# Patient Record
Sex: Male | Born: 1960 | Race: White | Hispanic: No | Marital: Single | Smoking: Current every day smoker
Health system: Southern US, Community
[De-identification: ages and names within clinical notes are randomized; demographics above are authoritative.]

## PROBLEM LIST (undated history)

## (undated) DIAGNOSIS — N200 Calculus of kidney: Secondary | ICD-10-CM

## (undated) DIAGNOSIS — J302 Other seasonal allergic rhinitis: Secondary | ICD-10-CM

## (undated) DIAGNOSIS — G43909 Migraine, unspecified, not intractable, without status migrainosus: Secondary | ICD-10-CM

## (undated) DIAGNOSIS — K219 Gastro-esophageal reflux disease without esophagitis: Secondary | ICD-10-CM

## (undated) DIAGNOSIS — K644 Residual hemorrhoidal skin tags: Secondary | ICD-10-CM

## (undated) DIAGNOSIS — I209 Angina pectoris, unspecified: Secondary | ICD-10-CM

## (undated) DIAGNOSIS — K5792 Diverticulitis of intestine, part unspecified, without perforation or abscess without bleeding: Secondary | ICD-10-CM

## (undated) DIAGNOSIS — R011 Cardiac murmur, unspecified: Secondary | ICD-10-CM

## (undated) DIAGNOSIS — M1990 Unspecified osteoarthritis, unspecified site: Secondary | ICD-10-CM

## (undated) DIAGNOSIS — F419 Anxiety disorder, unspecified: Secondary | ICD-10-CM

## (undated) DIAGNOSIS — I499 Cardiac arrhythmia, unspecified: Secondary | ICD-10-CM

## (undated) DIAGNOSIS — R0602 Shortness of breath: Secondary | ICD-10-CM

## (undated) DIAGNOSIS — R51 Headache: Secondary | ICD-10-CM

## (undated) DIAGNOSIS — J189 Pneumonia, unspecified organism: Secondary | ICD-10-CM

## (undated) DIAGNOSIS — R519 Headache, unspecified: Secondary | ICD-10-CM

## (undated) DIAGNOSIS — F32A Depression, unspecified: Secondary | ICD-10-CM

## (undated) DIAGNOSIS — F1910 Other psychoactive substance abuse, uncomplicated: Secondary | ICD-10-CM

## (undated) DIAGNOSIS — E785 Hyperlipidemia, unspecified: Secondary | ICD-10-CM

## (undated) DIAGNOSIS — F329 Major depressive disorder, single episode, unspecified: Secondary | ICD-10-CM

## (undated) HISTORY — DX: Calculus of kidney: N20.0

## (undated) HISTORY — PX: SHOULDER SURGERY: SHX246

## (undated) HISTORY — DX: Diverticulitis of intestine, part unspecified, without perforation or abscess without bleeding: K57.92

## (undated) HISTORY — PX: WISDOM TOOTH EXTRACTION: SHX21

## (undated) HISTORY — DX: Depression, unspecified: F32.A

## (undated) HISTORY — DX: Hyperlipidemia, unspecified: E78.5

## (undated) HISTORY — DX: Anxiety disorder, unspecified: F41.9

## (undated) HISTORY — DX: Other psychoactive substance abuse, uncomplicated: F19.10

---
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
## 1998-06-14 ENCOUNTER — Encounter: Payer: Self-pay | Admitting: *Deleted

## 1998-06-14 ENCOUNTER — Inpatient Hospital Stay (HOSPITAL_COMMUNITY): Admission: EM | Admit: 1998-06-14 | Discharge: 1998-06-18 | Payer: Self-pay | Admitting: *Deleted

## 1999-09-05 HISTORY — PX: APPENDECTOMY: SHX54

## 2000-01-04 ENCOUNTER — Emergency Department (HOSPITAL_COMMUNITY): Admission: EM | Admit: 2000-01-04 | Discharge: 2000-01-04 | Payer: Self-pay | Admitting: Emergency Medicine

## 2004-01-28 ENCOUNTER — Emergency Department (HOSPITAL_COMMUNITY): Admission: EM | Admit: 2004-01-28 | Discharge: 2004-01-28 | Payer: Self-pay | Admitting: *Deleted

## 2004-08-29 ENCOUNTER — Emergency Department (HOSPITAL_COMMUNITY): Admission: EM | Admit: 2004-08-29 | Discharge: 2004-08-30 | Payer: Self-pay | Admitting: Emergency Medicine

## 2004-09-06 ENCOUNTER — Emergency Department (HOSPITAL_COMMUNITY): Admission: EM | Admit: 2004-09-06 | Discharge: 2004-09-06 | Payer: Self-pay | Admitting: Emergency Medicine

## 2005-06-22 ENCOUNTER — Emergency Department (HOSPITAL_COMMUNITY): Admission: EM | Admit: 2005-06-22 | Discharge: 2005-06-22 | Payer: Self-pay | Admitting: Emergency Medicine

## 2006-01-03 ENCOUNTER — Encounter: Payer: Self-pay | Admitting: *Deleted

## 2011-01-10 ENCOUNTER — Emergency Department (HOSPITAL_COMMUNITY)
Admission: EM | Admit: 2011-01-10 | Discharge: 2011-01-10 | Disposition: A | Discharge status: Home / Self Care | Payer: Self-pay | Attending: Emergency Medicine | Admitting: Emergency Medicine

## 2011-01-10 DIAGNOSIS — M25519 Pain in unspecified shoulder: Secondary | ICD-10-CM | POA: Insufficient documentation

## 2011-01-25 ENCOUNTER — Ambulatory Visit (INDEPENDENT_AMBULATORY_CARE_PROVIDER_SITE_OTHER): Payer: Self-pay | Admitting: Family Medicine

## 2011-01-25 ENCOUNTER — Encounter: Payer: Self-pay | Admitting: Family Medicine

## 2011-01-25 VITALS — BP 134/95 | HR 69 | Ht 72.0 in | Wt 185.0 lb

## 2011-01-25 DIAGNOSIS — M25519 Pain in unspecified shoulder: Secondary | ICD-10-CM

## 2011-01-25 DIAGNOSIS — M25511 Pain in right shoulder: Secondary | ICD-10-CM

## 2011-01-25 NOTE — Progress Notes (Signed)
  Subjective:    Patient ID: Ethan Williams, male    DOB: 1961/06/25, 50 y.o.   MRN: [id number]  HPI 50 year old male new patient here for evaluation of right shoulder pain. 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 He states he's had shoulder pain on the right side on and off for many years. He thinks he has a history of subluxation on that side. His pain is generally anteriorly and laterally, and occasionally will wake him up at night. Usually a constant ache, but when he moves his arm out and behind him it will sometimes be a sharp stab. He thinks he may occasionally hear some clicking and popping. He is currently doing ibuprofen 800 mg every 6 hours as needed for pain. He has never done therapy. 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 Actually went to the emergency room recently, but did not get x-rays or pain medicine.  PMH: denies PSH: yes, but did not list Social: 40 pack/year history, recovered narcotic pain addict.  Unemployed, no insurance. All: NKDA Family Hx: HTN Meds: ibuprofen Review of Systems Denies fever, headaches, chills, sweats, weight loss, cough, hemoptysis, chest pain, abdominal pain.    Objective:   Physical Exam Gen. appearance: Well-appearing unkempt male with an odd affect Neck: Supple ENT moist mucous membranes Neuro alert and oriented Psych very out affect Lungs noted breathing Abdomen soft Right shoulder: Range of motion full with forward flexion abduction and external rotation. Internal rotation about L4. Normal rotator cuff strength. No tenderness over the supraspinatus tendon. Minimal tenderness over the biceps tendon, but definite tenderness in the anterior shoulder joint line. No obvious crepitus. No a.c. tenderness and negative cross adduction test. Mildly positive Hawkins and near test. Grossly positive O'Brien's test. Grossly positive apprehension test with mild relief on relocation. Negative sulcus  sign. Negative Clunk, mildly positive crank test. Negative speeds and Yergason's.       Assessment & Plan:  Chronic right shoulder pain with exacerbation over the last 2 weeks. Sounds like he may have a history of shoulder subluxation, and thus may have some chronic capsular and anterior labral pathology. In addition he may have some mild glenohumeral arthritis. - He declined any new anti-inflammatories or pain meds, he would like to continue ibuprofen and Tylenol as needed - Declined cortisone shot today -Check 3 views of the right shoulder mostly to assess for arthritis -Referral to physical therapy for range of motion, strength and decrease pain -follow up 1 month

## 2011-02-10 ENCOUNTER — Other Ambulatory Visit: Payer: Self-pay | Admitting: Family Medicine

## 2011-02-10 ENCOUNTER — Ambulatory Visit (HOSPITAL_COMMUNITY)
Admission: RE | Admit: 2011-02-10 | Discharge: 2011-02-10 | Disposition: A | Discharge status: Home / Self Care | Payer: Self-pay | Source: Ambulatory Visit | Attending: Sports Medicine | Admitting: Sports Medicine

## 2011-02-10 DIAGNOSIS — M25519 Pain in unspecified shoulder: Secondary | ICD-10-CM | POA: Insufficient documentation

## 2011-02-10 DIAGNOSIS — M25511 Pain in right shoulder: Secondary | ICD-10-CM

## 2011-02-10 DIAGNOSIS — M19019 Primary osteoarthritis, unspecified shoulder: Secondary | ICD-10-CM | POA: Insufficient documentation

## 2011-02-15 ENCOUNTER — Ambulatory Visit: Payer: Self-pay | Admitting: Family Medicine

## 2011-02-22 ENCOUNTER — Encounter: Payer: Self-pay | Admitting: Family Medicine

## 2011-02-22 ENCOUNTER — Ambulatory Visit (INDEPENDENT_AMBULATORY_CARE_PROVIDER_SITE_OTHER): Payer: Self-pay | Admitting: Family Medicine

## 2011-02-22 VITALS — BP 112/81

## 2011-02-22 DIAGNOSIS — M19019 Primary osteoarthritis, unspecified shoulder: Secondary | ICD-10-CM | POA: Insufficient documentation

## 2011-02-22 DIAGNOSIS — M25511 Pain in right shoulder: Secondary | ICD-10-CM

## 2011-02-22 DIAGNOSIS — M25519 Pain in unspecified shoulder: Secondary | ICD-10-CM

## 2011-02-22 DIAGNOSIS — M25819 Other specified joint disorders, unspecified shoulder: Secondary | ICD-10-CM

## 2011-02-22 NOTE — Progress Notes (Signed)
  Subjective:    Patient ID: Ethan Williams, male    DOB: 1961-03-26, 50 y.o.   MRN: [id number]  HPI 50 yo M f/u Rt shoulder.  Continues with anterior shoulder pain when working on his car.  Has h/o several prior subluxations, unsure if ever fully dislocated.  Here to go over x-rays.  Only using tylenol for pain and occasional advil.   Review of Systems No F, S, C    Objective:   Physical Exam Gen: NAD, very odd affect Rt shoulder: F flex 140 deg, abd 140 deg, ER 60 deg.  + apprehesion with pain, pain with sulcus test but no true sulcus sign.  Mild pain with anterior labral grind.  Mildly + Hawkin's/Neer's.  RC strength intact.  X-rays: AP int/ext views and Scap-Y view show ant GH osteophyte.  Type 2 acromion.  Ax Lat view not appropriately obtained.       Assessment & Plan:  Rt shoulder pain with h/o subluxation.  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 - declined CSI again today, will call back if changes his mind because of increased pain - set up for PT to focus on shoulder stability with RC strengthening - continue tylenol prn, declined other meds.  I would be hesitant to try much else given his h/o narcotic abuse. - f/u 1 month after several PT sessions, can call back sooner if wants [facility] CSI

## 2011-03-14 ENCOUNTER — Ambulatory Visit: Payer: Self-pay | Attending: Family Medicine | Admitting: Rehabilitative and Restorative Service Providers"

## 2011-03-14 DIAGNOSIS — M25519 Pain in unspecified shoulder: Secondary | ICD-10-CM | POA: Insufficient documentation

## 2011-03-14 DIAGNOSIS — IMO0001 Reserved for inherently not codable concepts without codable children: Secondary | ICD-10-CM | POA: Insufficient documentation

## 2011-03-14 DIAGNOSIS — R293 Abnormal posture: Secondary | ICD-10-CM | POA: Insufficient documentation

## 2011-03-14 DIAGNOSIS — M6281 Muscle weakness (generalized): Secondary | ICD-10-CM | POA: Insufficient documentation

## 2011-03-24 ENCOUNTER — Ambulatory Visit: Payer: Self-pay | Admitting: Family Medicine

## 2011-03-27 ENCOUNTER — Ambulatory Visit: Payer: Self-pay

## 2011-03-31 ENCOUNTER — Ambulatory Visit: Payer: Self-pay | Admitting: Family Medicine

## 2011-04-03 ENCOUNTER — Ambulatory Visit (INDEPENDENT_AMBULATORY_CARE_PROVIDER_SITE_OTHER): Payer: Self-pay | Admitting: Family Medicine

## 2011-04-03 VITALS — BP 129/82

## 2011-04-03 DIAGNOSIS — M25511 Pain in right shoulder: Secondary | ICD-10-CM

## 2011-04-03 DIAGNOSIS — M25519 Pain in unspecified shoulder: Secondary | ICD-10-CM

## 2011-04-03 NOTE — Patient Instructions (Signed)
Thank you for coming to see me today. He may have some more shoulder soreness tonight or tomorrow morning but that should wear off soon. If you have  very worsening pain, fevers or chills please let us know.  Please continue shoulder exercises that he learned in physical therapy Come back in about 4 weeks if you are all better you do not have to come back.

## 2011-04-03 NOTE — Progress Notes (Signed)
Ethan Williams presents to clinic today to followup his right shoulder pain.  He has had this pain since early May, and he has been managed conservatively with physical therapy and oral NSAIDs. He has resisted any glenohumeral or subacromial injection at this point.  He continues to have pain and does not feel like he is improving much despite the above therapy.  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  He also has pain when reaching back in reaching up.   PMH reviewed.  ROS as above otherwise neg  Exam:  BP 129/82 Gen: Well NAD Shoulder: Inspection reveals no abnormalities, atrophy or asymmetry. Palpation is normal with no tenderness over AC joint or bicipital groove. ROM is limited to abduction 160, for flexion full, external range of motion 85, internal range of motion 75. Rotator cuff strength normal throughout. Hawking's test is positive ears and empty can are negative Speeds and Yergason's tests normal. No painful arc and no drop arm sign.  INJECTION: Patient was given informed consent, signed copy in the chart. Appropriate time out was taken. Area prepped and draped in usual sterile fashion. 1 cc of kenalog plus  4 cc of lidocaine was injected into the right subacromial bursa using a(n) posterior approach. The patient tolerated the procedure well. There were no complications. Post procedure instructions were given.     A/P: Chronic right shoulder pain. He has not had much relief from physical therapy. He does have some signs of impingement. We will try subacromial injection today. If that does not improve his symptoms then he may be able to consider orthopedic referral.

## 2011-04-04 ENCOUNTER — Ambulatory Visit: Payer: Self-pay

## 2011-04-06 ENCOUNTER — Ambulatory Visit: Payer: Self-pay | Attending: Family Medicine

## 2011-04-06 DIAGNOSIS — M25519 Pain in unspecified shoulder: Secondary | ICD-10-CM | POA: Insufficient documentation

## 2011-04-06 DIAGNOSIS — R293 Abnormal posture: Secondary | ICD-10-CM | POA: Insufficient documentation

## 2011-04-06 DIAGNOSIS — IMO0001 Reserved for inherently not codable concepts without codable children: Secondary | ICD-10-CM | POA: Insufficient documentation

## 2011-04-06 DIAGNOSIS — M6281 Muscle weakness (generalized): Secondary | ICD-10-CM | POA: Insufficient documentation

## 2011-04-12 ENCOUNTER — Ambulatory Visit: Payer: Self-pay | Admitting: Physical Therapy

## 2011-04-13 ENCOUNTER — Ambulatory Visit: Payer: Self-pay | Admitting: Physical Therapy

## 2011-04-17 ENCOUNTER — Ambulatory Visit: Payer: Self-pay | Admitting: Family Medicine

## 2011-04-18 ENCOUNTER — Ambulatory Visit: Payer: Self-pay

## 2011-04-20 ENCOUNTER — Ambulatory Visit (INDEPENDENT_AMBULATORY_CARE_PROVIDER_SITE_OTHER): Payer: Self-pay | Admitting: Sports Medicine

## 2011-04-20 ENCOUNTER — Encounter: Payer: Self-pay | Admitting: Sports Medicine

## 2011-04-20 DIAGNOSIS — M25519 Pain in unspecified shoulder: Secondary | ICD-10-CM

## 2011-04-20 DIAGNOSIS — R51 Headache: Secondary | ICD-10-CM

## 2011-04-20 DIAGNOSIS — R519 Headache, unspecified: Secondary | ICD-10-CM | POA: Insufficient documentation

## 2011-04-20 DIAGNOSIS — M25511 Pain in right shoulder: Secondary | ICD-10-CM

## 2011-04-20 DIAGNOSIS — Z0000 Encounter for general adult medical examination without abnormal findings: Secondary | ICD-10-CM | POA: Insufficient documentation

## 2011-04-20 MED ORDER — NAPROXEN SODIUM 220 MG PO CAPS: 2.0000 | ORAL_CAPSULE | Freq: Two times a day (BID) | ORAL | Status: AC

## 2011-04-20 NOTE — Assessment & Plan Note (Signed)
Will discuss lipid screening and colonoscopy at next visit.

## 2011-04-20 NOTE — Progress Notes (Signed)
Subjective:  [name] is here today to establish care and to have his headaches evaluated.  He reports otherwise being healthy but has had persistent headaches over the past couple of years intermittently but seem to have gotten worse on the past month to 6 weeks.  In this time he has begun PT for a right shoulder injury and notices that the headaches are worse in the following days after PT.  He has been taking tylenol 500mg  up to 6 times daily with minimal relief.  He has not been able to find found anything else that seems to help them either.  Has not tried ice, heat, stretching or NSAIDS.  He describes the headaches a focally located in the right occipital region and has had some associated double vision when they are bad and a generalized feeling of dizziness although he does not report any vertigo or perceived motion.  The dizziness sounds to be more of a dysphoric feeling compared to a true vestibular or pre-syncopal sensation.  ROS:    Denies: Chest pain, dyspnea, diaphoresis; syncope, pre-syncope or vestibular symptoms.  Denies changes in hearing, smell or taste; + Double vision as above, Denies diarrhea; + constipation with occasional rectal bleeding after straining.  Otherwise no melana or bright red blood per rectum.  No dysuria, frequency.  No polydypsia or polyuria.    Physical Exam: GENERAL:  Middle aged  Caucasian male, examined in [facility].  Alert and interactive but with flattened affect and no eye contact and distant conversationalist.  In no distress. HNEENT: PERRLA, extra ocular movement intact and oropharynx clear, no lesions THORAX: HEART: S1, S2 normal, no murmur, rub or gallop, regular rate and rhythm LUNGS: clear to auscultation, no wheezes or rales and unlabored breathing ABDOMEN:  abdomen is soft without significant tenderness, masses, organomegaly or guarding EXTREMITIES: extremities normal, atraumatic, no cyanosis or edema >NEURO normal without focal findings, mental  status, speech normal, alert and oriented x3, PERLA and reflexes normal and symmetric >MSK/Osteopathic: R UE: + speeds test, negative empty can, would allow me to adequately examine the remaining shoulder for fear or further injury.   Cervical:  Tenderness over the Greater Occipital Nerve.  + Tinnel's there with mild scalp radiation.  Para spinal tenderness on R side; would not allow for full exam as not interested in receiving OMT at this time.   Ribs: elevated L 1st rib

## 2011-04-20 NOTE — Patient Instructions (Signed)
It was nice meeting you today.  Your headache is most likely due to some muscle soreness and inflammation in the upper part of your neck.  I would like you use heat on your neck for 10 minutes with stretching and follow up with ice to your neck for 15 mins after than.  Please take 2 Aleeve (naproxen) 220mg  tablets twice a day for the next 2 weeks and see how you feel.  Please return in 1 month so we can continue discussing your medical care.

## 2011-04-20 NOTE — Assessment & Plan Note (Signed)
Continue PT and try, ICE with NSAID

## 2011-04-20 NOTE — Assessment & Plan Note (Signed)
Given directions for taking Aleeve 2 X 220mg  po bid X 14 days.  Try heat and stretching followed by ice.. OMT declined at this time

## 2011-04-25 ENCOUNTER — Ambulatory Visit: Payer: Self-pay

## 2011-04-27 ENCOUNTER — Ambulatory Visit: Payer: Self-pay

## 2011-05-03 ENCOUNTER — Ambulatory Visit: Payer: Self-pay

## 2011-05-05 ENCOUNTER — Ambulatory Visit (INDEPENDENT_AMBULATORY_CARE_PROVIDER_SITE_OTHER): Payer: Self-pay | Admitting: Family Medicine

## 2011-05-05 VITALS — BP 120/70

## 2011-05-05 DIAGNOSIS — M25511 Pain in right shoulder: Secondary | ICD-10-CM

## 2011-05-05 DIAGNOSIS — M25519 Pain in unspecified shoulder: Secondary | ICD-10-CM

## 2011-05-08 ENCOUNTER — Encounter: Payer: Self-pay | Admitting: Family Medicine

## 2011-05-08 NOTE — Progress Notes (Signed)
  Subjective:    Patient ID: Ethan Williams, male    DOB: Jan 06, 1961, 49 y.o.   MRN: [id number]  HPI Followup right shoulder pain. He has continued in physical therapy and feels his strength in his range of motion is much better. He still has pain with certain motions particularly crossing the arm over across his chest and doing anything above his head. He had no numbness in his hand.  Corticosteroid injection did not seem to help much more than 2 days. Review of Systems    denies any new symptoms in the right upper extremity. No fever weight loss. Objective:   Physical Exam   Right shoulder has full range of motion. He has positive O'Brien's and positive impingement signs. Distally he is neurovascularly intact.     Assessment & Plan:  Continued shoulder pain but much improved range of motion and strength. I would like to continue his physical therapy for another 2-3 weeks and see him back. The injection did not help him.

## 2011-05-19 ENCOUNTER — Emergency Department (HOSPITAL_COMMUNITY)
Admission: EM | Admit: 2011-05-19 | Discharge: 2011-05-19 | Discharge status: Against Medical Advice | Payer: Self-pay | Attending: Emergency Medicine | Admitting: Emergency Medicine

## 2011-05-19 DIAGNOSIS — R079 Chest pain, unspecified: Secondary | ICD-10-CM | POA: Insufficient documentation

## 2011-05-19 DIAGNOSIS — R21 Rash and other nonspecific skin eruption: Secondary | ICD-10-CM | POA: Insufficient documentation

## 2011-05-20 ENCOUNTER — Emergency Department (HOSPITAL_COMMUNITY): Payer: Self-pay

## 2011-05-20 ENCOUNTER — Emergency Department (HOSPITAL_COMMUNITY)
Admission: EM | Admit: 2011-05-20 | Discharge: 2011-05-20 | Disposition: A | Discharge status: Home / Self Care | Payer: Self-pay | Attending: Emergency Medicine | Admitting: Emergency Medicine

## 2011-05-20 DIAGNOSIS — R079 Chest pain, unspecified: Secondary | ICD-10-CM | POA: Insufficient documentation

## 2011-05-20 DIAGNOSIS — F411 Generalized anxiety disorder: Secondary | ICD-10-CM | POA: Insufficient documentation

## 2011-05-20 DIAGNOSIS — R51 Headache: Secondary | ICD-10-CM | POA: Insufficient documentation

## 2011-05-20 DIAGNOSIS — R0602 Shortness of breath: Secondary | ICD-10-CM | POA: Insufficient documentation

## 2011-05-20 DIAGNOSIS — J189 Pneumonia, unspecified organism: Secondary | ICD-10-CM | POA: Insufficient documentation

## 2011-05-20 LAB — COMPREHENSIVE METABOLIC PANEL
ALT: 29 U/L (ref 0–53)
Alkaline Phosphatase: 67 U/L (ref 39–117)
BUN: 12 mg/dL (ref 6–23)
CO2: 30 mEq/L (ref 19–32)
Chloride: 100 mEq/L (ref 96–112)
GFR calc Af Amer: >60 mL/min (ref >60)
Glucose, Bld: 116 mg/dL — ABNORMAL HIGH (ref 70–99)
Potassium: 4.6 mEq/L (ref 3.5–5.1)
Sodium: 138 mEq/L (ref 135–145)
Total Bilirubin: 0.2 mg/dL — ABNORMAL LOW (ref 0.3–1.2)

## 2011-05-20 LAB — POCT I-STAT TROPONIN I: Troponin i, poc: 0.01 ng/mL (ref 0.00–0.08)

## 2011-05-20 LAB — DIFFERENTIAL
Basophils Relative: 0 % (ref 0–1)
Eosinophils Absolute: 0.3 10*3/uL (ref 0.0–0.7)
Eosinophils Relative: 3 % (ref 0–5)
Monocytes Absolute: 0.7 10*3/uL (ref 0.1–1.0)
Monocytes Relative: 8 % (ref 3–12)
Neutrophils Relative %: 58 % (ref 43–77)

## 2011-05-20 LAB — CBC
MCH: 29.3 pg (ref 26.0–34.0)
MCHC: 34 g/dL (ref 30.0–36.0)
MCV: 86.1 fL (ref 78.0–100.0)
Platelets: 245 10*3/uL (ref 150–400)
RBC: 5.4 MIL/uL (ref 4.22–5.81)
RDW: 13.5 % (ref 11.5–15.5)

## 2011-05-22 ENCOUNTER — Encounter: Payer: Self-pay | Admitting: Sports Medicine

## 2011-05-22 ENCOUNTER — Ambulatory Visit (INDEPENDENT_AMBULATORY_CARE_PROVIDER_SITE_OTHER): Payer: Self-pay | Admitting: Sports Medicine

## 2011-05-22 VITALS — BP 150/95 | HR 80 | Temp 97.5°F | Ht 66.0 in | Wt 197.3 lb

## 2011-05-22 DIAGNOSIS — Z0130 Encounter for examination of blood pressure without abnormal findings: Secondary | ICD-10-CM | POA: Insufficient documentation

## 2011-05-22 DIAGNOSIS — IMO0001 Reserved for inherently not codable concepts without codable children: Secondary | ICD-10-CM

## 2011-05-22 DIAGNOSIS — K029 Dental caries, unspecified: Secondary | ICD-10-CM | POA: Insufficient documentation

## 2011-05-22 DIAGNOSIS — R51 Headache: Secondary | ICD-10-CM

## 2011-05-22 DIAGNOSIS — Z23 Encounter for immunization: Secondary | ICD-10-CM

## 2011-05-22 DIAGNOSIS — R030 Elevated blood-pressure reading, without diagnosis of hypertension: Secondary | ICD-10-CM

## 2011-05-22 DIAGNOSIS — J189 Pneumonia, unspecified organism: Secondary | ICD-10-CM

## 2011-05-22 DIAGNOSIS — M25819 Other specified joint disorders, unspecified shoulder: Secondary | ICD-10-CM

## 2011-05-22 DIAGNOSIS — M19019 Primary osteoarthritis, unspecified shoulder: Secondary | ICD-10-CM

## 2011-05-22 DIAGNOSIS — Z0000 Encounter for general adult medical examination without abnormal findings: Secondary | ICD-10-CM

## 2011-05-22 NOTE — Assessment & Plan Note (Signed)
Will refer to dental via [facility] card

## 2011-05-22 NOTE — Assessment & Plan Note (Signed)
Dx @ [facility] ED. Finish Azithromycin. Improving clinically

## 2011-05-22 NOTE — Patient Instructions (Signed)
It was great to see you today.  I am glad that you are feeling better.  We discussed your shoulder pain today and you said that you have been able to do your exercises 2-3 times per week.  This is great but as you pointed out if you were able to do them more you will likely get more strength and increase your range of motion quicker and decrease your pain even more than it is now.  Keep taking the Aleeve for now but if you feel like you are able to ween off of it that would be fine.  If you itching or your rash comes back try taking an over the counter Loratadine (Claratin) as directed on the package.  This will be better than the benadryl for not causing any drowsiness.  Please call next week to schedule a nurse visit to have your blood drawn for a fasting lipid panel to check you cholesterol and your triglycerides.  We will also check your BP at this nurse visit.  We will work on getting your reffered to [facility] for a conolonscopy although there is a waiting list.  We will also refer to the dentist on the [facility] card.  Please come back in 3 months so we can follow up with each other to see how your problems are doing.

## 2011-05-22 NOTE — Assessment & Plan Note (Addendum)
Will refer for screening colonoscopy per [facility] card Fasting lipid panel ordered

## 2011-05-22 NOTE — Assessment & Plan Note (Deleted)
Continue 2 Aleeve po bid for now but try self directed weening. States he will continue exercises 2-3Xs per week. Understands we would like him to do daily.   Not interested in OMT

## 2011-05-22 NOTE — Progress Notes (Addendum)
Pt here for follow up to discuss Colonoscopy, Dental Work, a resolving rash with change in bowel habits and his ongoing shoulder pain.  He was also just recently seen in the ED 3 days prior to the visit and was dx with CAP and placed on Azithromycin.   CAP - symptoms improved since initiation of ABX.  Was having frequent substernal chest pain.  Was found to have a L basilar infiltrate/atelectasis and negative ECG/CEs.  Azithromycin has greatly reduced his symptoms and is now only having ~ 1 episode of chest pain that last 10-15 seconds.    Rash/Bowel Changes:  He reports that he first starting noticing a slight changing of of his stools ~2 weeks ago followed by the appearance of a rash.  He stated the rash was pruritic and that he ended up needing to cut his hair so that he could apply anti-itch cream to his scalp.  He did not try any other meds consistently but reported using benadryl X 1 but was hesitant to use it because he didn't know if he should.  The rash has improved at this time and his loose stools had improved but have now worsened again following initiation of the anti-biotics for his CAP.  No hematachezia or melana.  Headaches/Shoulder Pain:  Much improved, states that ROM is improved and that it is less limiting.  Headaches have decreased in frequency and duration.  Been compliant with OTC Aleeve 2 pills BID.  Had shoulder injected at [facility] and also showed some improvement.  Has been doing exercsises 2-3Xs / week.    Health Maintance:  Would like referral for colonoscopy and dental work through [facility] card.    Dental Caries:  States that he has been having some dental pain that has worsened over the last couple of months.  PE: GENERAL:  adult Caucasian male, examined in [facility].  Alert and appropriate.  In no discomfort; no respiratory distress. HNEENT: PERRLA, extra ocular movement intact, sclera clear, anicteric, oropharynx clear, no lesions and dental fillings noted with multiple areas of  visible caries on upper and lower molars bilaterally; no dental abcess apprecaited THORAX: HEART: S1, S2 normal, no murmur, rub or gallop, regular rate and rhythm LUNGS: clear to auscultation, no wheezes or rales and unlabored breathing ABDOMEN:  abdomen is soft without significant tenderness, masses, organomegaly or guarding EXTREMITIES: warm well perfused, no cyanosis, or edema; shoulder ROM equal B with exception of R arm internal rotation (T4 on L vs T8 on R);  Strength 5+/5 in UE with exception of R shoulder extension @ 5-/

## 2011-05-22 NOTE — Assessment & Plan Note (Signed)
Will recheck at nurse visit when comes in for fasting lipid panel

## 2011-05-22 NOTE — Assessment & Plan Note (Signed)
Continue 2 Aleeve po bid for now but try self directed weening. States he will continue exercises 2-3Xs per week. Understands we would like him to do daily.   Not interested in OMT 

## 2011-06-05 ENCOUNTER — Other Ambulatory Visit: Payer: Self-pay

## 2011-06-05 ENCOUNTER — Ambulatory Visit (INDEPENDENT_AMBULATORY_CARE_PROVIDER_SITE_OTHER): Payer: Self-pay | Admitting: Sports Medicine

## 2011-06-05 ENCOUNTER — Encounter: Payer: Self-pay | Admitting: Sports Medicine

## 2011-06-05 VITALS — BP 129/83 | HR 74 | Temp 98.2°F | Ht 72.0 in | Wt 195.0 lb

## 2011-06-05 DIAGNOSIS — M9908 Segmental and somatic dysfunction of rib cage: Secondary | ICD-10-CM | POA: Insufficient documentation

## 2011-06-05 DIAGNOSIS — R079 Chest pain, unspecified: Secondary | ICD-10-CM | POA: Insufficient documentation

## 2011-06-05 DIAGNOSIS — M25511 Pain in right shoulder: Secondary | ICD-10-CM

## 2011-06-05 DIAGNOSIS — M9902 Segmental and somatic dysfunction of thoracic region: Secondary | ICD-10-CM

## 2011-06-05 DIAGNOSIS — M25519 Pain in unspecified shoulder: Secondary | ICD-10-CM

## 2011-06-05 DIAGNOSIS — M9907 Segmental and somatic dysfunction of upper extremity: Secondary | ICD-10-CM | POA: Insufficient documentation

## 2011-06-05 DIAGNOSIS — Z0000 Encounter for general adult medical examination without abnormal findings: Secondary | ICD-10-CM

## 2011-06-05 DIAGNOSIS — M999 Biomechanical lesion, unspecified: Secondary | ICD-10-CM

## 2011-06-05 DIAGNOSIS — F17200 Nicotine dependence, unspecified, uncomplicated: Secondary | ICD-10-CM | POA: Insufficient documentation

## 2011-06-05 DIAGNOSIS — R030 Elevated blood-pressure reading, without diagnosis of hypertension: Secondary | ICD-10-CM

## 2011-06-05 LAB — LIPID PANEL
Cholesterol: 194 mg/dL (ref 0–200)
Total CHOL/HDL Ratio: 5.7 Ratio

## 2011-06-05 MED ORDER — MELOXICAM 15 MG PO TABS: 15.0000 mg | ORAL_TABLET | Freq: Every day | ORAL | Status: DC

## 2011-06-05 NOTE — Progress Notes (Signed)
Items to discuss today:  Right sided chest pain & R shoulder pain, BP and smoking  R Sided Chest and Shoulder pain:  He reports some worsening of symptoms over the past 2 weeks and is now having on average 2 episodes per day of a dull right sided chest pain that is stabbing like on occasion; does have some R epigastric radiation.  These episodes last anywhere from a couple of minutes to greater than 1 hour.  He reports some diaphoresis and dyspnea with these episodes.  Occurs both at rest and during exercise but not necessarily worsened with exertion.  Massage and stretching help relieve these episodes although he had some longer episodes that these techniques do not alleviate.  Has had prior stress test years ago that was negative.  Is a current smoker.  Not hypertensive or diabetic and lipid panel is pending.  No past cardiac history.  ECG performed 9/15 showed no acute ECG changes.    BP: is good today; relates his prior elevated readings to his prior pain and situational anxiety related to being at the doctors office  Smoking:  Would like to quit but not ready too yet; has not set quit date but considering it.    PE: GENERAL:  Middle aged  Caucasian male, examined in [facility].  Alert and Appropriately Interactive.  In no discomfort; no respiratory distress. HNEENT: PERRLA, extra ocular movement intact and sclera clear, anicteric THORAX: HEART: S1, S2 normal, no murmur, rub or gallop, regular rate and rhythm LUNGS: clear to auscultation, no wheezes or rales and unlabored breathing EXTREMITIES: extremities normal, atraumatic, no cyanosis or edema  MSK/Osteopathic:    Thoracic & Ribs:  Posterior R Rib 5 with associated Anterior tender point; T2-T5  NSLRR (neutral sidebent left rotated right)  R UE:  Shoulder decreased ROM; exam limited by pain no motion restriction appreciated with onset of pain other than apprehension

## 2011-06-05 NOTE — Patient Instructions (Signed)
Great to see you again today.  I am reassured that your chest pain is not coming from your heart and that it is due to a musculetoskeletal cause.  However, I do feel that you are a good candidate to do an exercise stress test to make sure your heart is not under any strain.  I also want to start you on Mobic for your shoulder and to help with your chest pain.  It is waiting at [facility] for you.  Please follow up with Dr. Jennette Kettle in sports medicine for consideration of another injection and or more physical therapy.  Please follow up with me in 3 months as previously discussed.  Please have the front desk schedule a treadmill stress test with either Dr. Jennette Kettle, McDiarmid or Fields.

## 2011-06-05 NOTE — Assessment & Plan Note (Signed)
OMT Performed to UE, Thorax and Ribs; CS, indirect MFR Pt tolerated procedure well although did have 1 intracostal muscle spasm with attempt of Thoracic ME

## 2011-06-05 NOTE — Assessment & Plan Note (Signed)
Not felt to be Hypertension; situational; continue to monitor

## 2011-06-05 NOTE — Progress Notes (Signed)
FLP DONE TODAY Saarah Dewing 

## 2011-06-05 NOTE — Assessment & Plan Note (Signed)
Discussed the availability of resources to help him quite when he decides he is ready

## 2011-06-05 NOTE — Assessment & Plan Note (Signed)
Due to character of pain and atypical presentation for cardiac origin and corroborating musculoskeletal symptomatolgy this is a low likely hood of cardiac chest pain.  However due to the duration of the symptoms and the associated diaphoresis and dyspnea with occasional activity associate it is felt that he would benefit from an exercise stress test to rule out exertional angina.  He is a relative low risk although he does have a positive smoking history; no DM, HTN or dyslipidemia.  There are no other contraindications to the procedure.  Baseline ECG from sept 15 showed no acute changes.

## 2011-06-12 ENCOUNTER — Ambulatory Visit: Payer: Self-pay | Admitting: Family Medicine

## 2011-06-19 ENCOUNTER — Ambulatory Visit (INDEPENDENT_AMBULATORY_CARE_PROVIDER_SITE_OTHER): Payer: Self-pay | Admitting: Family Medicine

## 2011-06-19 ENCOUNTER — Encounter: Payer: Self-pay | Admitting: Family Medicine

## 2011-06-19 VITALS — BP 134/90 | HR 92

## 2011-06-19 DIAGNOSIS — M25511 Pain in right shoulder: Secondary | ICD-10-CM

## 2011-06-19 DIAGNOSIS — M25519 Pain in unspecified shoulder: Secondary | ICD-10-CM

## 2011-06-19 NOTE — Patient Instructions (Signed)
F/u prn

## 2011-06-19 NOTE — Progress Notes (Signed)
  Subjective:    Patient ID: Ethan Williams, male    DOB: 1961-05-23, 50 y.o.   MRN: [id number]  HPI  Right shoulder pain. Significantly improved after the physical therapy. He was working on his car a few days later and had an exacerbation of his pain. This time the pain exacerbation the last 3 days in he was back to his post therapy baseline. He is quite excited about this.  Review of Systems    denies fever, denies unexpected weight change. No numbness in his hands. Objective:   Physical Exam GENERAL: Well-developed male no acute distress SHOULDER : Right. Full range of motion in all planes of the rotator cuff. Intact rotatory cuff strength in all planes. Distally neurovascular intact. Negative apprehension test.       Assessment & Plan:  Chronic right shoulder pain it seems significantly improved after physical therapy. I  reminded him to do his exercises 3 times a week to prevent recurrence. He can followup when necessary with me.

## 2011-12-14 ENCOUNTER — Emergency Department (HOSPITAL_COMMUNITY): Payer: Self-pay

## 2011-12-14 ENCOUNTER — Emergency Department (HOSPITAL_COMMUNITY)
Admission: EM | Admit: 2011-12-14 | Discharge: 2011-12-14 | Disposition: A | Discharge status: Home / Self Care | Payer: Self-pay | Attending: Emergency Medicine | Admitting: Emergency Medicine

## 2011-12-14 ENCOUNTER — Encounter (HOSPITAL_COMMUNITY): Payer: Self-pay | Admitting: *Deleted

## 2011-12-14 DIAGNOSIS — R51 Headache: Secondary | ICD-10-CM | POA: Insufficient documentation

## 2011-12-14 DIAGNOSIS — D72829 Elevated white blood cell count, unspecified: Secondary | ICD-10-CM | POA: Insufficient documentation

## 2011-12-14 DIAGNOSIS — H538 Other visual disturbances: Secondary | ICD-10-CM | POA: Insufficient documentation

## 2011-12-14 DIAGNOSIS — F17200 Nicotine dependence, unspecified, uncomplicated: Secondary | ICD-10-CM | POA: Insufficient documentation

## 2011-12-14 DIAGNOSIS — F29 Unspecified psychosis not due to a substance or known physiological condition: Secondary | ICD-10-CM | POA: Insufficient documentation

## 2011-12-14 DIAGNOSIS — R079 Chest pain, unspecified: Secondary | ICD-10-CM | POA: Insufficient documentation

## 2011-12-14 LAB — DIFFERENTIAL
Eosinophils Absolute: 0.1 10*3/uL (ref 0.0–0.7)
Eosinophils Relative: 1 % (ref 0–5)
Lymphocytes Relative: 21 % (ref 12–46)
Lymphs Abs: 2.5 10*3/uL (ref 0.7–4.0)
Monocytes Relative: 7 % (ref 3–12)
Neutrophils Relative %: 71 % (ref 43–77)

## 2011-12-14 LAB — COMPREHENSIVE METABOLIC PANEL
AST: 16 U/L (ref 0–37)
Albumin: 4.2 g/dL (ref 3.5–5.2)
Alkaline Phosphatase: 51 U/L (ref 39–117)
Chloride: 98 mEq/L (ref 96–112)
Potassium: 3.7 mEq/L (ref 3.5–5.1)
Total Bilirubin: 0.5 mg/dL (ref 0.3–1.2)

## 2011-12-14 LAB — CBC
Hemoglobin: 14.6 g/dL (ref 13.0–17.0)
MCH: 28.6 pg (ref 26.0–34.0)
MCV: 84.3 fL (ref 78.0–100.0)
Platelets: 292 10*3/uL (ref 150–400)
RBC: 5.11 MIL/uL (ref 4.22–5.81)
WBC: 12.3 10*3/uL — ABNORMAL HIGH (ref 4.0–10.5)

## 2011-12-14 MED ORDER — LORAZEPAM 2 MG/ML IJ SOLN
1.0000 mg | Freq: Once | INTRAMUSCULAR | Status: AC
  Administered 2011-12-14: 1 mg via INTRAVENOUS
  Filled 2011-12-14: qty 1

## 2011-12-14 MED ORDER — BUTALBITAL-APAP-CAFFEINE 50-325-40 MG PO TABS: 1.0000 | ORAL_TABLET | Freq: Four times a day (QID) | ORAL | Status: DC | PRN

## 2011-12-14 NOTE — ED Provider Notes (Signed)
History     CSN: [id number]  Arrival date & time 12/14/11  1441   First MD Initiated Contact with Patient 12/14/11 1558      Chief Complaint  Patient presents with  . Blurred Vision    h/a, slurred speech    (Consider location/radiation/quality/duration/timing/severity/associated sxs/prior treatment) HPI The patient presents with concern over a headache, chest pain, blurred vision.  He notes that the symptoms began gradually 2 days ago.  He was watching a movie, felt unwell and stepped outside to rest.  The headache and chest pain eased spontaneously over the next few hours.  The blurred vision persisted, though is improved today.  He denies any ongoing dyspnea, ataxia, discoordination, visual acuity loss. No new medications, no new activities, no new diet. The patient has a history of multiple prior similar events, occurring with spontaneous onset every couple of months.  He's never been evaluated for these.  He is a smoker, and former drinker, former substance abuser.   History reviewed. No pertinent past medical history.  Past Surgical History  Procedure Date  . Appendectomy 2001    Family History  Problem Relation Age of Onset  . Cancer Father     History  Substance Use Topics  . Smoking status: Current Everyday Smoker -- 1.0 packs/day for 37 years    Types: Cigarettes  . Smokeless tobacco: Not on file  . Alcohol Use: No     History of abuse - clean 2+ years      Review of Systems  Constitutional:       Per HPI, otherwise negative  HENT:       Per HPI, otherwise negative  Eyes: Negative.   Respiratory:       Per HPI, otherwise negative  Cardiovascular:       Per HPI, otherwise negative  Gastrointestinal: Negative for vomiting.  Genitourinary: Negative.   Musculoskeletal:       Per HPI, otherwise negative  Skin: Negative.   Neurological: Negative for syncope.    Allergies  Review of patient's allergies indicates no known allergies.  Home Medications     Current Outpatient Rx  Name Route Sig Dispense Refill  . ACETAMINOPHEN 500 MG PO TABS Oral Take 500 mg by mouth every 4 (four) hours as needed.        BP 119/75  Pulse 77  Temp(Src) 98.2 F (36.8 C) (Oral)  Resp 16  Wt 190 lb (86.183 kg)  SpO2 98%  Physical Exam  Nursing note and vitals reviewed. Constitutional: He is oriented to person, place, and time. He appears well-developed. No distress.  HENT:  Head: Normocephalic and atraumatic.  Eyes: Conjunctivae and EOM are normal.  Cardiovascular: Normal rate and regular rhythm.   Pulmonary/Chest: Effort normal. No stridor. No respiratory distress.  Abdominal: He exhibits no distension.  Musculoskeletal: He exhibits no edema.  Neurological: He is alert and oriented to person, place, and time. He has normal strength. No cranial nerve deficit or sensory deficit. Coordination and gait normal.  Skin: Skin is warm and dry.  Psychiatric: He has a normal mood and affect.    ED Course  Procedures (including critical care time)   Labs Reviewed  CBC  DIFFERENTIAL  COMPREHENSIVE METABOLIC PANEL  TROPONIN I   No results found.   No diagnosis found.   monitor 75 sinus rhythm normal Pulse oximetry 98% room air normal  5:08 PM I discussed the CT w Dr. Constance Goltz (radiology).  Today's study is different from baseline.  The  patient will have MR / MRA.  7:45 PM I informed the patient of the negative MR I. results, we discussed the possibilities of his condition, including migraines.  At this point, and never previously, the patient noted that he had previously been told he might have migraine headaches.  He has no new complaints. MDM  This patient presents with concern over blurred vision, headache.  On my exam patient is in no distress with no discernible neurologic deficiencies.  The patient initially denies any history of migraines, other notable medical problems.  However, given the patient's endorsement of intermittent similar  episodes migraine headache was in early consideration.  Given the absence of significant prior evaluation the patient had CAT scan, which was abnormal.  A followup MRI/MRA was unremarkable.  The patient's labs were otherwise reassuring aside from mild leukocytosis.  On repeat evaluation the patient has a symptomatic, laying comfortably.  We discussed the need for continued evaluation via neurology.  The patient noted that he had previously been told he may have migraine headaches, though he's not currently taking any medication for these.  The patient was discharged in stable condition to follow up with neurology.    Gerhard Munch, MD 12/14/11 [phone]

## 2011-12-14 NOTE — ED Notes (Signed)
Pt reports blurred vision, h/a, and slurred vision x 2 days ago.  Pt reports this has happened before.  Pt's speech is clear at present.  Pt reports "light" vision, and lightheadedness at this time.

## 2011-12-14 NOTE — Progress Notes (Signed)
Pt confirms [employer] [facility] [employer] as pcp Pt listed as self pay with no insurance coverage Pt confirms he is self pay [place] resident CM and [facility] community liaison spoke with him Pt offered [facility] services to assist with finding a [place] self pay provider Pt accepted information

## 2011-12-14 NOTE — ED Notes (Signed)
States blurred vision resolved-would like to have his headaches evaluated

## 2012-06-06 ENCOUNTER — Observation Stay (HOSPITAL_COMMUNITY)
Admission: EM | Admit: 2012-06-06 | Discharge: 2012-06-07 | Disposition: A | Discharge status: Home / Self Care | Payer: Self-pay | Attending: Family Medicine | Admitting: Family Medicine

## 2012-06-06 ENCOUNTER — Emergency Department (HOSPITAL_COMMUNITY): Payer: Self-pay

## 2012-06-06 ENCOUNTER — Encounter (HOSPITAL_COMMUNITY): Payer: Self-pay | Admitting: Emergency Medicine

## 2012-06-06 DIAGNOSIS — Z7982 Long term (current) use of aspirin: Secondary | ICD-10-CM | POA: Insufficient documentation

## 2012-06-06 DIAGNOSIS — R079 Chest pain, unspecified: Secondary | ICD-10-CM

## 2012-06-06 DIAGNOSIS — R002 Palpitations: Secondary | ICD-10-CM

## 2012-06-06 DIAGNOSIS — R0602 Shortness of breath: Secondary | ICD-10-CM | POA: Insufficient documentation

## 2012-06-06 DIAGNOSIS — F17200 Nicotine dependence, unspecified, uncomplicated: Secondary | ICD-10-CM

## 2012-06-06 DIAGNOSIS — M940 Chondrocostal junction syndrome [Tietze]: Secondary | ICD-10-CM

## 2012-06-06 DIAGNOSIS — Z23 Encounter for immunization: Secondary | ICD-10-CM | POA: Insufficient documentation

## 2012-06-06 DIAGNOSIS — K625 Hemorrhage of anus and rectum: Secondary | ICD-10-CM | POA: Insufficient documentation

## 2012-06-06 DIAGNOSIS — M19019 Primary osteoarthritis, unspecified shoulder: Secondary | ICD-10-CM

## 2012-06-06 DIAGNOSIS — R0789 Other chest pain: Principal | ICD-10-CM | POA: Insufficient documentation

## 2012-06-06 HISTORY — DX: Pneumonia, unspecified organism: J18.9

## 2012-06-06 HISTORY — DX: Shortness of breath: R06.02

## 2012-06-06 HISTORY — DX: Headache: R51

## 2012-06-06 HISTORY — DX: Other seasonal allergic rhinitis: J30.2

## 2012-06-06 HISTORY — DX: Headache, unspecified: R51.9

## 2012-06-06 HISTORY — DX: Angina pectoris, unspecified: I20.9

## 2012-06-06 HISTORY — DX: Gastro-esophageal reflux disease without esophagitis: K21.9

## 2012-06-06 HISTORY — DX: Cardiac murmur, unspecified: R01.1

## 2012-06-06 HISTORY — DX: Cardiac arrhythmia, unspecified: I49.9

## 2012-06-06 HISTORY — DX: Unspecified osteoarthritis, unspecified site: M19.90

## 2012-06-06 HISTORY — DX: Migraine, unspecified, not intractable, without status migrainosus: G43.909

## 2012-06-06 HISTORY — DX: Residual hemorrhoidal skin tags: K64.4

## 2012-06-06 LAB — TROPONIN I: Troponin I: <0.3 ng/mL (ref <0.30)

## 2012-06-06 LAB — CBC
HCT: 44.2 % (ref 39.0–52.0)
MCH: 29.8 pg (ref 26.0–34.0)
MCHC: 35.5 g/dL (ref 30.0–36.0)
MCV: 83.9 fL (ref 78.0–100.0)
Platelets: 242 10*3/uL (ref 150–400)
RDW: 13.7 % (ref 11.5–15.5)
WBC: 11.5 10*3/uL — ABNORMAL HIGH (ref 4.0–10.5)

## 2012-06-06 LAB — CBC WITH DIFFERENTIAL/PLATELET
Basophils Absolute: 0.1 10*3/uL (ref 0.0–0.1)
Basophils Relative: 1 % (ref 0–1)
Eosinophils Relative: 2 % (ref 0–5)
HCT: 45.3 % (ref 39.0–52.0)
MCHC: 35.1 g/dL (ref 30.0–36.0)
MCV: 83.6 fL (ref 78.0–100.0)
Monocytes Absolute: 1.3 10*3/uL — ABNORMAL HIGH (ref 0.1–1.0)
RDW: 13.6 % (ref 11.5–15.5)

## 2012-06-06 LAB — CREATININE, SERUM: GFR calc Af Amer: >90 mL/min (ref >90)

## 2012-06-06 LAB — RAPID URINE DRUG SCREEN, HOSP PERFORMED
Barbiturates: NOT DETECTED
Cocaine: NOT DETECTED
Tetrahydrocannabinol: NOT DETECTED

## 2012-06-06 LAB — BASIC METABOLIC PANEL
CO2: 24 mEq/L (ref 19–32)
Calcium: 9.3 mg/dL (ref 8.4–10.5)
Creatinine, Ser: 0.9 mg/dL (ref 0.50–1.35)
GFR calc Af Amer: >90 mL/min (ref >90)

## 2012-06-06 LAB — D-DIMER, QUANTITATIVE: D-Dimer, Quant: <0.27 ug/mL-FEU (ref 0.00–0.48)

## 2012-06-06 MED ORDER — ONDANSETRON HCL 4 MG/2ML IJ SOLN: 4.0000 mg | Freq: Three times a day (TID) | INTRAMUSCULAR | Status: AC | PRN

## 2012-06-06 MED ORDER — HEPARIN SODIUM (PORCINE) 5000 UNIT/ML IJ SOLN
5000.0000 [IU] | Freq: Three times a day (TID) | INTRAMUSCULAR | Status: DC
  Administered 2012-06-06 – 2012-06-07 (×2): 5000 [IU] via SUBCUTANEOUS
  Filled 2012-06-06 (×5): qty 1

## 2012-06-06 MED ORDER — INFLUENZA VIRUS VACC SPLIT PF IM SUSP
0.5000 mL | INTRAMUSCULAR | Status: AC
  Administered 2012-06-07: 0.5 mL via INTRAMUSCULAR
  Filled 2012-06-06: qty 0.5

## 2012-06-06 MED ORDER — PNEUMOCOCCAL VAC POLYVALENT 25 MCG/0.5ML IJ INJ
0.5000 mL | INJECTION | INTRAMUSCULAR | Status: AC
  Administered 2012-06-07: 0.5 mL via INTRAMUSCULAR
  Filled 2012-06-06: qty 0.5

## 2012-06-06 MED ORDER — NITROGLYCERIN 0.4 MG SL SUBL: 0.4000 mg | SUBLINGUAL_TABLET | SUBLINGUAL | Status: DC | PRN

## 2012-06-06 MED ORDER — ACETAMINOPHEN 325 MG PO TABS
650.0000 mg | ORAL_TABLET | ORAL | Status: DC | PRN
  Administered 2012-06-06 – 2012-06-07 (×2): 650 mg via ORAL
  Filled 2012-06-06 (×2): qty 2

## 2012-06-06 MED ORDER — NICOTINE 7 MG/24HR TD PT24
7.0000 mg | MEDICATED_PATCH | Freq: Once | TRANSDERMAL | Status: DC
Start: 2012-06-06 — End: 2012-06-06
  Filled 2012-06-06: qty 1

## 2012-06-06 MED ORDER — SODIUM CHLORIDE 0.9 % IJ SOLN: 3.0000 mL | INTRAMUSCULAR | Status: DC | PRN

## 2012-06-06 NOTE — ED Notes (Signed)
Attempted blood draw from [facility], unsuccessful.

## 2012-06-06 NOTE — ED Notes (Signed)
[facility] notified to transport pt to [facility]

## 2012-06-06 NOTE — ED Notes (Signed)
MD at bedside. 

## 2012-06-06 NOTE — H&P (Signed)
Family Medicine Teaching [facility] Admission History and Physical Service Pager: [phone]  Patient name: Ethan Williams Medical record number: [id number] Date of birth: 23-Mar-1961 Age: 51 y.o. Gender: male  Primary Care Provider: Benita Stabile, MD  Chief Complaint: chest pain, palpitations, SOB.  Assessment and Plan: Ethan Williams is a 51 y.o. year old male with a history of tobacco abuse presenting with frequent episodes of palpitations and chest pain with associated SOB.  1. Chest pain and palpitations - Rule out ACS.  Patient was seen in the [facility] ED.  EKG - NSR and Negative Troponin x 1. BMP and CBC unremarkable.  ACS unlikely at this point given negative workup and few risk factors. - Patient admitted to Telemetry, FMTS, Attending Dr. Sheffield Slider  - Will cycle Troponin x 2. - Nitroglycerin PRN for chest pain - Holding ASA given patient consumed several doses of ASA 325 mg today - Risk stratification labs ordered - TSH, Lipid Panel. - Will start statin therapy based upon lipid panel results. - UDS also ordered given history of illicit substance use.  2. SOB - Chest pain and palpitations associated with SOB. - Patient currently has no oxygen requirement. - Will continue to monitor closely  3. Tobacco abuse - Smoking cessation counseling during admission - Patient declined Nicotine patch  4. Bright red blood per rectum - likely secondary to ASA use - Patient reports frequent use of ASA and recent bloody stool (primarily with wiping) - Will check Hemocult stool - Will monitor closely  FEN/GI: Heart healthy diet,  Prophylaxis: Heparin 5000 U TID Disposition: Pending negative workup and overall clinical improvement Code Status: Full code  History of Present Illness:  Ethan Williams is a 51 y.o. year old male presenting with palpitations, chest pain, and SOB. He states that he has been having frequent episodes (6-12 daily) of chest pain and palpitations with  associated SOB for the past two weeks.  Chest pain is located in the left upper ribs and described as pressure sensation.  Pain is moderate to severe when it occurs, and is not associated with radiation.  Chest pain occurs primarily with exertion but also occurs at rest.  No exacerbating or relieving factors.  Episodes typically last for 1-2 minutes and then symptoms resolve.  Patient currently pain free.  Patient has no family history of CAD.  Risk factors for CAD: age, current tobacco abuse (patient is an 1ppd smoker) Of note, patient reports that he saw a Cardiologist several years ago and had a negative stress test and normal echocardiogram.  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   Patient Active Problem List  Diagnosis  . Right shoulder pain  . Glenohumeral arthritis  . Headache  . Healthcare maintenance  . CAP (community acquired pneumonia)  . Dental caries  . Elevated BP  . Chest pain  . Tobacco dependence  . Somatic dysfunction of upper extremities  . Somatic dysfunction of thoracic region  . Somatic dysfunction of Ribs   Past Medical History: Past Medical History  Diagnosis Date  . Pneumonia     Past Surgical History: Past Surgical History  Procedure Date  . Appendectomy 2001   Social History: History  Substance Use Topics  . Smoking status: Current Every Day Smoker -- 1.0 packs/day for 37 years    Types: Cigarettes  . Smokeless tobacco: Not on file  . Alcohol Use: No     History of abuse - clean 2+ years  Hx of illicit drug use - cocaine.  Has been sober for the past 3 years.    For any additional social history documentation, please refer to relevant sections of EMR.  Family History: Family History  Problem Relation Age of Onset  . Cancer Father   No family history of CAD/MI  Allergies: No Known Allergies No current facility-administered medications on file prior to encounter.   Current Outpatient  Prescriptions on File Prior to Encounter  Medication Sig Dispense Refill  . acetaminophen (TYLENOL) 500 MG tablet Take 500 mg by mouth every 4 (four) hours as needed. For pain.        Review Of Systems: Per HPI with the following additions: Patient reports daily headache, for which he takes tylenol or aspirin (4-6 tablets a day).  Patient also reports recent episode of bloody stool (primarily with wiping), and current headache.  Physical Exam: BP 121/80  Pulse 74  Temp 98.7 F (37.1 C) (Oral)  Resp 17  Ht 6' (1.829 m)  Wt 191 lb 14.4 oz (87.045 kg)  BMI 26.03 kg/m2  SpO2 98%  Exam: General: alert, well-appearing gentlemen, NAD. HEENT: NCAT. Sclera anicteric. Cardiovascular: RRR. No murmurs, rubs, or gallops. Respiratory: CTAB. No rales, rhonchi, or wheeze. Abdomen: soft, nontender, nondistended. +BS. Extremities: warm, well perfused. 2+ dorsalis pedis pulses. Skin: warm, dry, intact. Neuro: AO X 3. No focal deficits.  Labs and Imaging: CBC BMET   Lab 06/06/12 1512  WBC 11.9*  HGB 15.9  HCT 45.3  PLT 267    Lab 06/06/12 1512  NA 135  K 4.1  CL 100  CO2 24  BUN 14  CREATININE 0.90  GLUCOSE 90  CALCIUM 9.3     D-Dimer - <0.27.  EKG - NSR. No ST or T wave changes.  Dg Chest 2 View 06/06/2012 IMPRESSION: No acute cardiopulmonary abnormality seen.    Everlene Other, DO 06/06/2012, 7:14 PM   I interviewed and examined the patient with Dr. Adriana Simas. I have read his note and made appropriate amendments. I agree with his assessment and plan.   Si Raider Clinton Sawyer, MD, PGY-2

## 2012-06-06 NOTE — ED Notes (Addendum)
Has had palpitations that started this morning, started as soon as got out of bed, made feel dizzy, short of breath, started after taking Viagra two weeks ago

## 2012-06-06 NOTE — ED Provider Notes (Signed)
History     CSN: [id number]  Arrival date & time 06/06/12  1314   First MD Initiated Contact with Patient 06/06/12 1405      Chief Complaint  Patient presents with  . Chest Pain  . Palpitations    (Consider location/radiation/quality/duration/timing/severity/associated sxs/prior treatment) Patient is a 51 y.o. male presenting with chest pain and palpitations. The history is provided by the patient.  Chest Pain Primary symptoms include palpitations.    Palpitations  Associated symptoms include chest pain.  For the last 2 weeks, he has been having episodes of palpitations with associated dyspnea and a heavy feeling in his chest. Heavy feeling as moderate to severe and he rates it at 6/10 when present. Episodes last about 1-2 minutes before resolving. They can come on at rest as well as with mild exertion. Nothing makes it better and nothing makes them worse. Symptoms of been coming on more frequently and with greater intensity and the worst episode he had was today just prior to coming to the emergency department. There is no radiation of his chest discomfort. There is no associated nausea, vomiting, diaphoresis. He took 6 aspirin tablets of 325 mg each prior to coming to the emergency department. He is continuing to have episodes and states that he had an episode while at in the bed awaiting physician to come and see him. Cardiac risk factors are positive for tobacco abuse-he smokes one pack of cigarettes a day. He denies history of hypertension, diabetes, hyperlipidemia and there is no family history of coronary artery disease.  Past Medical History  Diagnosis Date  . Pneumonia     Past Surgical History  Procedure Date  . Appendectomy 2001    Family History  Problem Relation Age of Onset  . Cancer Father     History  Substance Use Topics  . Smoking status: Current Every Day Smoker -- 1.0 packs/day for 37 years    Types: Cigarettes  . Smokeless tobacco: Not on file  .  Alcohol Use: No     History of abuse - clean 2+ years      Review of Systems  Cardiovascular: Positive for chest pain and palpitations.  All other systems reviewed and are negative.    Allergies  Review of patient's allergies indicates no known allergies.  Home Medications   Current Outpatient Rx  Name Route Sig Dispense Refill  . ACETAMINOPHEN 500 MG PO TABS Oral Take 500 mg by mouth every 4 (four) hours as needed. For pain.    . ASPIRIN 325 MG PO TABS Oral Take 1,950 mg by mouth once.    . ADULT MULTIVITAMIN W/MINERALS CH Oral Take 1 tablet by mouth daily.      BP 136/88  Pulse 86  Temp 98.8 F (37.1 C) (Oral)  Resp 14  SpO2 98%  Physical Exam  Nursing note and vitals reviewed. 51year old male, resting comfortably and in no acute distress. Vital signs are  normal. Oxygen saturation is 98%, which is normal. Head is normocephalic and atraumatic. PERRLA, EOMI. Oropharynx is clear. Neck when compared with ECG of 12/14/2011, no significant changes are seen. is nontender and supple without adenopathy or JVD. Back is nontender and there is no CVA tenderness. Lungs are clear without rales, wheezes, or rhonchi. Chest is nontender. Heart has regular rate and rhythm without murmur. Abdomen is soft, flat, nontender without masses or hepatosplenomegaly and peristalsis is normoactive. Extremities have no cyanosis or edema, full range of motion is present. Skin is warm  and dry without rash. Neurologic: Mental status is normal, cranial nerves are intact, there are no motor or sensory deficits.    ED Course  Procedures (including critical care time)  Results for orders placed during the hospital encounter of 06/06/12  CBC WITH DIFFERENTIAL      Component Value Range   WBC 11.9 (*) 4.0 - 10.5 K/uL   RBC 5.42  4.22 - 5.81 MIL/uL   Hemoglobin 15.9  13.0 - 17.0 g/dL   HCT 16.1  09.6 - 04.5 %   MCV 83.6  78.0 - 100.0 fL   MCH 29.3  26.0 - 34.0 pg   MCHC 35.1  30.0 - 36.0 g/dL    RDW 40.9  81.1 - 91.4 %   Platelets 267  150 - 400 K/uL   Neutrophils Relative 55  43 - 77 %   Neutro Abs 6.5  1.7 - 7.7 K/uL   Lymphocytes Relative 31  12 - 46 %   Lymphs Abs 3.7  0.7 - 4.0 K/uL   Monocytes Relative 11  3 - 12 %   Monocytes Absolute 1.3 (*) 0.1 - 1.0 K/uL   Eosinophils Relative 2  0 - 5 %   Eosinophils Absolute 0.2  0.0 - 0.7 K/uL   Basophils Relative 1  0 - 1 %   Basophils Absolute 0.1  0.0 - 0.1 K/uL  BASIC METABOLIC PANEL      Component Value Range   Sodium 135  135 - 145 mEq/L   Potassium 4.1  3.5 - 5.1 mEq/L   Chloride 100  96 - 112 mEq/L   CO2 24  19 - 32 mEq/L   Glucose, Bld 90  70 - 99 mg/dL   BUN 14  6 - 23 mg/dL   Creatinine, Ser 7.82  0.50 - 1.35 mg/dL   Calcium 9.3  8.4 - 95.6 mg/dL   GFR calc non Af Amer >90  >90 mL/min   GFR calc Af Amer >90  >90 mL/min  TROPONIN I      Component Value Range   Troponin I <0.30  <0.30 ng/mL   Dg Chest 2 View  06/06/2012  *RADIOLOGY REPORT*  Clinical Data: Chest pain.  CHEST - 2 VIEW  Comparison: December 14, 2011.  Findings: Cardiomediastinal silhouette appears normal.  Acute pulmonary disease is noted.  Bony thorax is intact.  IMPRESSION: No acute cardiopulmonary abnormality seen.   Original Report Authenticated By: Venita Sheffield., M.D.      ECG shows normal sinus rhythm with a rate of 81, no ectopy. Normal axis. Normal P wave. Normal QRS. Normal intervals. Normal ST and T waves. Ipression: normal ECG. When compared with ECG of 12/14/2011, no significant changes are seen.   1. Chest pain   2. Palpitations   3. Tobacco dependence       MDM  Chest pain, dyspnea, palpitations worrisome for coronary artery disease. He is already taken aspirin at home, so no aspirin is given here. Workup has been initiated. He does have significant risk factor of tobacco abuse.  He has not had any episodes of chest pain and palpitations since my evaluation. Workup is unremarkable. However, he has been having frequent episodes  and as such I do not feel he would be a good candidate for evaluation in CDU. Case is discussed with Dr.De Lawson [facility]  Family practice residency service who agrees to accept the patient in transfer to the [facility]. In addition to a rule out MI  workup, he will need some kind of stress testing and/or cardiac CT scanning. You also need a Holter monitor to evaluate his possible arrhythmia.      Dione Booze, MD 06/06/12 [phone]

## 2012-06-07 LAB — LIPID PANEL
Cholesterol: 182 mg/dL (ref 0–200)
Total CHOL/HDL Ratio: 4.9 RATIO
Triglycerides: 124 mg/dL (ref <150)

## 2012-06-07 LAB — TSH: TSH: 1.084 u[IU]/mL (ref 0.350–4.500)

## 2012-06-07 LAB — BASIC METABOLIC PANEL
CO2: 25 mEq/L (ref 19–32)
Chloride: 101 mEq/L (ref 96–112)
Creatinine, Ser: 0.93 mg/dL (ref 0.50–1.35)
GFR calc Af Amer: >90 mL/min (ref >90)
Potassium: 3.7 mEq/L (ref 3.5–5.1)
Sodium: 137 mEq/L (ref 135–145)

## 2012-06-07 NOTE — Progress Notes (Signed)
Pt to DC home with family.  Instructions given and reviewed, questions answered.  Follow up appointment in place.  Pt awaits ride.

## 2012-06-07 NOTE — Progress Notes (Signed)
FMTS Attending Daily Note:  Renold Don MD  [phone] pager  Family Practice pager:  [phone]  I have reviewed this patient and the chart. I have discussed this patient with the resident and admitting attending Dr. Sheffield Slider.  I agree with their findings, assessment and care plan.

## 2012-06-07 NOTE — Progress Notes (Signed)
Pt ambulating independently in hall.  After walk states "my breathing feels fine."  Will con't plan of care.

## 2012-06-07 NOTE — Discharge Summary (Signed)
Family Medicine Teaching [facility] Discharge Summary  Patient name: Ethan Williams Medical record number: [id number] Date of birth: 06-Jun-1961 Age: 51 y.o. Gender: male Date of Admission: 06/06/2012  Date of Discharge: 06/07/12 Admitting Physician: Tobey Grim, MD  Primary Care Provider: Benita Stabile, MD  Indication for Hospitalization: Chest pain, palpitations, SOB Discharge Diagnoses: Chest pain, non-cardiac Palpitations SOB Tobacco abuse Bright red blood per rectum  Brief Hospital Course: 51 y.o. year old male with a history of tobacco abuse presented to the ED with frequent episodes of palpitations and chest pain with associated SOB.   1) Chest pain, non-cardiac with associated Palpitations and SOB Patient presented with complaint of several episodes chest pain, palpitations, and associated SOB for the past 2 weeks.  Chest pain was located in the upper thorax below the nipple/pectoralis muscle, intermittent in nature and occuring both at rest and with exertion.  It was not improved with rest or nitroglycerin.  It was associated with palpitations and SOB.  Patient had one episode of chest pain in the ED and received Nitro.  Patient had already taken aspirin (6 ASA 325 mg) prior to coming to the hospital.   Given age and tobacco abuse there was concern for ischemia prompting work up.  Work up included the following: 1) EKG -  Normal sinus rhythm with no ST, T wave changes 2) Troponin x 3 - Negative 3) Chest Xray - No acute findings 4) D-Dimer - <0.27.  UDS was also obtained given history of illicit substance use; it was negative.  Risk stratification labs, TSH and lipid panel were also obtained and were unremarkable.  History and work up were consistent with non-cardiac chest pain and patient was discharged home.    2) Tobacco abuse  Patient received tobacco cessation counseling during admission.  Patient will need additional counseling as an outpatient.  3) Bright red  blood per rectum On ROS, patient reported recent episode of BRBPR.  Patient also reported frequent ASA use due to frequent headaches.   Patient refused rectal exam during admission and did not have BM for hemoccult.  Hemoglobin was stable during admission. BRBPR likely secondary to frequent ASA use.   Patient may need further outpatient work up if it continues.   Significant Labs and Imaging:   CBC BMET   Lab 06/06/12 2140 06/06/12 1512  WBC 11.5* 11.9*  HGB 15.7 15.9  HCT 44.2 45.3  PLT 242 267    Lab 06/07/12 0301 06/06/12 2140 06/06/12 1512  NA 137 -- 135  K 3.7 -- 4.1  CL 101 -- 100  CO2 25 -- 24  BUN 12 -- 14  CREATININE 0.93 0.94 0.90  GLUCOSE 94 -- 90  CALCIUM 9.1 -- 9.3     Cardiac Panel (last 3 results)  Basename 06/07/12 0301 06/06/12 2140 06/06/12 1512  CKTOTAL -- -- --  CKMB -- -- --  TROPONINI <0.30 <0.30 <0.30  RELINDX -- -- --   D-Dimer - <0.27.  Lab Results  Component Value Date   TSH 1.084 06/06/2012   Drugs of Abuse     Component Value Date/Time   LABOPIA NONE DETECTED 06/06/2012 2159   COCAINSCRNUR NONE DETECTED 06/06/2012 2159   LABBENZ NONE DETECTED 06/06/2012 2159   AMPHETMU NONE DETECTED 06/06/2012 2159   THCU NONE DETECTED 06/06/2012 2159   LABBARB NONE DETECTED 06/06/2012 2159    Lipid Panel     Component Value Date/Time   CHOL 182 06/07/2012 0301   TRIG 124 06/07/2012 0301  HDL 37* 06/07/2012 0301   CHOLHDL 4.9 06/07/2012 0301   VLDL 25 06/07/2012 0301   LDLCALC 120* 06/07/2012 0301   EKG - NSR.  Dg Chest 2 View 06/06/2012  IMPRESSION: No acute cardiopulmonary abnormality seen.    Procedures: None  Consultations: None  Discharge Medications:    Medication List     As of 06/07/2012  7:52 PM    STOP taking these medications         aspirin 325 MG tablet      TAKE these medications         acetaminophen 500 MG tablet   Commonly known as: TYLENOL   Take 500 mg by mouth every 4 (four) hours as needed. For pain.       multivitamin with minerals Tabs   Take 1 tablet by mouth daily.       Issues for Follow Up:  1) Patient uses Aspirin frequently.  Recommend starting PPI. 2) Resolution of symptoms.  Consider further workup if patient continues to report chest pain, palpitations, and SOB. 3) Patient needs continued smoking cessation counseling.  4) Resolution of rectal bleeding/bloody stool.  Likely secondary to overuse of Aspirin. Patient also needs screening colonoscopy.   5) Assessment of Headache.  Outstanding Results: None  Discharge Instructions: Patient was counseled important signs and symptoms that should prompt return to medical care, changes in medications, dietary instructions, activity restrictions, and follow up appointments.  Significant instructions noted below:     Follow-up Information    Follow up with RIGBY, MICHAEL, DO. On 06/18/2012. (10:00 am)    Contact information:   [address] [place] [address] [phone]          Discharge Condition: Stable. Discharged home.  Everlene Other, DO 06/07/2012, 7:52 PM

## 2012-06-07 NOTE — Progress Notes (Signed)
Family Medicine Teaching Service Daily Progress Note Service Page: [phone]  Patient Assessment: 51 year old male with PMH of tobacco abuse presents with chest pain, palpitations, and associated SOB x 2 weeks.  Subjective:  Feeling well this am. Reports 2 episodes of chest pain last night.  Located in left upper thorax (mid - clavicular). Denies pain this am, as well as nausea/vomiting, SOB.  Objective: Temp:  [97.9 F (36.6 C)-98.8 F (37.1 C)] 98.2 F (36.8 C) (10/04 0536) Pulse Rate:  [59-86] 62  (10/04 0536) Resp:  [14-20] 18  (10/04 0536) BP: (104-139)/(69-94) 104/69 mmHg (10/04 0536) SpO2:  [96 %-100 %] 98 % (10/04 0536) Weight:  [191 lb 14.4 oz (87.045 kg)] 191 lb 14.4 oz (87.045 kg) (10/03 1827)  Exam: General: awake, alert. NAD. Cardiovascular: RRR. No murmurs, rubs, or gallops. Respiratory: CTAB. No rales, rhonchi, or wheeze. Abdomen: soft, nontender, nondistended.  Extremities: warm, well perfused. No edema noted. Rectal: deferred by patient.  CBC BMET   Lab 06/06/12 2140 06/06/12 1512  WBC 11.5* 11.9*  HGB 15.7 15.9  HCT 44.2 45.3  PLT 242 267    Lab 06/07/12 0301 06/06/12 2140 06/06/12 1512  NA 137 -- 135  K 3.7 -- 4.1  CL 101 -- 100  CO2 25 -- 24  BUN 12 -- 14  CREATININE 0.93 0.94 0.90  GLUCOSE 94 -- 90  CALCIUM 9.1 -- 9.3     Lipid Panel     Component Value Date/Time   CHOL 182 06/07/2012 0301   TRIG 124 06/07/2012 0301   HDL 37* 06/07/2012 0301   CHOLHDL 4.9 06/07/2012 0301   VLDL 25 06/07/2012 0301   LDLCALC 120* 06/07/2012 0301   Cardiac Panel (last 3 results)  Basename 06/07/12 0301 06/06/12 2140 06/06/12 1512  CKTOTAL -- -- --  CKMB -- -- --  TROPONINI <0.30 <0.30 <0.30  RELINDX -- -- --   Lab Results  Component Value Date   TSH 1.084 06/06/2012   Drugs of Abuse     Component Value Date/Time   LABOPIA NONE DETECTED 06/06/2012 2159   COCAINSCRNUR NONE DETECTED 06/06/2012 2159   LABBENZ NONE DETECTED 06/06/2012 2159   AMPHETMU NONE  DETECTED 06/06/2012 2159   THCU NONE DETECTED 06/06/2012 2159   LABBARB NONE DETECTED 06/06/2012 2159    D-dimer - <0.27  EKG this am - NSR.  Imaging/Diagnostic Tests:  Dg Chest 2 View 06/06/2012  IMPRESSION: No acute cardiopulmonary abnormality seen.  Assessment/Plan: Hairo Garraway is a 51 y.o. year old male with a history of tobacco abuse presenting with frequent episodes of palpitations and chest pain with associated SOB.   1. Chest pain and palpitations - likely non-cardiac chest pain. - Troponin negative x 3, EKG revealed NSR, UDS negative. - Lipid panel & TSH obtained; TSH normal, Lipid panel remarkable for LDL 120 & HDL 37. - Given negative work up, symptoms likely not of cardiac origin. - Likely D/C home today with close outpatient follow up.  May need outpatient workup if has continued symptoms.  2. SOB  - Chest pain and palpitations associated with SOB.  - Patient currently has no oxygen requirement.  - Will continue to monitor closely   3. Tobacco abuse  - Smoking cessation counseling - Patient declined Nicotine patch   4. Bright red blood per rectum - likely secondary to ASA use  - Patient reports frequent use of ASA and recent bloody stool (primarily with wiping)  - Will check Hemocult stool  - Patient refused  rectal exam this am.  FEN/GI: Heart healthy diet,  Prophylaxis: Heparin 5000 U TID  Disposition: D/C home today given negative workup Code Status: Full code   Everlene Other, DO 06/07/2012, 8:21 AM

## 2012-06-07 NOTE — H&P (Signed)
I interviewed and examined this patient and discussed the care plan with Dr. Clinton Sawyer and the [facility] team and agree with assessment and plan as documented in the admission note for yesterday. He is currently without pain or dyspnea, leg exam is normal, second troponin is normal. He has no chondral tenderness to suggest a slipping rib. This could be splenic flexure syndrome, but he denies excessive colon gas or change in bowel habits.     Breonna Gafford A. Sheffield Slider, MD Family Medicine Teaching Service Attending  06/07/2012 8:05 AM

## 2012-06-09 NOTE — Discharge Summary (Signed)
Family Medicine Teaching Service  Discharge Note : Attending Jeff Kamryn Messineo MD Pager [phone] Inpatient Team Pager:  [phone]  I have seen and examined this patient, reviewed their chart and discussed discharge planning with the resident at the time of discharge. I agree with the discharge plan as above.  

## 2012-06-18 ENCOUNTER — Inpatient Hospital Stay: Payer: Self-pay | Admitting: Sports Medicine

## 2012-12-06 ENCOUNTER — Ambulatory Visit (INDEPENDENT_AMBULATORY_CARE_PROVIDER_SITE_OTHER): Payer: No Typology Code available for payment source | Admitting: Family Medicine

## 2012-12-06 ENCOUNTER — Encounter: Payer: Self-pay | Admitting: Family Medicine

## 2012-12-06 VITALS — BP 146/55 | HR 85 | Ht 72.0 in | Wt 191.0 lb

## 2012-12-06 DIAGNOSIS — M9907 Segmental and somatic dysfunction of upper extremity: Secondary | ICD-10-CM

## 2012-12-06 DIAGNOSIS — M25511 Pain in right shoulder: Secondary | ICD-10-CM

## 2012-12-06 DIAGNOSIS — M19019 Primary osteoarthritis, unspecified shoulder: Secondary | ICD-10-CM

## 2012-12-06 DIAGNOSIS — M25519 Pain in unspecified shoulder: Secondary | ICD-10-CM

## 2012-12-06 DIAGNOSIS — M19011 Primary osteoarthritis, right shoulder: Secondary | ICD-10-CM

## 2012-12-06 DIAGNOSIS — M999 Biomechanical lesion, unspecified: Secondary | ICD-10-CM

## 2012-12-06 NOTE — Patient Instructions (Addendum)
You have been scheduled for a MR Arthrogram of your right shoulder on 12/11/12 please arrive at 9:45 am. [facility], entrance A- go to 1st floor radiology

## 2012-12-06 NOTE — Progress Notes (Signed)
  Subjective:    Patient ID: Ethan Williams, male    DOB: [birth year], 52 y.o.   MRN: [id number]  HPI Right shoulder pain. No specific injury. I had seen him a couple of years ago for similar complaints and he did physical therapy which was helpful. He also had a corticosteroid injection which was helpful for about a month. He's continues to do his exercises intermittently but does note that his symptoms have worsened over the last 2 months. Pain mostly with overhead and crossed arm activities. He keeps her from doing a lot of activities. 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 He is also pursuing disability and wonders if this would qualify him for increased disability.   Review of Systems Denies numbness or 2 and is hand. Has had no skin color change in his hand or arm on the right. Denies fever.    Objective:   Physical Exam  Vital signs are reviewed GENERAL: Well-developed male no acute distress Shoulder: Right. Full range of motion although he has pain with full overhead extension. Rotator cuff strength is intact in all planes. Positive O'Brien's test. Positive pain with liftoff test. Axial load testing causes pain. His shoulders located. Scapular muscles are intact and symmetrical.      Assessment & Plan:  #1. Shoulder pain. We reviewed his x-rays which show some degenerative changes. He may also have labral issues so we will set him up for MR arthrogram. I did tell him that it's very unlikely he would qualify for any disability secondary to shoulder issues. At the most I would expect with less than 1% disability determination as he actually has full use of the shoulder even though it is painful.

## 2012-12-11 ENCOUNTER — Ambulatory Visit (HOSPITAL_COMMUNITY)
Admission: RE | Admit: 2012-12-11 | Discharge: 2012-12-11 | Disposition: A | Discharge status: Home / Self Care | Payer: No Typology Code available for payment source | Source: Ambulatory Visit | Attending: Family Medicine | Admitting: Family Medicine

## 2012-12-11 DIAGNOSIS — M24419 Recurrent dislocation, unspecified shoulder: Secondary | ICD-10-CM | POA: Insufficient documentation

## 2012-12-11 DIAGNOSIS — M25511 Pain in right shoulder: Secondary | ICD-10-CM

## 2012-12-11 DIAGNOSIS — M19019 Primary osteoarthritis, unspecified shoulder: Secondary | ICD-10-CM | POA: Insufficient documentation

## 2012-12-11 DIAGNOSIS — M25519 Pain in unspecified shoulder: Secondary | ICD-10-CM | POA: Insufficient documentation

## 2012-12-11 MED ORDER — GADOBENATE DIMEGLUMINE 529 MG/ML IV SOLN
5.0000 mL | Freq: Once | INTRAVENOUS | Status: AC | PRN
  Administered 2012-12-11: 0.1 mL via INTRAVENOUS

## 2012-12-11 MED ORDER — IOHEXOL 180 MG/ML  SOLN
20.0000 mL | Freq: Once | INTRAMUSCULAR | Status: AC | PRN
  Administered 2012-12-11: 14 mL via INTRA_ARTICULAR

## 2012-12-11 NOTE — Procedures (Signed)
Right shoulder injection for MRI performed without complication. See radiology report for full description.

## 2012-12-12 ENCOUNTER — Ambulatory Visit (HOSPITAL_COMMUNITY): Payer: No Typology Code available for payment source | Admitting: Psychiatry

## 2012-12-16 ENCOUNTER — Telehealth: Payer: Self-pay | Admitting: Family Medicine

## 2012-12-16 NOTE — Telephone Encounter (Signed)
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 If he wants me to try and send him to [facility] or [facility] I will sennd paper work in. There will be an up front fee usually with either (he is uninsured). THANKS Denny Levy

## 2012-12-18 NOTE — Telephone Encounter (Signed)
Left pt. VM to return my call.

## 2012-12-18 NOTE — Telephone Encounter (Signed)
Left VM for pt to return my call  

## 2012-12-20 NOTE — Telephone Encounter (Signed)
Spoke with pt- he requests referral to [facility].

## 2012-12-23 NOTE — Telephone Encounter (Signed)
Amy Can u set him up with [facility]? Denny Levy

## 2012-12-23 NOTE — Telephone Encounter (Signed)
Scheduled pt for appt at [facility] in [place] with Dr. Clement Husbands 01/08/13 @ 8:30 am.  Pt will need to pay $150 upfront and speak with financial counselor before appt.  Left pt to return my call regarding appt.  Faxed records to [phone] Attn Debra B.

## 2012-12-23 NOTE — Telephone Encounter (Signed)
Spoke with pt- gave him appt info, and asked him to call financial counselor at [employer].

## 2013-01-06 ENCOUNTER — Ambulatory Visit (INDEPENDENT_AMBULATORY_CARE_PROVIDER_SITE_OTHER): Payer: No Typology Code available for payment source | Admitting: Sports Medicine

## 2013-01-06 ENCOUNTER — Encounter: Payer: Self-pay | Admitting: Sports Medicine

## 2013-01-06 VITALS — BP 107/91 | HR 90 | Ht 72.0 in | Wt 179.0 lb

## 2013-01-06 DIAGNOSIS — K649 Unspecified hemorrhoids: Secondary | ICD-10-CM | POA: Insufficient documentation

## 2013-01-06 DIAGNOSIS — N529 Male erectile dysfunction, unspecified: Secondary | ICD-10-CM

## 2013-01-06 DIAGNOSIS — Z0000 Encounter for general adult medical examination without abnormal findings: Secondary | ICD-10-CM

## 2013-01-06 DIAGNOSIS — M19019 Primary osteoarthritis, unspecified shoulder: Secondary | ICD-10-CM

## 2013-01-06 DIAGNOSIS — M19011 Primary osteoarthritis, right shoulder: Secondary | ICD-10-CM

## 2013-01-06 MED ORDER — TADALAFIL 10 MG PO TABS: 10.0000 mg | ORAL_TABLET | ORAL | Status: DC | PRN

## 2013-01-06 MED ORDER — HYDROCORTISONE ACETATE 25 MG RE SUPP: 25.0000 mg | Freq: Two times a day (BID) | RECTAL | Status: DC | PRN

## 2013-01-06 MED ORDER — POLYETHYLENE GLYCOL 3350 17 GM/SCOOP PO POWD: 17.0000 g | Freq: Two times a day (BID) | ORAL | Status: DC | PRN

## 2013-01-06 NOTE — Patient Instructions (Addendum)
It was nice to see you today.   Today we discussed: 1. Erectile dysfunction Trial - tadalafil (CIALIS) 10 MG tablet; Take 1 tablet (10 mg total) by mouth every other day as needed for erectile dysfunction.  Dispense: 5 tablet; Refill: 1  2. Hemorrhoids Try taking: - polyethylene glycol powder (GLYCOLAX/MIRALAX) powder; Take 17 g by mouth 2 (two) times daily as needed (take once daily.  Take second dose if not having 1 BM per day).  Dispense: 3350 g; Refill: 5  Fill this if you need to: - hydrocortisone (ANUSOL-HC) 25 MG suppository; Place 1 suppository (25 mg total) rectally 2 (two) times daily as needed for hemorrhoids (pain, bleeding).  Dispense: 12 suppository; Refill: 0   I recommend that you have a screening colonoscopy.  I do not believe there any availability for the orange card.  We can try to refer you [facility] if interested.  They are able to set up a payment plan for you.  Please let us know if you're interested in this  Please plan to return to see me in 6 months.  If you need anything prior to seeing me please call the clinic.  Please Bring all medications with you to each appointment.

## 2013-01-06 NOTE — Assessment & Plan Note (Signed)
Appointment next week with [facility]

## 2013-01-06 NOTE — Assessment & Plan Note (Signed)
rx for Miralax daily rx for annusol Instructions for RTC

## 2013-01-06 NOTE — Assessment & Plan Note (Signed)
Patient has orange card.  There are no available referrals for colonoscopy.  Have offered screening colonoscopy through [facility] the patient declines at this time.

## 2013-01-06 NOTE — Assessment & Plan Note (Signed)
Pt requesting.  Issues with maintaining. Rx for Cialis to trial

## 2013-01-06 NOTE — Progress Notes (Signed)
  [facility]  Patient name: Roverto Bodmer MRN [id number]  Date of birth: 03-Apr-1961  CC & HPI:  Kaylen Nghiem is a 52 y.o. male presenting today for evaluation of:  Hemorrhoids:  Reports has been having issues for over one year.  Was having bleeding and pain last month that has resulted this time.  Does occasionally note a bulge.  Has not been taking daily fiber and poor dietary intake.  Reports right now no pain or bleeding in the last 3 weeks.  ED:  Reports having issues with erectile maintenance.  Heard about 30 day trial and interested in trying.  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  Has appointment with [facility]  Medications: Reviewed & Updated - see associated section Social History: Reviewed -  reports that he has been smoking Cigarettes.  He has a 40 pack-year smoking history. He has never used smokeless tobacco. denies alcohol use "hasn't touch that stuff in years"  Objective Findings:  Vitals: BP 107/91  Pulse 90  Ht 6' (1.829 m)  Wt 179 lb (81.194 kg)  BMI 24.27 kg/m2  PE: GENERAL:  Adult caucasian  male. In no discomfort; no respiratory distress. PSYCH: Alert and appropriately interactive; Insight:Fair   H&N: AT/[place], trachea midline EENT:  MMM, no scleral icterus, EOMi Abdomen: +BS, soft, non-tender, no rigidity, no guarding, no masses/organomegaly, no caput medusae   Assessment & Plan:

## 2013-03-13 ENCOUNTER — Ambulatory Visit (INDEPENDENT_AMBULATORY_CARE_PROVIDER_SITE_OTHER): Payer: No Typology Code available for payment source | Admitting: Sports Medicine

## 2013-03-13 VITALS — BP 117/81 | HR 72 | Temp 98.9°F | Ht 72.0 in | Wt 172.3 lb

## 2013-03-13 DIAGNOSIS — H539 Unspecified visual disturbance: Secondary | ICD-10-CM

## 2013-03-13 DIAGNOSIS — K029 Dental caries, unspecified: Secondary | ICD-10-CM

## 2013-03-13 DIAGNOSIS — H538 Other visual disturbances: Secondary | ICD-10-CM

## 2013-03-16 DIAGNOSIS — H539 Unspecified visual disturbance: Secondary | ICD-10-CM | POA: Insufficient documentation

## 2013-03-16 NOTE — Assessment & Plan Note (Signed)
Refer to Dentistry with [facility], pt aware of likely wait

## 2013-03-16 NOTE — Progress Notes (Signed)
  [facility]  Patient name: Ethan Williams MRN [id number]  Date of birth: [birth year]  CC & HPI:  Eleuterio Dollar is a 52 y.o. 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   Severity:  mild/moderate  Aggravating:  night time driving  Associated Symptoms:    Effective Therapies:    Inneffective Therapies:    RED FLAGS:  No double vision   # Dental Pain  Character:  gnawing left lower molar pain  Onset/Duration:  gradual onset but worse over past 1 month   Severity:  Moderate  Aggravating:  Eating  Associated Symptoms:  hot and cold sensitivity  Effective Therapies: None  Inneffective Therapies: None  ROS/RED FLAGS:  No fevers, no chills, no decreased PO      ROS:  Per HPI  Pertinent History Reviewed:  Medical & Surgical Hx:  Reviewed: Significant for recent R shoulder arthroplasty Medications: Reviewed & Updated - see associated section Social History: Reviewed -  reports that he has been smoking Cigarettes.  He has a 40 pack-year smoking history. He has never used smokeless tobacco.  Objective Findings:  Vitals: BP 117/81  Pulse 72  Temp(Src) 98.9 F (37.2 C) (Oral)  Ht 6' (1.829 m)  Wt 172 lb 4.8 oz (78.155 kg)  BMI 23.36 kg/m2  PE: GENERAL: Adult caucasian  male. In no discomfort; no respiratory distress  PSYCH:  alert and appropriate,  HNEENT:  H&N: AT/[place], trachea midline  Eyes: no scleral icterus, no conjunctival exudate Vision tested with snellen - R eye = 50/20, L eye = 20/30  Ears:   Nose: No nasal discharge  Oropharynx: MMM  Dentention: Overall poor multiple caries, no abscess, Right upper incisor cracked, no pulp noted    CARDIO:    LUNGS:    ABDOMEN:    EXTREM: R arm in sling, steri strips in place, no erythema   GU:   SKIN:   NEUROMSK:      Assessment & Plan:

## 2013-03-18 ENCOUNTER — Ambulatory Visit (INDEPENDENT_AMBULATORY_CARE_PROVIDER_SITE_OTHER): Payer: No Typology Code available for payment source | Admitting: Family Medicine

## 2013-03-18 VITALS — BP 123/76 | HR 72 | Temp 98.3°F | Ht 72.0 in | Wt 172.9 lb

## 2013-03-18 DIAGNOSIS — R21 Rash and other nonspecific skin eruption: Secondary | ICD-10-CM

## 2013-03-18 DIAGNOSIS — B360 Pityriasis versicolor: Secondary | ICD-10-CM | POA: Insufficient documentation

## 2013-03-18 NOTE — Assessment & Plan Note (Signed)
Skin scraping revealed spores and hyphae ("spaghetti and meatballs") consistent with tinea versicolor. Will treat with topical selenium sulfide ([place] blue).

## 2013-03-18 NOTE — Progress Notes (Signed)
  Subjective:    Patient ID: Ethan Williams, male    DOB: 09-Oct-1960, 52 y.o.   MRN: [id number]  HPI 52 year old male presents for evaluation of rash.  1) Rash - Located on Back - Began ~ 2 months ago after exposure to "poisonous plant". - Had some itching initially.  No itching currently. - No associated fevers, chills.  No hx of immunocompromise. - No recent exposures (other than above); no recent changes in medications.  Review of Systems Per HPI    Objective:   Physical Exam General: well appearing gentleman in NAD. Skin: Large area of erythema (slighly raised) with associated dry skin/flaking medial to the right scapula.  Smaller areas noted surrounding left scapular and left anterior shoulder.  No central clearing. Appears consistent with tinea.     Assessment & Plan:  See problem list

## 2013-05-27 ENCOUNTER — Ambulatory Visit: Payer: Self-pay

## 2013-06-04 ENCOUNTER — Ambulatory Visit: Payer: Self-pay

## 2013-09-02 ENCOUNTER — Encounter (HOSPITAL_COMMUNITY): Payer: Self-pay | Admitting: Emergency Medicine

## 2013-09-02 ENCOUNTER — Emergency Department (HOSPITAL_COMMUNITY)
Admission: EM | Admit: 2013-09-02 | Discharge: 2013-09-02 | Disposition: A | Discharge status: Home / Self Care | Payer: No Typology Code available for payment source | Attending: Emergency Medicine | Admitting: Emergency Medicine

## 2013-09-02 DIAGNOSIS — R011 Cardiac murmur, unspecified: Secondary | ICD-10-CM | POA: Insufficient documentation

## 2013-09-02 DIAGNOSIS — Z8701 Personal history of pneumonia (recurrent): Secondary | ICD-10-CM | POA: Insufficient documentation

## 2013-09-02 DIAGNOSIS — R51 Headache: Secondary | ICD-10-CM | POA: Insufficient documentation

## 2013-09-02 DIAGNOSIS — Z9109 Other allergy status, other than to drugs and biological substances: Secondary | ICD-10-CM | POA: Insufficient documentation

## 2013-09-02 DIAGNOSIS — M19019 Primary osteoarthritis, unspecified shoulder: Secondary | ICD-10-CM | POA: Insufficient documentation

## 2013-09-02 DIAGNOSIS — Z8719 Personal history of other diseases of the digestive system: Secondary | ICD-10-CM | POA: Insufficient documentation

## 2013-09-02 DIAGNOSIS — F17200 Nicotine dependence, unspecified, uncomplicated: Secondary | ICD-10-CM | POA: Insufficient documentation

## 2013-09-02 DIAGNOSIS — Z8679 Personal history of other diseases of the circulatory system: Secondary | ICD-10-CM | POA: Insufficient documentation

## 2013-09-02 MED ORDER — SODIUM CHLORIDE 0.9 % IV BOLUS (SEPSIS)
1000.0000 mL | Freq: Once | INTRAVENOUS | Status: AC
  Administered 2013-09-02: 1000 mL via INTRAVENOUS

## 2013-09-02 MED ORDER — METOCLOPRAMIDE HCL 5 MG/ML IJ SOLN
20.0000 mg | Freq: Once | INTRAVENOUS | Status: AC
  Administered 2013-09-02: 20 mg via INTRAVENOUS
  Filled 2013-09-02: qty 4

## 2013-09-02 MED ORDER — DIPHENHYDRAMINE HCL 50 MG/ML IJ SOLN
25.0000 mg | Freq: Once | INTRAMUSCULAR | Status: AC
  Administered 2013-09-02: 25 mg via INTRAVENOUS
  Filled 2013-09-02: qty 1

## 2013-09-02 MED ORDER — LORAZEPAM 1 MG PO TABS: 1.0000 mg | ORAL_TABLET | Freq: Three times a day (TID) | ORAL | Status: DC | PRN

## 2013-09-02 MED ORDER — LORAZEPAM 2 MG/ML IJ SOLN
1.0000 mg | Freq: Once | INTRAMUSCULAR | Status: AC
  Administered 2013-09-02: 1 mg via INTRAVENOUS
  Filled 2013-09-02: qty 1

## 2013-09-02 MED ORDER — DEXAMETHASONE SODIUM PHOSPHATE 10 MG/ML IJ SOLN
10.0000 mg | Freq: Once | INTRAMUSCULAR | Status: AC
  Administered 2013-09-02: 10 mg via INTRAVENOUS
  Filled 2013-09-02: qty 1

## 2013-09-02 NOTE — ED Notes (Signed)
Bed: WU98 Expected date: 09/02/13 Expected time: 6:37 PM Means of arrival: Ambulance Comments: headache

## 2013-09-02 NOTE — ED Provider Notes (Signed)
CSN: [id number]     Arrival date & time 09/02/13  1858 History   First MD Initiated Contact with Patient 09/02/13 1859     Chief Complaint  Patient presents with  . Headache   (Consider location/radiation/quality/duration/timing/severity/associated sxs/prior Treatment) Patient is a 52 y.o. male presenting with headaches. The history is provided by the patient and a parent.  Headache Pain location:  Frontal and occipital Quality:  Dull Radiates to:  Does not radiate Severity currently:  Unable to specify Severity at highest:  10/10 Onset quality:  Gradual Duration:  3 days Timing:  Intermittent Progression:  Waxing and waning Chronicity:  Recurrent Similar to prior headaches: yes   Context: bright light   Relieved by:  Nothing Worsened by:  Light and activity Ineffective treatments:  NSAIDs Associated symptoms: nausea   Associated symptoms: no blurred vision, no neck pain, no neck stiffness, no visual change and no vomiting     Past Medical History  Diagnosis Date  . Anginal pain   . Heart murmur     "as a child" (06/06/2012)  . Shortness of breath 06/06/2012    "at rest; lying down; w/exertion"  . Dysrhythmia     "palpitations" (06/06/2012)  . Daily headache     "here lately they've been daily" (06/06/2012)  . Migraines     "treated for them in the last 6 months (06/06/2012)  . GERD (gastroesophageal reflux disease)   . External hemorrhoid, bleeding   . Arthritis     "right shoulder" (06/06/2012)  . Seasonal allergies     "outdoor; pollen, grass" (06/06/2012)  . Pneumonia 2012; 06/06/2012   Past Surgical History  Procedure Laterality Date  . Appendectomy  2001   Family History  Problem Relation Age of Onset  . Cancer Father    History  Substance Use Topics  . Smoking status: Current Every Day Smoker -- 1.00 packs/day for 40 years    Types: Cigarettes  . Smokeless tobacco: Never Used  . Alcohol Use: Yes     Comment: 06/06/2012 "History of abuse ; sober since  07/2009"    Review of Systems  Constitutional:       Per HPI, otherwise negative  HENT:       Per HPI, otherwise negative  Eyes: Negative for blurred vision.  Respiratory:       Per HPI, otherwise negative  Cardiovascular:       Per HPI, otherwise negative  Gastrointestinal: Positive for nausea. Negative for vomiting.  Endocrine:       Negative aside from HPI  Genitourinary:       Neg aside from HPI   Musculoskeletal: Negative for neck pain and neck stiffness.       Per HPI, otherwise negative  Skin: Negative.   Neurological: Positive for headaches. Negative for syncope.    Allergies  Review of patient's allergies indicates no known allergies.  Home Medications   Current Outpatient Rx  Name  Route  Sig  Dispense  Refill  . acetaminophen (TYLENOL) 500 MG tablet   Oral   Take 500 mg by mouth every 4 (four) hours as needed. For pain.         Marland Kitchen guaiFENesin (MUCINEX) 600 MG 12 hr tablet   Oral   Take 600 mg by mouth 2 (two) times daily.         Marland Kitchen ibuprofen (ADVIL,MOTRIN) 200 MG tablet   Oral   Take 200 mg by mouth every 6 (six) hours as needed for fever.         Marland Kitchen  Phenylephrine-DM-GG-APAP (TYLENOL COLD MULTI-SYMPTOM DAY) 5-10-200-325 MG/15ML LIQD   Oral   Take 30 mLs by mouth daily as needed (flu symptoms.).          BP 129/84  Pulse 53  Temp(Src) 97.7 F (36.5 C) (Oral)  Resp 18  SpO2 96% Physical Exam  Nursing note and vitals reviewed. Constitutional: He is oriented to person, place, and time. He appears well-developed. No distress.  HENT:  Head: Normocephalic and atraumatic.  Eyes: Conjunctivae and EOM are normal.  Neck: Full passive range of motion without pain. Neck supple.  Cardiovascular: Normal rate and regular rhythm.   Pulmonary/Chest: Effort normal. No stridor. No respiratory distress.  Abdominal: He exhibits no distension.  Musculoskeletal: He exhibits no edema.  Neurological: He is alert and oriented to person, place, and time.  Skin:  Skin is warm and dry.  Psychiatric: He has a normal mood and affect.    ED Course  Procedures (including critical care time) Labs Review Labs Reviewed - No data to display Imaging Review No results found.  EKG Interpretation   None      After the initial evaluation I reviewed the patient's chart.  10:40 PM Patient presents today as after initial analgesics, but he continues to complain of anxiety.  No other new complaints.  Update: Patient appears comfortable. MDM  No diagnosis found. Patient presents with concerns headache.  Patient's history migraines, described in a typical distribution.  Patient has no neurologic deficiencies, is not grossly infected.  Patient had resolution of his pain here and was appropriate for discharge with further evaluation and management as an outpatient.    Gerhard Munch, MD 09/02/13 [phone]

## 2013-09-02 NOTE — ED Notes (Signed)
Per EMS: pt was seen for flu on 25th. C/o headache x 3 days. 18 g left AC given 4 mg Zofran in route for nausea.

## 2014-10-03 ENCOUNTER — Emergency Department (HOSPITAL_COMMUNITY)
Admission: EM | Admit: 2014-10-03 | Discharge: 2014-10-04 | Disposition: A | Discharge status: Home / Self Care | Payer: Self-pay | Attending: Emergency Medicine | Admitting: Emergency Medicine

## 2014-10-03 ENCOUNTER — Emergency Department (HOSPITAL_COMMUNITY): Payer: Self-pay

## 2014-10-03 ENCOUNTER — Encounter (HOSPITAL_COMMUNITY): Payer: Self-pay | Admitting: Emergency Medicine

## 2014-10-03 DIAGNOSIS — I209 Angina pectoris, unspecified: Secondary | ICD-10-CM | POA: Insufficient documentation

## 2014-10-03 DIAGNOSIS — M13811 Other specified arthritis, right shoulder: Secondary | ICD-10-CM | POA: Insufficient documentation

## 2014-10-03 DIAGNOSIS — Z720 Tobacco use: Secondary | ICD-10-CM | POA: Insufficient documentation

## 2014-10-03 DIAGNOSIS — Z8701 Personal history of pneumonia (recurrent): Secondary | ICD-10-CM | POA: Insufficient documentation

## 2014-10-03 DIAGNOSIS — R011 Cardiac murmur, unspecified: Secondary | ICD-10-CM | POA: Insufficient documentation

## 2014-10-03 DIAGNOSIS — M542 Cervicalgia: Secondary | ICD-10-CM | POA: Insufficient documentation

## 2014-10-03 DIAGNOSIS — Z8719 Personal history of other diseases of the digestive system: Secondary | ICD-10-CM | POA: Insufficient documentation

## 2014-10-03 DIAGNOSIS — R079 Chest pain, unspecified: Secondary | ICD-10-CM | POA: Insufficient documentation

## 2014-10-03 DIAGNOSIS — Z7982 Long term (current) use of aspirin: Secondary | ICD-10-CM | POA: Insufficient documentation

## 2014-10-03 LAB — COMPREHENSIVE METABOLIC PANEL
ALBUMIN: 4.3 g/dL (ref 3.5–5.2)
ALT: 17 U/L (ref 0–53)
AST: 17 U/L (ref 0–37)
Alkaline Phosphatase: 61 U/L (ref 39–117)
Anion gap: 9 (ref 5–15)
BILIRUBIN TOTAL: 0.5 mg/dL (ref 0.3–1.2)
BUN: 14 mg/dL (ref 6–23)
CO2: 24 mmol/L (ref 19–32)
Calcium: 9 mg/dL (ref 8.4–10.5)
Chloride: 103 mmol/L (ref 96–112)
Creatinine, Ser: 0.89 mg/dL (ref 0.50–1.35)
GFR calc Af Amer: >90 mL/min (ref >90)
GFR calc non Af Amer: >90 mL/min (ref >90)
GLUCOSE: 100 mg/dL — AB (ref 70–99)
Potassium: 4.1 mmol/L (ref 3.5–5.1)
SODIUM: 136 mmol/L (ref 135–145)
TOTAL PROTEIN: 7.3 g/dL (ref 6.0–8.3)

## 2014-10-03 LAB — I-STAT TROPONIN, ED: Troponin i, poc: 0 ng/mL (ref 0.00–0.08)

## 2014-10-03 LAB — URINALYSIS, ROUTINE W REFLEX MICROSCOPIC
BILIRUBIN URINE: NEGATIVE
GLUCOSE, UA: NEGATIVE mg/dL
Hgb urine dipstick: NEGATIVE
KETONES UR: NEGATIVE mg/dL
LEUKOCYTES UA: NEGATIVE
Nitrite: NEGATIVE
PH: 6 (ref 5.0–8.0)
Protein, ur: NEGATIVE mg/dL
Specific Gravity, Urine: 1.018 (ref 1.005–1.030)
Urobilinogen, UA: 0.2 mg/dL (ref 0.0–1.0)

## 2014-10-03 LAB — CBC
HEMATOCRIT: 46.5 % (ref 39.0–52.0)
Hemoglobin: 15.8 g/dL (ref 13.0–17.0)
MCH: 29.2 pg (ref 26.0–34.0)
MCHC: 34 g/dL (ref 30.0–36.0)
MCV: 86 fL (ref 78.0–100.0)
Platelets: 271 10*3/uL (ref 150–400)
RBC: 5.41 MIL/uL (ref 4.22–5.81)
RDW: 13.7 % (ref 11.5–15.5)
WBC: 10.3 10*3/uL (ref 4.0–10.5)

## 2014-10-03 MED ORDER — SODIUM CHLORIDE 0.9 % IV BOLUS (SEPSIS)
1000.0000 mL | Freq: Once | INTRAVENOUS | Status: AC
  Administered 2014-10-04: 1000 mL via INTRAVENOUS
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
## 2014-10-03 NOTE — ED Notes (Signed)
Pt reports left arm numbness starting 3-4 days ago. Has had previous neck injury and thinks this is a "pinched nerve." C/o associated left lateral chest pain radiating to the back. Says he feels intermittent dizziness and SOB. Took Aspirin this morning and ibuprofen also with no alleviation of symptoms. Also has "occasional striking pain in my leg." No cardiac history. No other c/c. RR even/unlabored. Face symmetrical. Ambulatory with steady gait. Speaking full/clear sentences.

## 2014-10-04 LAB — I-STAT TROPONIN, ED: Troponin i, poc: 0.01 ng/mL (ref 0.00–0.08)

## 2014-10-04 MED ORDER — IOHEXOL 350 MG/ML SOLN
100.0000 mL | Freq: Once | INTRAVENOUS | Status: AC | PRN
  Administered 2014-10-04: 100 mL via INTRAVENOUS

## 2014-10-04 NOTE — Discharge Instructions (Signed)
Cervical Sprain Ethan Williams, your heart markers, EKG, chest x-ray were normal. Your CT scan did not show a cause for your neck pain and arm tingling. Follow-up with a primary care physician within 3 days for continued management. If symptoms worsen come back to emergency department immediately. Thank you. A cervical sprain is when the tissues (ligaments) that hold the neck bones in place stretch or tear. HOME CARE   Put ice on the injured area.  Put ice in a plastic bag.  Place a towel between your skin and the bag.  Leave the ice on for 15-20 minutes, 3-4 times a day.  You may have been given a collar to wear. This collar keeps your neck from moving while you heal.  Do not take the collar off unless told by your doctor.  If you have long hair, keep it outside of the collar.  Ask your doctor before changing the position of your collar. You may need to change its position over time to make it more comfortable.  If you are allowed to take off the collar for cleaning or bathing, follow your doctor's instructions on how to do it safely.  Keep your collar clean by wiping it with mild soap and water. Dry it completely. If the collar has removable pads, remove them every 1-2 days to hand wash them with soap and water. Allow them to air dry. They should be dry before you wear them in the collar.  Do not drive while wearing the collar.  Only take medicine as told by your doctor.  Keep all doctor visits as told.  Keep all physical therapy visits as told.  Adjust your work station so that you have good posture while you work.  Avoid positions and activities that make your problems worse.  Warm up and stretch before being active. GET HELP IF:  Your pain is not controlled with medicine.  You cannot take less pain medicine over time as planned.  Your activity level does not improve as expected. GET HELP RIGHT AWAY IF:   You are bleeding.  Your stomach is upset.  You have an  allergic reaction to your medicine.  You develop new problems that you cannot explain.  You lose feeling (become numb) or you cannot move any part of your body (paralysis).  You have tingling or weakness in any part of your body.  Your symptoms get worse. Symptoms include:  Pain, soreness, stiffness, puffiness (swelling), or a burning feeling in your neck.  Pain when your neck is touched.  Shoulder or upper back pain.  Limited ability to move your neck.  Headache.  Dizziness.  Your hands or arms feel week, lose feeling, or tingle.  Muscle spasms.  Difficulty swallowing or chewing. MAKE SURE YOU:   Understand these instructions.  Will watch your condition.  Will get help right away if you are not doing well or get worse. Document Released: 02/07/2008 Document Revised: 04/23/2013 Document Reviewed: 02/26/2013 [facility] Patient Information 2015 [place], [place]. This information is not intended to replace advice given to you by your health care provider. Make sure you discuss any questions you have with your health care provider. Chest Pain (Nonspecific) It is often hard to give a diagnosis for the cause of chest pain. There is always a chance that your pain could be related to something serious, such as a heart attack or a blood clot in the lungs. You need to follow up with your doctor. HOME CARE  If antibiotic medicine  was given, take it as directed by your doctor. Finish the medicine even if you start to feel better.  For the next few days, avoid activities that bring on chest pain. Continue physical activities as told by your doctor.  Do not use any tobacco products. This includes cigarettes, chewing tobacco, and e-cigarettes.  Avoid drinking alcohol.  Only take medicine as told by your doctor.  Follow your doctor's suggestions for more testing if your chest pain does not go away.  Keep all doctor visits you made. GET HELP IF:  Your chest pain does not go away,  even after treatment.  You have a rash with blisters on your chest.  You have a fever. GET HELP RIGHT AWAY IF:   You have more pain or pain that spreads to your arm, neck, jaw, back, or belly (abdomen).  You have shortness of breath.  You cough more than usual or cough up blood.  You have very bad back or belly pain.  You feel sick to your stomach (nauseous) or throw up (vomit).  You have very bad weakness.  You pass out (faint).  You have chills. This is an emergency. Do not wait to see if the problems will go away. Call your local emergency services (911 in [place] Do not drive yourself to the hospital. MAKE SURE YOU:   Understand these instructions.  Will watch your condition.  Will get help right away if you are not doing well or get worse. Document Released: 02/07/2008 Document Revised: 08/26/2013 Document Reviewed: 02/07/2008 [facility] Patient Information 2015 [name], [place]. This information is not intended to replace advice given to you by your health care provider. Make sure you discuss any questions you have with your health care provider.

## 2015-04-17 ENCOUNTER — Emergency Department (HOSPITAL_COMMUNITY)
Admission: EM | Admit: 2015-04-17 | Discharge: 2015-04-17 | Disposition: A | Discharge status: Home / Self Care | Payer: No Typology Code available for payment source | Attending: Emergency Medicine | Admitting: Emergency Medicine

## 2015-04-17 ENCOUNTER — Encounter (HOSPITAL_COMMUNITY): Payer: Self-pay | Admitting: Oncology

## 2015-04-17 ENCOUNTER — Emergency Department (HOSPITAL_COMMUNITY): Payer: No Typology Code available for payment source

## 2015-04-17 DIAGNOSIS — K219 Gastro-esophageal reflux disease without esophagitis: Secondary | ICD-10-CM | POA: Diagnosis not present

## 2015-04-17 DIAGNOSIS — G43909 Migraine, unspecified, not intractable, without status migrainosus: Secondary | ICD-10-CM | POA: Diagnosis not present

## 2015-04-17 DIAGNOSIS — Z720 Tobacco use: Secondary | ICD-10-CM | POA: Insufficient documentation

## 2015-04-17 DIAGNOSIS — R011 Cardiac murmur, unspecified: Secondary | ICD-10-CM | POA: Diagnosis not present

## 2015-04-17 DIAGNOSIS — Y92410 Unspecified street and highway as the place of occurrence of the external cause: Secondary | ICD-10-CM | POA: Diagnosis not present

## 2015-04-17 DIAGNOSIS — I2510 Atherosclerotic heart disease of native coronary artery without angina pectoris: Secondary | ICD-10-CM | POA: Insufficient documentation

## 2015-04-17 DIAGNOSIS — S161XXA Strain of muscle, fascia and tendon at neck level, initial encounter: Secondary | ICD-10-CM | POA: Diagnosis not present

## 2015-04-17 DIAGNOSIS — Z8701 Personal history of pneumonia (recurrent): Secondary | ICD-10-CM | POA: Diagnosis not present

## 2015-04-17 DIAGNOSIS — S99922A Unspecified injury of left foot, initial encounter: Secondary | ICD-10-CM | POA: Diagnosis present

## 2015-04-17 DIAGNOSIS — S4991XA Unspecified injury of right shoulder and upper arm, initial encounter: Secondary | ICD-10-CM | POA: Insufficient documentation

## 2015-04-17 DIAGNOSIS — M1990 Unspecified osteoarthritis, unspecified site: Secondary | ICD-10-CM | POA: Insufficient documentation

## 2015-04-17 DIAGNOSIS — R200 Anesthesia of skin: Secondary | ICD-10-CM | POA: Diagnosis not present

## 2015-04-17 DIAGNOSIS — S9032XA Contusion of left foot, initial encounter: Secondary | ICD-10-CM | POA: Diagnosis not present

## 2015-04-17 DIAGNOSIS — Y9389 Activity, other specified: Secondary | ICD-10-CM | POA: Diagnosis not present

## 2015-04-17 DIAGNOSIS — Z7982 Long term (current) use of aspirin: Secondary | ICD-10-CM | POA: Insufficient documentation

## 2015-04-17 DIAGNOSIS — Y998 Other external cause status: Secondary | ICD-10-CM | POA: Diagnosis not present

## 2015-04-17 MED ORDER — IBUPROFEN 600 MG PO TABS: 600.0000 mg | ORAL_TABLET | Freq: Once | ORAL | Status: DC

## 2015-04-17 MED ORDER — CYCLOBENZAPRINE HCL 10 MG PO TABS
5.0000 mg | ORAL_TABLET | Freq: Once | ORAL | Status: AC
  Administered 2015-04-17: 5 mg via ORAL
  Filled 2015-04-17: qty 1

## 2015-04-17 MED ORDER — CYCLOBENZAPRINE HCL 5 MG PO TABS: 5.0000 mg | ORAL_TABLET | Freq: Once | ORAL | Status: DC

## 2015-04-17 MED ORDER — IBUPROFEN 200 MG PO TABS
600.0000 mg | ORAL_TABLET | Freq: Once | ORAL | Status: AC
  Administered 2015-04-17: 600 mg via ORAL
  Filled 2015-04-17: qty 3
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
## 2015-04-17 NOTE — Discharge Instructions (Signed)
Cervical Strain and Sprain (Whiplash) °with Rehab °Cervical strain and sprain are injuries that commonly occur with "whiplash" injuries. Whiplash occurs when the neck is forcefully whipped backward or forward, such as during a motor vehicle accident or during contact sports. The muscles, ligaments, tendons, discs, and nerves of the neck are susceptible to injury when this occurs. °RISK FACTORS °Risk of having a whiplash injury increases if: °· Osteoarthritis of the spine. °· Situations that make head or neck accidents or trauma more likely. °· High-risk sports (football, rugby, wrestling, hockey, auto racing, gymnastics, diving, contact karate, or boxing). °· Poor strength and flexibility of the neck. °· Previous neck injury. °· Poor tackling technique. °· Improperly fitted or padded equipment. °SYMPTOMS  °· Pain or stiffness in the front or back of neck or both. °· Symptoms may present immediately or up to 24 hours after injury. °· Dizziness, headache, nausea, and vomiting. °· Muscle spasm with soreness and stiffness in the neck. °· Tenderness and swelling at the injury site. °PREVENTION °· Learn and use proper technique (avoid tackling with the head, spearing, and head-butting; use proper falling techniques to avoid landing on the head). °· Warm up and stretch properly before activity. °· Maintain physical fitness: °· Strength, flexibility, and endurance. °· Cardiovascular fitness. °· Wear properly fitted and padded protective equipment, such as padded soft collars, for participation in contact sports. °PROGNOSIS  °Recovery from cervical strain and sprain injuries is dependent on the extent of the injury. These injuries are usually curable in 1 week to 3 months with appropriate treatment.  °RELATED COMPLICATIONS  °· Temporary numbness and weakness may occur if the nerve roots are damaged, and this may persist until the nerve has completely healed. °· Chronic pain due to frequent recurrence of  symptoms. °· Prolonged healing, especially if activity is resumed too soon (before complete recovery). °TREATMENT  °Treatment initially involves the use of ice and medication to help reduce pain and inflammation. It is also important to perform strengthening and stretching exercises and modify activities that worsen symptoms so the injury does not get worse. These exercises may be performed at home or with a therapist. For patients who experience severe symptoms, a soft, padded collar may be recommended to be worn around the neck.  °Improving your posture may help reduce symptoms. Posture improvement includes pulling your chin and abdomen in while sitting or standing. If you are sitting, sit in a firm chair with your buttocks against the back of the chair. While sleeping, try replacing your pillow with a small towel rolled to 2 inches in diameter, or use a cervical pillow or soft cervical collar. Poor sleeping positions delay healing.  °For patients with nerve root damage, which causes numbness or weakness, the use of a cervical traction apparatus may be recommended. Surgery is rarely necessary for these injuries. However, cervical strain and sprains that are present at birth (congenital) may require surgery. °MEDICATION  °· If pain medication is necessary, nonsteroidal anti-inflammatory medications, such as aspirin and ibuprofen, or other minor pain relievers, such as acetaminophen, are often recommended. °· Do not take pain medication for 7 days before surgery. °· Prescription pain relievers may be given if deemed necessary by your caregiver. Use only as directed and only as much as you need. °HEAT AND COLD:  °· Cold treatment (icing) relieves pain and reduces inflammation. Cold treatment should be applied for 10 to 15 minutes every 2 to 3 hours for inflammation and pain and immediately after any activity that aggravates   your symptoms. Use ice packs or an ice massage. °· Heat treatment may be used prior to  performing the stretching and strengthening activities prescribed by your caregiver, physical therapist, or athletic trainer. Use a heat pack or a warm soak. °SEEK MEDICAL CARE IF:  °· Symptoms get worse or do not improve in 2 weeks despite treatment. °· New, unexplained symptoms develop (drugs used in treatment may produce side effects). °EXERCISES °RANGE OF MOTION (ROM) AND STRETCHING EXERCISES - Cervical Strain and Sprain °These exercises may help you when beginning to rehabilitate your injury. In order to successfully resolve your symptoms, you must improve your posture. These exercises are designed to help reduce the forward-head and rounded-shoulder posture which contributes to this condition. Your symptoms may resolve with or without further involvement from your physician, physical therapist or athletic trainer. While completing these exercises, remember:  °· Restoring tissue flexibility helps normal motion to return to the joints. This allows healthier, less painful movement and activity. °· An effective stretch should be held for at least 20 seconds, although you may need to begin with shorter hold times for comfort. °· A stretch should never be painful. You should only feel a gentle lengthening or release in the stretched tissue. °STRETCH- Axial Extensors °· Lie on your back on the floor. You may bend your knees for comfort. Place a rolled-up hand towel or dish towel, about 2 inches in diameter, under the part of your head that makes contact with the floor. °· Gently tuck your chin, as if trying to make a "double chin," until you feel a gentle stretch at the base of your head. °· Hold __________ seconds. °Repeat __________ times. Complete this exercise __________ times per day.  °STRETCH - Axial Extension  °· Stand or sit on a firm surface. Assume a good posture: chest up, shoulders drawn back, abdominal muscles slightly tense, knees unlocked (if standing) and feet hip width apart. °· Slowly retract your  chin so your head slides back and your chin slightly lowers. Continue to look straight ahead. °· You should feel a gentle stretch in the back of your head. Be certain not to feel an aggressive stretch since this can cause headaches later. °· Hold for __________ seconds. °Repeat __________ times. Complete this exercise __________ times per day. °STRETCH - Cervical Side Bend  °· Stand or sit on a firm surface. Assume a good posture: chest up, shoulders drawn back, abdominal muscles slightly tense, knees unlocked (if standing) and feet hip width apart. °· Without letting your nose or shoulders move, slowly tip your right / left ear to your shoulder until your feel a gentle stretch in the muscles on the opposite side of your neck. °· Hold __________ seconds. °Repeat __________ times. Complete this exercise __________ times per day. °STRETCH - Cervical Rotators  °· Stand or sit on a firm surface. Assume a good posture: chest up, shoulders drawn back, abdominal muscles slightly tense, knees unlocked (if standing) and feet hip width apart. °· Keeping your eyes level with the ground, slowly turn your head until you feel a gentle stretch along the back and opposite side of your neck. °· Hold __________ seconds. °Repeat __________ times. Complete this exercise __________ times per day. °RANGE OF MOTION - Neck Circles  °· Stand or sit on a firm surface. Assume a good posture: chest up, shoulders drawn back, abdominal muscles slightly tense, knees unlocked (if standing) and feet hip width apart. °· Gently roll your head down and around from the   back of one shoulder to the back of the other. The motion should never be forced or painful.  Repeat the motion 10-20 times, or until you feel the neck muscles relax and loosen. Repeat __________ times. Complete the exercise __________ times per day. STRENGTHENING EXERCISES - Cervical Strain and Sprain These exercises may help you when beginning to rehabilitate your injury. They may  resolve your symptoms with or without further involvement from your physician, physical therapist, or athletic trainer. While completing these exercises, remember:   Muscles can gain both the endurance and the strength needed for everyday activities through controlled exercises.  Complete these exercises as instructed by your physician, physical therapist, or athletic trainer. Progress the resistance and repetitions only as guided.  You may experience muscle soreness or fatigue, but the pain or discomfort you are trying to eliminate should never worsen during these exercises. If this pain does worsen, stop and make certain you are following the directions exactly. If the pain is still present after adjustments, discontinue the exercise until you can discuss the trouble with your clinician. STRENGTH - Cervical Flexors, Isometric  Face a wall, standing about 6 inches away. Place a small pillow, a ball about 6-8 inches in diameter, or a folded towel between your forehead and the wall.  Slightly tuck your chin and gently push your forehead into the soft object. Push only with mild to moderate intensity, building up tension gradually. Keep your jaw and forehead relaxed.  Hold 10 to 20 seconds. Keep your breathing relaxed.  Release the tension slowly. Relax your neck muscles completely before you start the next repetition. Repeat __________ times. Complete this exercise __________ times per day. STRENGTH- Cervical Lateral Flexors, Isometric   Stand about 6 inches away from a wall. Place a small pillow, a ball about 6-8 inches in diameter, or a folded towel between the side of your head and the wall.  Slightly tuck your chin and gently tilt your head into the soft object. Push only with mild to moderate intensity, building up tension gradually. Keep your jaw and forehead relaxed.  Hold 10 to 20 seconds. Keep your breathing relaxed.  Release the tension slowly. Relax your neck muscles completely  before you start the next repetition. Repeat __________ times. Complete this exercise __________ times per day. STRENGTH - Cervical Extensors, Isometric   Stand about 6 inches away from a wall. Place a small pillow, a ball about 6-8 inches in diameter, or a folded towel between the back of your head and the wall.  Slightly tuck your chin and gently tilt your head back into the soft object. Push only with mild to moderate intensity, building up tension gradually. Keep your jaw and forehead relaxed.  Hold 10 to 20 seconds. Keep your breathing relaxed.  Release the tension slowly. Relax your neck muscles completely before you start the next repetition. Repeat __________ times. Complete this exercise __________ times per day. POSTURE AND BODY MECHANICS CONSIDERATIONS - Cervical Strain and Sprain Keeping correct posture when sitting, standing or completing your activities will reduce the stress put on different body tissues, allowing injured tissues a chance to heal and limiting painful experiences. The following are general guidelines for improved posture. Your physician or physical therapist will provide you with any instructions specific to your needs. While reading these guidelines, remember:  The exercises prescribed by your provider will help you have the flexibility and strength to maintain correct postures.  The correct posture provides the optimal environment for your joints to  work. All of your joints have less wear and tear when properly supported by a spine with good posture. This means you will experience a healthier, less painful body.  Correct posture must be practiced with all of your activities, especially prolonged sitting and standing. Correct posture is as important when doing repetitive low-stress activities (typing) as it is when doing a single heavy-load activity (lifting). PROLONGED STANDING WHILE SLIGHTLY LEANING FORWARD When completing a task that requires you to lean  forward while standing in one place for a long time, place either foot up on a stationary 2- to 4-inch high object to help maintain the best posture. When both feet are on the ground, the low back tends to lose its slight inward curve. If this curve flattens (or becomes too large), then the back and your other joints will experience too much stress, fatigue more quickly, and can cause pain.  RESTING POSITIONS Consider which positions are most painful for you when choosing a resting position. If you have pain with flexion-based activities (sitting, bending, stooping, squatting), choose a position that allows you to rest in a less flexed posture. You would want to avoid curling into a fetal position on your side. If your pain worsens with extension-based activities (prolonged standing, working overhead), avoid resting in an extended position such as sleeping on your stomach. Most people will find more comfort when they rest with their spine in a more neutral position, neither too rounded nor too arched. Lying on a non-sagging bed on your side with a pillow between your knees, or on your back with a pillow under your knees will often provide some relief. Keep in mind, being in any one position for a prolonged period of time, no matter how correct your posture, can still lead to stiffness. WALKING Walk with an upright posture. Your ears, shoulders, and hips should all line up. OFFICE WORK When working at a desk, create an environment that supports good, upright posture. Without extra support, muscles fatigue and lead to excessive strain on joints and other tissues. CHAIR:  A chair should be able to slide under your desk when your back makes contact with the back of the chair. This allows you to work closely.  The chair's height should allow your eyes to be level with the upper part of your monitor and your hands to be slightly lower than your elbows.  Body position:  Your feet should make contact with the  floor. If this is not possible, use a foot rest.  Keep your ears over your shoulders. This will reduce stress on your neck and low back. Document Released: 08/21/2005 Document Revised: 01/05/2014 Document Reviewed: 12/03/2008 [facility] Patient Information 2015 [place], [place]. This information is not intended to replace advice given to you by your health care provider. Make sure you discuss any questions you have with your health care provider.  Contusion A contusion is a deep bruise. Contusions happen when an injury causes bleeding under the skin. Signs of bruising include pain, puffiness (swelling), and discolored skin. The contusion may turn blue, purple, or yellow. HOME CARE   Put ice on the injured area.  Put ice in a plastic bag.  Place a towel between your skin and the bag.  Leave the ice on for 15-20 minutes, 03-04 times a day.  Only take medicine as told by your doctor.  Rest the injured area.  If possible, raise (elevate) the injured area to lessen puffiness. GET HELP RIGHT AWAY IF:   You  have more bruising or puffiness.  You have pain that is getting worse.  Your puffiness or pain is not helped by medicine. MAKE SURE YOU:   Understand these instructions.  Will watch your condition.  Will get help right away if you are not doing well or get worse. Document Released: 02/07/2008 Document Revised: 11/13/2011 Document Reviewed: 06/26/2011 [facility] Patient Information 2015 [place], [place]. This information is not intended to replace advice given to you by your health care provider. Make sure you discuss any questions you have with your health care provider. As discussed, your x-ray is normal, not revealing any fractures or subluxations of the vertebral column.  You have been given a muscle relaxer, as well as an anti-inflammatory.  Please take this on a regular basis for the next several days.  Follow-up with your primary care doctor
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
## 2016-10-25 ENCOUNTER — Ambulatory Visit: Payer: Self-pay | Admitting: Family Medicine

## 2016-11-08 ENCOUNTER — Encounter: Payer: Self-pay | Admitting: Family Medicine

## 2016-11-08 ENCOUNTER — Ambulatory Visit (INDEPENDENT_AMBULATORY_CARE_PROVIDER_SITE_OTHER): Payer: Medicaid Other | Admitting: Family Medicine

## 2016-11-08 VITALS — BP 132/78 | HR 81 | Temp 97.5°F | Ht 71.0 in | Wt 199.6 lb

## 2016-11-08 DIAGNOSIS — Z113 Encounter for screening for infections with a predominantly sexual mode of transmission: Secondary | ICD-10-CM

## 2016-11-08 DIAGNOSIS — Z0000 Encounter for general adult medical examination without abnormal findings: Secondary | ICD-10-CM

## 2016-11-08 DIAGNOSIS — R32 Unspecified urinary incontinence: Secondary | ICD-10-CM | POA: Diagnosis not present

## 2016-11-08 DIAGNOSIS — N3943 Post-void dribbling: Secondary | ICD-10-CM

## 2016-11-08 LAB — COMPLETE METABOLIC PANEL WITH GFR
ALT: 20 U/L (ref 9–46)
AST: 14 U/L (ref 10–35)
Albumin: 4.3 g/dL (ref 3.6–5.1)
Alkaline Phosphatase: 59 U/L (ref 40–115)
BUN: 13 mg/dL (ref 7–25)
CALCIUM: 9.5 mg/dL (ref 8.6–10.3)
CO2: 28 mmol/L (ref 20–31)
CREATININE: 0.97 mg/dL (ref 0.70–1.33)
Chloride: 102 mmol/L (ref 98–110)
GFR, EST NON AFRICAN AMERICAN: 87 mL/min (ref >=60)
Glucose, Bld: 91 mg/dL (ref 65–99)
Potassium: 4.2 mmol/L (ref 3.5–5.3)
Sodium: 138 mmol/L (ref 135–146)
Total Bilirubin: 0.4 mg/dL (ref 0.2–1.2)
Total Protein: 7.1 g/dL (ref 6.1–8.1)

## 2016-11-08 LAB — CBC
HCT: 45.4 % (ref 38.5–50.0)
Hemoglobin: 15.1 g/dL (ref 13.2–17.1)
MCH: 28.7 pg (ref 27.0–33.0)
MCHC: 33.3 g/dL (ref 32.0–36.0)
MCV: 86.1 fL (ref 80.0–100.0)
MPV: 9.4 fL (ref 7.5–12.5)
Platelets: 288 10*3/uL (ref 140–400)
RBC: 5.27 MIL/uL (ref 4.20–5.80)
RDW: 14.9 % (ref 11.0–15.0)
WBC: 12.3 10*3/uL — AB (ref 3.8–10.8)

## 2016-11-08 LAB — POCT URINALYSIS DIPSTICK
BILIRUBIN UA: NEGATIVE
Blood, UA: NEGATIVE
Glucose, UA: NEGATIVE
KETONES UA: 15
Leukocytes, UA: NEGATIVE
Nitrite, UA: NEGATIVE
Protein, UA: NEGATIVE
SPEC GRAV UA: 1.025
Urobilinogen, UA: 0.2
pH, UA: 6

## 2016-11-08 MED ORDER — TAMSULOSIN HCL 0.4 MG PO CAPS: 0.4000 mg | ORAL_CAPSULE | Freq: Every day | ORAL | 3 refills | Status: DC

## 2016-11-08 NOTE — Assessment & Plan Note (Addendum)
Patient noticed incomplete emptying of bladder for several months. He hasn't had this worked up before. Gave I-PSS BPH scoring system - patient scored 15, reflecting moderate concern. Started flomax. UA without concerns for infection.

## 2016-11-08 NOTE — Patient Instructions (Addendum)
It was a pleasure to see you today! Thank you for choosing [facility] for your primary care. Ethan Williams was seen for new patient visit. Come back to the clinic in 6 months. I will call you if your labs are abnormal. I sent a medicine to your pharmacy for your bladder. You will have to call around to look for the cheapest price for an eye doctor and a dentist, we aren't able to refer you through medicaid.   Best,  Dr. Chanetta Marshallimberlake

## 2016-11-08 NOTE — Progress Notes (Signed)
   CC: new patient  HPI Recently got medicaid. Not new here (saw Everlene OtherJayce Cook), but recently getting back into the health system . Felt he had the flu last week - not tested and used symptomatic treatment at home. Didn't get the flu shot this year.   Notes bladder discomfort starting months ago (insiduious onset). Notices dribbling, small voids, and frequent urination. Denies fever, no penile discharge. Would like STI testing. No known prostate issues.  Reports only medical history as GERD, arthritis.   Health maintenance: Wants optho referral, dental referral, hasn't had a colonoscopy.  Appendix, arthritis, shoulder surgery (had arthritis)  Meds: zantac, APAP. Lately mucinex.   CC, SH/smoking status, and VS noted  Objective: BP 132/78   Pulse 81   Temp 97.5 F (36.4 C) (Oral)   Ht 5\' 11"  (1.803 m)   Wt 199 lb 9.6 oz (90.5 kg)   SpO2 95%   BMI 27.84 kg/m  Gen: NAD, alert, cooperative, and pleasant.  HEENT: NCAT, EOMI, PERRL CV: RRR, no murmur Resp: CTAB, no wheezes, non-labored Abd: SNTND, BS present, no guarding or organomegaly Ext: No edema, warm Neuro: Alert and oriented, Speech clear, No gross deficits  Assessment and plan:  Urinary incontinence Patient noticed incomplete emptying of bladder for several months. He hasn't had this worked up before. Gave I-PSS BPH scoring system - patient scored 15, reflecting moderate concern. Started flomax. UA without concerns for infection.  Healthcare maintenance We do not refer for optho or dental maintenance. STI testing completed and negative.    Orders Placed This Encounter  Procedures  . COMPLETE METABOLIC PANEL WITH GFR  . CBC  . HIV antibody  . RPR  . Hepatitis C antibody  . Urinalysis Dipstick    Meds ordered this encounter  Medications  . tamsulosin (FLOMAX) 0.4 MG CAPS capsule    Sig: Take 1 capsule (0.4 mg total) by mouth daily.    Dispense:  30 capsule    Refill:  3     Loni MuseKate Demonica Farrey, MD,  PGY1 11/10/2016 5:54 PM

## 2016-11-09 LAB — HEPATITIS C ANTIBODY: HCV AB: NEGATIVE

## 2016-11-09 LAB — RPR

## 2016-11-09 LAB — HIV ANTIBODY (ROUTINE TESTING W REFLEX): HIV 1&2 Ab, 4th Generation: NONREACTIVE

## 2016-11-10 ENCOUNTER — Encounter: Payer: Self-pay | Admitting: Family Medicine

## 2016-11-10 NOTE — Assessment & Plan Note (Signed)
We do not refer for optho or dental maintenance. STI testing completed and negative.

## 2017-06-29 ENCOUNTER — Encounter: Payer: Self-pay | Admitting: Family Medicine

## 2017-06-29 ENCOUNTER — Ambulatory Visit (INDEPENDENT_AMBULATORY_CARE_PROVIDER_SITE_OTHER): Payer: Medicaid Other | Admitting: Family Medicine

## 2017-06-29 VITALS — BP 138/70 | HR 73 | Temp 98.1°F | Wt 197.0 lb

## 2017-06-29 DIAGNOSIS — Z23 Encounter for immunization: Secondary | ICD-10-CM | POA: Diagnosis not present

## 2017-06-29 DIAGNOSIS — N401 Enlarged prostate with lower urinary tract symptoms: Secondary | ICD-10-CM | POA: Diagnosis not present

## 2017-06-29 DIAGNOSIS — R300 Dysuria: Secondary | ICD-10-CM

## 2017-06-29 DIAGNOSIS — R3916 Straining to void: Secondary | ICD-10-CM

## 2017-06-29 DIAGNOSIS — N410 Acute prostatitis: Secondary | ICD-10-CM | POA: Insufficient documentation

## 2017-06-29 LAB — POCT URINALYSIS DIP (MANUAL ENTRY)
Bilirubin, UA: NEGATIVE
GLUCOSE UA: NEGATIVE mg/dL
Ketones, POC UA: NEGATIVE mg/dL
LEUKOCYTES UA: NEGATIVE
NITRITE UA: NEGATIVE
Protein Ur, POC: NEGATIVE mg/dL
RBC UA: NEGATIVE
Spec Grav, UA: 1.02 (ref 1.010–1.025)
Urobilinogen, UA: 0.2 E.U./dL
pH, UA: 6 (ref 5.0–8.0)

## 2017-06-29 MED ORDER — TAMSULOSIN HCL 0.4 MG PO CAPS: 0.4000 mg | ORAL_CAPSULE | Freq: Every day | ORAL | 3 refills | Status: DC

## 2017-06-29 MED ORDER — SULFAMETHOXAZOLE-TRIMETHOPRIM 800-160 MG PO TABS: 1.0000 | ORAL_TABLET | Freq: Two times a day (BID) | ORAL | 0 refills | Status: AC

## 2017-06-29 NOTE — Assessment & Plan Note (Signed)
IPSS score of 32. May have confounding given likely prostatitis. Will start flomax.

## 2017-06-29 NOTE — Progress Notes (Signed)
Subjective:  Ethan Williams is a 56 y.o. male who presents to the [facility] today with a chief complaint of left sided groin pain.   HPI: He has had groin discomfort for several months, but it was significantly worse last night. It does radiate to his back.  He is using OTC tylenol/ibuprofen for pain without improvement. In the past pt has used cranberry supplement and that has worked in the past. But has not used them recently. Patient has a history of opioid abuse, asked not to prescribe opioids medications.  Pain is intermittent. Describes as "striking pain". Walking makes pain worse. Sitting making better. Patient has been taking Advil and tylenol but not helping. He Endorses dysuria, not fully emptying bladder. Night time wakeful ness, decreased stream. No trauma. Denies fevers or chills, no blood in urine, no nausea or vomiting. Denies any trauma to the groin area. At last appointment he was diagnosed with BPH and prescribed flomax. He did not take because he did not realized he had a medication for it. IPSS score today was 32 (severe).   ROS: Per HPI   Objective:  Physical Exam: BP 138/70   Pulse 73   Temp 98.1 F (36.7 C) (Oral)   Wt 197 lb (89.4 kg)   SpO2 99%   BMI 27.48 kg/m   Gen: appears uncomforted, shifting back and forth due to discomfort Abdomen: nontender, nondistended, soft, no CVA tenderness, no evidence of inguinal hernia even with valsalva GU: normal appearing testes, normal penis with no lesion, normal meatis. Testis not tender to palpation Rectum: boggy prostate with no irregular surface, moderately tender to palpation. Normal rectal tone.   Results for orders placed or performed in visit on 06/29/17 (from the past 72 hour(s))  POCT urinalysis dipstick     Status: None   Collection Time: 06/29/17  1:40 PM  Result Value Ref Range   Color, UA yellow yellow   Clarity, UA clear clear   Glucose, UA negative negative mg/dL   Bilirubin, UA negative negative   Ketones, POC UA negative negative mg/dL   Spec Grav, UA 4.098 1.191 - 1.025   Blood, UA negative negative   pH, UA 6.0 5.0 - 8.0   Protein Ur, POC negative negative mg/dL   Urobilinogen, UA 0.2 0.2 or 1.0 E.U./dL   Nitrite, UA Negative Negative   Leukocytes, UA Negative Negative     Assessment/Plan:  Prostatitis, acute Sudden onset of groin pain and tender prostate concerning for prostatitis. Groin pain differential radiating to his back could also be renal stone or complicated UTI. Negative urine dip makes UTI less likely. Pt does not have CVA tenderness. No blood in urine and is still making urine. Will treat prostatitis with 3 weeks of bactrim. Will have patient return. Treatment recommendation range from 3-6 weeks. Consider continuing treatment if does not improve.  If renal stone, given that we are going to start treatment for BPH with flowmax (see below), will additionally advise increased hydration and OTC NSAIDs.   Benign prostatic hyperplasia (BPH) with straining on urination IPSS score of 32. May have confounding given likely prostatitis. Will start flomax.     Lab Orders     POCT urinalysis dipstick  Meds ordered this encounter  Medications  . tamsulosin (FLOMAX) 0.4 MG CAPS capsule    Sig: Take 1 capsule (0.4 mg total) by mouth daily.    Dispense:  30 capsule    Refill:  3  . sulfamethoxazole-trimethoprim (BACTRIM DS,SEPTRA DS) 800-160 MG  tablet    Sig: Take 1 tablet by mouth every 12 (twelve) hours.    Dispense:  42 tablet    Refill:  0    Thomes DinningBrad Bernie Ransford, MD, MS FAMILY MEDICINE RESIDENT - PGY1 06/29/2017 4:39 PM

## 2017-06-29 NOTE — Patient Instructions (Signed)
It was a pleasure to see you today! Thank you for choosing [facility] for your primary care. Ethan Williams was seen for groin pain. You most likely have an infection in your prostate. We are going to start you on 2 new medications. Batrim is an antibiotic to treat the infection. Flomax is a medicine to treat the enlarged prostate. Be sure to drink plenty of fluids. You can take tylenol or ibuprofen as needed for pain. Come back to the clinic in 3 weeks if your pain is not improving. Come back to clinic sooner if you develop fever, vomiting, or if your pain is severely worse.   Best,  Thomes DinningBrad Jazzmyne Rasnick, MD, MS FAMILY MEDICINE RESIDENT - PGY1 06/29/2017 2:25 PM

## 2017-06-29 NOTE — Assessment & Plan Note (Signed)
Sudden onset of groin pain and tender prostate concerning for prostatitis. Groin pain differential radiating to his back could also be renal stone or complicated UTI. Negative urine dip makes UTI less likely. Pt does not have CVA tenderness. No blood in urine and is still making urine. Will treat prostatitis with 3 weeks of bactrim. Will have patient return. Treatment recommendation range from 3-6 weeks. Consider continuing treatment if does not improve.  If renal stone, given that we are going to start treatment for BPH with flowmax (see below), will additionally advise increased hydration and OTC NSAIDs.

## 2018-04-15 ENCOUNTER — Ambulatory Visit: Payer: Self-pay | Admitting: Student in an Organized Health Care Education/Training Program

## 2018-04-16 ENCOUNTER — Encounter (HOSPITAL_COMMUNITY): Payer: Self-pay | Admitting: Emergency Medicine

## 2018-04-16 ENCOUNTER — Ambulatory Visit: Payer: Self-pay | Admitting: Family Medicine

## 2018-04-16 ENCOUNTER — Emergency Department (HOSPITAL_COMMUNITY)
Admission: EM | Admit: 2018-04-16 | Discharge: 2018-04-16 | Disposition: A | Discharge status: Home / Self Care | Payer: Medicaid Other | Attending: Emergency Medicine | Admitting: Emergency Medicine

## 2018-04-16 ENCOUNTER — Emergency Department (HOSPITAL_COMMUNITY): Payer: Medicaid Other

## 2018-04-16 DIAGNOSIS — R3916 Straining to void: Secondary | ICD-10-CM | POA: Diagnosis not present

## 2018-04-16 DIAGNOSIS — K5792 Diverticulitis of intestine, part unspecified, without perforation or abscess without bleeding: Secondary | ICD-10-CM | POA: Insufficient documentation

## 2018-04-16 DIAGNOSIS — Z79899 Other long term (current) drug therapy: Secondary | ICD-10-CM | POA: Diagnosis not present

## 2018-04-16 DIAGNOSIS — R1012 Left upper quadrant pain: Secondary | ICD-10-CM

## 2018-04-16 DIAGNOSIS — N410 Acute prostatitis: Secondary | ICD-10-CM

## 2018-04-16 DIAGNOSIS — F17210 Nicotine dependence, cigarettes, uncomplicated: Secondary | ICD-10-CM | POA: Insufficient documentation

## 2018-04-16 DIAGNOSIS — R300 Dysuria: Secondary | ICD-10-CM

## 2018-04-16 DIAGNOSIS — N401 Enlarged prostate with lower urinary tract symptoms: Secondary | ICD-10-CM | POA: Diagnosis not present

## 2018-04-16 DIAGNOSIS — R109 Unspecified abdominal pain: Secondary | ICD-10-CM | POA: Diagnosis present

## 2018-04-16 LAB — COMPREHENSIVE METABOLIC PANEL
ALBUMIN: 3.8 g/dL (ref 3.5–5.0)
ALT: 18 U/L (ref 0–44)
ANION GAP: 12 (ref 5–15)
AST: 16 U/L (ref 15–41)
Alkaline Phosphatase: 67 U/L (ref 38–126)
BILIRUBIN TOTAL: 0.5 mg/dL (ref 0.3–1.2)
BUN: 11 mg/dL (ref 6–20)
CHLORIDE: 100 mmol/L (ref 98–111)
CO2: 26 mmol/L (ref 22–32)
Calcium: 9.4 mg/dL (ref 8.9–10.3)
Creatinine, Ser: 1.1 mg/dL (ref 0.61–1.24)
GFR calc Af Amer: >60 mL/min (ref >60)
GFR calc non Af Amer: >60 mL/min (ref >60)
GLUCOSE: 110 mg/dL — AB (ref 70–99)
POTASSIUM: 4.4 mmol/L (ref 3.5–5.1)
SODIUM: 138 mmol/L (ref 135–145)
TOTAL PROTEIN: 7.3 g/dL (ref 6.5–8.1)

## 2018-04-16 LAB — CBC
HCT: 47.2 % (ref 39.0–52.0)
HEMOGLOBIN: 15.4 g/dL (ref 13.0–17.0)
MCH: 28.4 pg (ref 26.0–34.0)
MCHC: 32.6 g/dL (ref 30.0–36.0)
MCV: 87.1 fL (ref 78.0–100.0)
Platelets: 288 10*3/uL (ref 150–400)
RBC: 5.42 MIL/uL (ref 4.22–5.81)
RDW: 13.6 % (ref 11.5–15.5)
WBC: 10.8 10*3/uL — ABNORMAL HIGH (ref 4.0–10.5)

## 2018-04-16 LAB — URINALYSIS, ROUTINE W REFLEX MICROSCOPIC
BILIRUBIN URINE: NEGATIVE
Glucose, UA: NEGATIVE mg/dL
Hgb urine dipstick: NEGATIVE
KETONES UR: 5 mg/dL — AB
Leukocytes, UA: NEGATIVE
Nitrite: NEGATIVE
Protein, ur: NEGATIVE mg/dL
Specific Gravity, Urine: 1.026 (ref 1.005–1.030)
pH: 5 (ref 5.0–8.0)

## 2018-04-16 LAB — LIPASE, BLOOD: Lipase: 30 U/L (ref 11–51)

## 2018-04-16 MED ORDER — TAMSULOSIN HCL 0.4 MG PO CAPS: 0.4000 mg | ORAL_CAPSULE | Freq: Every day | ORAL | 0 refills | Status: DC

## 2018-04-16 MED ORDER — IOHEXOL 300 MG/ML  SOLN
100.0000 mL | Freq: Once | INTRAMUSCULAR | Status: AC | PRN
  Administered 2018-04-16: 100 mL via INTRAVENOUS

## 2018-04-16 MED ORDER — AMOXICILLIN-POT CLAVULANATE 875-125 MG PO TABS
1.0000 | ORAL_TABLET | Freq: Two times a day (BID) | ORAL | 0 refills | Status: AC
Start: 2018-04-16 — End: 2018-04-26

## 2018-04-16 MED ORDER — AMOXICILLIN-POT CLAVULANATE 875-125 MG PO TABS: 1.0000 | ORAL_TABLET | Freq: Two times a day (BID) | ORAL | 0 refills | Status: DC

## 2018-04-16 NOTE — ED Provider Notes (Signed)
[facility] EMERGENCY DEPARTMENT Provider Note   CSN: [id number] Arrival date & time: 04/16/18  1127     History   Chief Complaint Chief Complaint  Patient presents with  . Flank Pain  . Groin Pain    HPI Ethan Williams is a 57 y.o. male who presents today with evaluation of left sided abdominal and flank pain.  He first noticed it on Saturday and was all over and now settled down into the left side.  Subjective fevers.  Worse with movement.  ASA and ibuprofen have not helped.  Nausea with out vomiting.  No diarrhea, intermittent bouts of constipation.  He reports morning dysuria.  He reports feeling like he needs to pee frequently but not a lot comes out.  He reports that he started taking flomax for his BPH.  But was under the impression that once he took it it would not need refills.    HPI  Past Medical History:  Diagnosis Date  . Anginal pain (HCC)   . Arthritis    "right shoulder" (06/06/2012)  . Daily headache    "here lately they've been daily" (06/06/2012)  . Dysrhythmia    "palpitations" (06/06/2012)  . External hemorrhoid, bleeding   . GERD (gastroesophageal reflux disease)   . Heart murmur    "as a child" (06/06/2012)  . Migraines    "treated for them in the last 6 months (06/06/2012)  . Pneumonia 2012; 06/06/2012  . Seasonal allergies    "outdoor; pollen, grass" (06/06/2012)  . Shortness of breath 06/06/2012   "at rest; lying down; w/exertion"    Patient Active Problem List   Diagnosis Date Noted  . Prostatitis, acute 06/29/2017  . Benign prostatic hyperplasia (BPH) with straining on urination 06/29/2017  . Urinary incontinence 11/08/2016  . Abnormal vision 03/16/2013  . Erectile dysfunction 01/06/2013  . Hemorrhoids 01/06/2013  . Tobacco dependence 06/05/2011  . Dental caries 05/22/2011  . Healthcare maintenance 04/20/2011  . Glenohumeral arthritis 02/22/2011  . Right shoulder pain 01/25/2011    Past Surgical History:  Procedure  Laterality Date  . APPENDECTOMY  2001  . SHOULDER SURGERY Right         Home Medications    Prior to Admission medications   Medication Sig Start Date End Date Taking? Authorizing Provider  aspirin 325 MG tablet Take 325 mg by mouth every 6 (six) hours as needed for mild pain.   Yes [provider]  Carboxymethylcellul-Glycerin (CLEAR EYES FOR DRY EYES) 1-0.25 % SOLN Apply 1 drop to eye daily as needed (dry eyes).   Yes [provider]  ibuprofen (ADVIL,MOTRIN) 200 MG tablet Take 200 mg by mouth every 6 (six) hours as needed for mild pain.   Yes [provider]  Multiple Vitamin (MULTIVITAMIN WITH MINERALS) TABS tablet Take 1 tablet by mouth daily.   Yes [provider]  ranitidine (ZANTAC) 75 MG tablet Take 75 mg by mouth daily.    Yes [provider]  amoxicillin-clavulanate (AUGMENTIN) 875-125 MG tablet Take 1 tablet by mouth every 12 (twelve) hours for 10 days. 04/16/18 04/26/18  Cristina Gong, PA-C  ibuprofen (ADVIL,MOTRIN) 600 MG tablet Take 1 tablet (600 mg total) by mouth once. Patient not taking: Reported on 04/16/2018 04/17/15   Earley Favor, NP  tamsulosin (FLOMAX) 0.4 MG CAPS capsule Take 1 capsule (0.4 mg total) by mouth daily. 04/16/18   Cristina Gong, PA-C    Family History Family History  Problem Relation Age of Onset  .  Cancer Father     Social History Social History   Tobacco Use  . Smoking status: Current Every Day Smoker    Packs/day: 1.00    Years: 40.00    Pack years: 40.00    Types: Cigarettes  . Smokeless tobacco: Never Used  Substance Use Topics  . Alcohol use: No    Comment: 06/06/2012 "History of abuse ; sober since 07/2009"  . 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   Review of  Systems Review of Systems  Constitutional: Positive for fever (Subjective). Negative for chills.  Respiratory: Positive for wheezing. Negative for chest tightness and shortness of breath.   Gastrointestinal: Positive for abdominal pain, constipation and nausea. Negative for abdominal distention, diarrhea and vomiting.  Genitourinary: Positive for dysuria and flank pain. Negative for decreased urine volume, frequency, penile pain and urgency.  Musculoskeletal: Positive for back pain.  All other systems reviewed and are negative.    Physical Exam Updated Vital Signs BP (!) 139/105   Pulse 80   Temp 98.8 F (37.1 C) (Oral)   Resp 18   SpO2 98%   Physical Exam  Constitutional: He is oriented to person, place, and time. He appears well-developed and well-nourished.  HENT:  Head: Normocephalic and atraumatic.  Mouth/Throat: Oropharynx is clear and moist.  Eyes: Conjunctivae are normal.  Neck: Normal range of motion. Neck supple.  Cardiovascular: Normal rate, regular rhythm, normal heart sounds and intact distal pulses. Exam reveals no friction rub.  No murmur heard. Pulmonary/Chest: Effort normal and breath sounds normal. No respiratory distress.  Abdominal: Soft. Bowel sounds are normal. He exhibits no distension and no mass. There is tenderness (Left flank) in the left upper quadrant and left lower quadrant. There is no guarding.  Musculoskeletal: He exhibits no edema or deformity.  Neurological: He is alert and oriented to person, place, and time.  Skin: Skin is warm and dry.  Psychiatric: He has a normal mood and affect. His behavior is normal.  Nursing note and vitals reviewed.    ED Treatments / Results  Labs (all labs ordered are listed, but only abnormal results are displayed) Labs Reviewed  COMPREHENSIVE METABOLIC PANEL - Abnormal; Notable for the following components:      Result Value   Glucose, Bld 110 (*)    All other components within normal limits  CBC -  Abnormal; Notable for the following components:   WBC 10.8 (*)    All other components within normal limits  URINALYSIS, ROUTINE W REFLEX MICROSCOPIC - Abnormal; Notable for the following components:   Ketones, ur 5 (*)    All other components within normal limits  LIPASE, BLOOD    EKG None  Radiology Ct Abdomen Pelvis W Contrast  Result Date: 04/16/2018 CLINICAL DATA:  Left abdominal pain EXAM: CT ABDOMEN AND PELVIS WITH CONTRAST TECHNIQUE: Multidetector CT imaging of the abdomen and pelvis was performed using the standard protocol following bolus administration of intravenous contrast. CONTRAST:  100mL OMNIPAQUE IOHEXOL 300 MG/ML  SOLN COMPARISON:  None. FINDINGS: Lower chest: Mild bibasilar atelectasis. Hepatobiliary: Normal gallbladder. Central benign-appearing cyst in the liver. Negative for biliary dilatation. Pancreas: Negative Spleen: Negative Adrenals/Urinary Tract: Normal kidneys ureter and bladder. No renal mass obstruction or stone. Stomach/Bowel: Pericolonic edema in the mid left  colon with associated diverticular change. Findings associated with acute diverticulitis. Negative for abscess. Postop appendectomy. Sigmoid diverticulosis without edema. Vascular/Lymphatic: Negative Reproductive: Mild prostate enlargement Other: None Musculoskeletal: Negative IMPRESSION: Acute diverticulitis mid left colon without abscess. Sigmoid diverticulosis Appendectomy. Electronically Signed   By: Marlan Palauharles  Clark M.D.   On: 04/16/2018 15:20    Procedures Procedures (including critical care time)  Medications Ordered in ED Medications  iohexol (OMNIPAQUE) 300 MG/ML solution 100 mL (100 mLs Intravenous Contrast Given 04/16/18 1502)     Initial Impression / Assessment and Plan / ED Course  I have reviewed the triage vital signs and the nursing notes.  Pertinent labs & imaging results that were available during my care of the patient were reviewed by me and considered in my medical decision making  (see chart for details).    Patient presents today for evaluation of left-sided abdominal pain.  He has had nausea without vomiting.  His abdomen was generally tender to palpation on the left side.  Labs were obtained and reviewed, white count very mildly elevated at 10.8.  CMP without significant electrolyte abnormalities.  Urine without significant abnormalities.  Lipase is not elevated.  Based on tenderness to palpation and left-sided abdomen concern for intra-abdominal process.    CT abdomen obtained showing diverticulitis of the mid left colon without abscess along with sigmoid diverticulosis.  No evidence of perforation.  Diagnosis was discussed with patient.  He will be treated with Augmentin and pain medicine.  flomax refilled.   Return precautions were discussed with patient who states their understanding.  At the time of discharge patient denied any unaddressed complaints or concerns.  Patient is agreeable for discharge home.   Final Clinical Impressions(s) / ED Diagnoses   Final diagnoses:  Diverticulitis  Benign prostatic hyperplasia (BPH) with straining on urination  Left upper quadrant pain    ED Discharge Orders         Ordered    amoxicillin-clavulanate (AUGMENTIN) 875-125 MG tablet  Every 12 hours,   Status:  Discontinued     04/16/18 1552    tamsulosin (FLOMAX) 0.4 MG CAPS capsule  Daily,   Status:  Discontinued     04/16/18 1552    amoxicillin-clavulanate (AUGMENTIN) 875-125 MG tablet  Every 12 hours     04/16/18 1554    tamsulosin (FLOMAX) 0.4 MG CAPS capsule  Daily     04/16/18 1554           Cristina GongHammond, Brainard Highfill W, [place] JerseyPA-C 04/16/18 1727    Tegeler, Canary Brimhristopher J, MD 04/16/18 2325

## 2018-04-16 NOTE — ED Triage Notes (Signed)
Pt states for the past 3 days he has had left flank pain into left groin.

## 2018-04-16 NOTE — Discharge Instructions (Addendum)
Please take Tylenol (acetaminophen) to relieve your pain.  You may take tylenol, up to 1,000 mg (two extra strength pills).  Do not take more than 3,000 mg tylenol in a 24 hour period.  Please check all medication labels as many medications such as pain and cold medications may contain tylenol. Please do not drink alcohol while taking this medication.   You may have diarrhea from the antibiotics.  It is very important that you continue to take the antibiotics even if you get diarrhea unless a medical professional tells you that you may stop taking them.  If you stop too early the bacteria you are being treated for will become stronger and you may need different, more powerful antibiotics that have more side effects and worsening diarrhea.  Please stay well hydrated and consider probiotics as they may decrease the severity of your diarrhea.    Please make sure you are getting plenty of fiber in your diet.

## 2018-05-14 ENCOUNTER — Other Ambulatory Visit: Payer: Self-pay

## 2018-05-14 ENCOUNTER — Emergency Department (HOSPITAL_COMMUNITY): Payer: Medicaid Other

## 2018-05-14 ENCOUNTER — Encounter (HOSPITAL_COMMUNITY): Payer: Self-pay | Admitting: Emergency Medicine

## 2018-05-14 ENCOUNTER — Emergency Department (HOSPITAL_COMMUNITY)
Admission: EM | Admit: 2018-05-14 | Discharge: 2018-05-15 | Disposition: A | Discharge status: Home / Self Care | Payer: Medicaid Other | Attending: Emergency Medicine | Admitting: Emergency Medicine

## 2018-05-14 DIAGNOSIS — R079 Chest pain, unspecified: Secondary | ICD-10-CM | POA: Diagnosis present

## 2018-05-14 DIAGNOSIS — Z7982 Long term (current) use of aspirin: Secondary | ICD-10-CM | POA: Diagnosis not present

## 2018-05-14 DIAGNOSIS — R0789 Other chest pain: Secondary | ICD-10-CM

## 2018-05-14 DIAGNOSIS — F17210 Nicotine dependence, cigarettes, uncomplicated: Secondary | ICD-10-CM | POA: Diagnosis not present

## 2018-05-14 LAB — BASIC METABOLIC PANEL
Anion gap: 9 (ref 5–15)
BUN: 12 mg/dL (ref 6–20)
CALCIUM: 9.3 mg/dL (ref 8.9–10.3)
CO2: 26 mmol/L (ref 22–32)
Chloride: 103 mmol/L (ref 98–111)
Creatinine, Ser: 1.18 mg/dL (ref 0.61–1.24)
Glucose, Bld: 108 mg/dL — ABNORMAL HIGH (ref 70–99)
POTASSIUM: 3.5 mmol/L (ref 3.5–5.1)
Sodium: 138 mmol/L (ref 135–145)

## 2018-05-14 LAB — CBC
HEMATOCRIT: 45.7 % (ref 39.0–52.0)
Hemoglobin: 15.2 g/dL (ref 13.0–17.0)
MCH: 28.6 pg (ref 26.0–34.0)
MCHC: 33.3 g/dL (ref 30.0–36.0)
MCV: 85.9 fL (ref 78.0–100.0)
Platelets: 245 10*3/uL (ref 150–400)
RBC: 5.32 MIL/uL (ref 4.22–5.81)
RDW: 13.8 % (ref 11.5–15.5)
WBC: 9 10*3/uL (ref 4.0–10.5)

## 2018-05-14 LAB — I-STAT TROPONIN, ED: TROPONIN I, POC: 0.02 ng/mL (ref 0.00–0.08)

## 2018-05-14 NOTE — ED Triage Notes (Signed)
Pt reports intermittent left sided cp with radiation to left arm and neck that has been going on for the last few days but has gotten worse today. Pain upon palpation. Pt reports that he doesn't think it is cardiac, believes it is a pinched nerve.

## 2018-05-14 NOTE — ED Provider Notes (Signed)
Patient placed in Quick Look pathway, seen and evaluated   Chief Complaint: chest pain  HPI:   Left sided chest pain radiating to the left arm and neck. Began a few days ago, intermittent but became constant this morning around 8am. Pain is severe. Associated with SOB, lightheadedness. Denies nausea, vomiting. Mild relief with muscle rub, no relief with advil. Current smoker of 1ppd. No recreational drug use.  States his pain is probably related to a pinched nerve.  ROS: Positive for chest pain, arm pain, shortness of breath, lightheadedness Negative for fever, nausea, vomiting  Physical Exam:   Gen: No distress  Neuro: Awake and Alert  Skin: Warm    Focused Exam: Heart regular rate and rhythm, no murmurs rubs or gallops.  Lungs clear to auscultation bilaterally.  Left upper anterior chest wall tender to palpation with no deformity, crepitus, ecchymosis, or flail segment.  Also has left paracervical muscle tenderness. 2+ radial and DP/PT pulses bilaterally, Homans sign absent bilaterally, no lower extremity edema, no palpable cords, compartments are soft    Initiation of care has begun. The patient has been counseled on the process, plan, and necessity for staying for the completion/evaluation, and the remainder of the medical screening examination    Bennye Alm 05/14/18 Melford Aase, MD 05/15/18 [phone]

## 2018-05-15 MED ORDER — PREDNISONE 10 MG PO TABS: 20.0000 mg | ORAL_TABLET | Freq: Two times a day (BID) | ORAL | 0 refills | Status: DC

## 2018-05-15 MED ORDER — TRAMADOL HCL 50 MG PO TABS
50.0000 mg | ORAL_TABLET | Freq: Once | ORAL | Status: AC
  Administered 2018-05-15: 50 mg via ORAL
  Filled 2018-05-15: qty 1

## 2018-05-15 MED ORDER — KETOROLAC TROMETHAMINE 60 MG/2ML IM SOLN
60.0000 mg | Freq: Once | INTRAMUSCULAR | Status: AC
  Administered 2018-05-15: 60 mg via INTRAMUSCULAR
  Filled 2018-05-15: qty 2

## 2018-05-15 MED ORDER — TRAMADOL HCL 50 MG PO TABS: 50.0000 mg | ORAL_TABLET | Freq: Four times a day (QID) | ORAL | 0 refills | Status: DC | PRN

## 2018-05-15 NOTE — ED Provider Notes (Signed)
[facility] EMERGENCY DEPARTMENT Provider Note   CSN: [id number] Arrival date & time: 05/14/18  1813     History   Chief Complaint Chief Complaint  Patient presents with  . Chest Pain    HPI Ethan Williams is a 57 y.o. male.  Patient is a 57 year old male with past medical history of GERD, migraines, arthritis of the right shoulder.  He presents today with complaints of chest pain.  This is been ongoing for the past 2 days.  He describes a severe, sharp pain to his left upper chest/left shoulder area that is worse when he moves and with range of motion of his shoulder.  He denies any shortness of breath, nausea, diaphoresis.  He denies any recent exertional symptoms.  He has no prior cardiac history.  Risk factors are hypertension and tobacco use.     Past Medical History:  Diagnosis Date  . Anginal pain (HCC)   . Arthritis    "right shoulder" (06/06/2012)  . Daily headache    "here lately they've been daily" (06/06/2012)  . Dysrhythmia    "palpitations" (06/06/2012)  . External hemorrhoid, bleeding   . GERD (gastroesophageal reflux disease)   . Heart murmur    "as a child" (06/06/2012)  . Migraines    "treated for them in the last 6 months (06/06/2012)  . Pneumonia 2012; 06/06/2012  . Seasonal allergies    "outdoor; pollen, grass" (06/06/2012)  . Shortness of breath 06/06/2012   "at rest; lying down; w/exertion"    Patient Active Problem List   Diagnosis Date Noted  . Prostatitis, acute 06/29/2017  . Benign prostatic hyperplasia (BPH) with straining on urination 06/29/2017  . Urinary incontinence 11/08/2016  . Abnormal vision 03/16/2013  . Erectile dysfunction 01/06/2013  . Hemorrhoids 01/06/2013  . Tobacco dependence 06/05/2011  . Dental caries 05/22/2011  . Healthcare maintenance 04/20/2011  . Glenohumeral arthritis 02/22/2011  . Right shoulder pain 01/25/2011    Past Surgical History:  Procedure Laterality Date  . APPENDECTOMY  2001  .  SHOULDER SURGERY Right         Home Medications    Prior to Admission medications   Medication Sig Start Date End Date Taking? Authorizing Provider  aspirin 325 MG tablet Take 325 mg by mouth every 6 (six) hours as needed for mild pain.    [provider]  Carboxymethylcellul-Glycerin (CLEAR EYES FOR DRY EYES) 1-0.25 % SOLN Apply 1 drop to eye daily as needed (dry eyes).    [provider]  ibuprofen (ADVIL,MOTRIN) 200 MG tablet Take 200 mg by mouth every 6 (six) hours as needed for mild pain.    [provider]  ibuprofen (ADVIL,MOTRIN) 600 MG tablet Take 1 tablet (600 mg total) by mouth once. Patient not taking: Reported on 04/16/2018 04/17/15   Earley Favor, NP  Multiple Vitamin (MULTIVITAMIN WITH MINERALS) TABS tablet Take 1 tablet by mouth daily.    [provider]  ranitidine (ZANTAC) 75 MG tablet Take 75 mg by mouth daily.     [provider]  tamsulosin (FLOMAX) 0.4 MG CAPS capsule Take 1 capsule (0.4 mg total) by mouth daily. 04/16/18   Cristina Gong, PA-C    Family History Family History  Problem Relation Age of Onset  . Cancer Father     Social History Social History   Tobacco Use  . Smoking status: Current Every Day Smoker    Packs/day: 1.00    Years: 40.00    Pack years: 40.00  Types: Cigarettes  . Smokeless tobacco: Never Used  Substance Use Topics  . Alcohol use: No    Comment: 06/06/2012 "History of abuse ; sober since 07/2009"  . 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   Review of Systems Review of Systems  All other systems reviewed and are negative.    Physical Exam Updated Vital Signs BP (!) 137/108 (BP Location: Left Arm)   Pulse 65   Temp 98.5 F (36.9 C) (Oral)   Resp 14   Ht 5\' 9"  (1.753  m)   Wt 86.2 kg   SpO2 96%   BMI 28.06 kg/m   Physical Exam  Constitutional: He is oriented to person, place, and time. He appears well-developed and well-nourished. No distress.  HENT:  Head: Normocephalic and atraumatic.  Mouth/Throat: Oropharynx is clear and moist.  Neck: Normal range of motion. Neck supple.  Cardiovascular: Normal rate and regular rhythm. Exam reveals no friction rub.  No murmur heard. Pulmonary/Chest: Effort normal and breath sounds normal. No respiratory distress. He has no wheezes. He has no rales.  There is tenderness to palpation to the left anterior upper chest and shoulder.  This reproduces his symptoms.  Abdominal: Soft. Bowel sounds are normal. He exhibits no distension. There is no tenderness.  Musculoskeletal: Normal range of motion. He exhibits no edema.  Neurological: He is alert and oriented to person, place, and time. Coordination normal.  Skin: Skin is warm and dry. He is not diaphoretic.  Nursing note and vitals reviewed.    ED Treatments / Results  Labs (all labs ordered are listed, but only abnormal results are displayed) Labs Reviewed  BASIC METABOLIC PANEL - Abnormal; Notable for the following components:      Result Value   Glucose, Bld 108 (*)    All other components within normal limits  CBC  I-STAT TROPONIN, ED    EKG EKG Interpretation  Date/Time:  Tuesday May 14 2018 18:27:05 EDT Ventricular Rate:  76 PR Interval:  186 QRS Duration: 96 QT Interval:  376 QTC Calculation: 423 R Axis:   76 Text Interpretation:  Normal sinus rhythm Possible Left atrial enlargement Borderline ECG Confirmed by Geoffery Lyons ([id number]) on 05/15/2018 12:40:29 AM   Radiology Dg Chest 2 View  Result Date: 05/14/2018 CLINICAL DATA:  Shortness of breath and chest pain EXAM: CHEST - 2 VIEW COMPARISON:  June 06, 2012 FINDINGS: There is no evident edema or consolidation. There is slight scarring in the left base. Heart size and pulmonary  vascularity are normal. No adenopathy. 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 IMPRESSION: Slight scarring left base. No edema or consolidation. No adenopathy. Electronically Signed   By: Bretta Bang III M.D.   On: 05/14/2018 19:29    Procedures Procedures (including critical care time)  Medications Ordered in ED Medications  ketorolac (TORADOL) injection 60 mg (has no administration in time range)  traMADol (ULTRAM) tablet 50 mg (has no administration in time range)     Initial Impression / Assessment and Plan / ED Course  I have reviewed the triage vital signs and the nursing notes.  Pertinent labs & imaging results that were available during my care of the patient were reviewed by me and considered in my medical decision making (see  chart for details).  Patient presenting with chest pain that is highly suspicious for a musculoskeletal etiology.  It is worse when he moves and reproducible with palpation.  His work-up is unremarkable including troponin, EKG, and chest x-ray.  At this point I do not feel as though any further cardiac work-up is necessary.  He will be discharged with prednisone, tramadol, and is to follow-up with his primary doctor as scheduled this coming Tuesday.  Final Clinical Impressions(s) / ED Diagnoses   Final diagnoses:  None    ED Discharge Orders    None       Geoffery Lyons, MD 05/15/18 [phone]

## 2018-05-15 NOTE — ED Notes (Signed)
ED Provider at bedside. 

## 2018-05-15 NOTE — Discharge Instructions (Addendum)
Prednisone as prescribed.  Tramadol as prescribed as needed for pain.  Follow-up with your doctor in the next week for a recheck, and return to the emergency department if your symptoms worsen or change in the meantime.

## 2018-05-21 ENCOUNTER — Other Ambulatory Visit: Payer: Self-pay

## 2018-05-21 ENCOUNTER — Ambulatory Visit (HOSPITAL_COMMUNITY)
Admission: RE | Admit: 2018-05-21 | Discharge: 2018-05-21 | Disposition: A | Discharge status: Home / Self Care | Payer: Medicaid Other | Source: Ambulatory Visit | Attending: Family Medicine | Admitting: Family Medicine

## 2018-05-21 ENCOUNTER — Encounter: Payer: Self-pay | Admitting: Family Medicine

## 2018-05-21 ENCOUNTER — Ambulatory Visit: Payer: Medicaid Other | Admitting: Family Medicine

## 2018-05-21 VITALS — BP 104/62 | HR 70 | Temp 98.4°F | Ht 71.0 in | Wt 194.8 lb

## 2018-05-21 DIAGNOSIS — M542 Cervicalgia: Secondary | ICD-10-CM

## 2018-05-21 DIAGNOSIS — M25512 Pain in left shoulder: Secondary | ICD-10-CM | POA: Insufficient documentation

## 2018-05-21 DIAGNOSIS — M5010 Cervical disc disorder with radiculopathy, unspecified cervical region: Secondary | ICD-10-CM | POA: Diagnosis not present

## 2018-05-21 DIAGNOSIS — G8929 Other chronic pain: Secondary | ICD-10-CM

## 2018-05-21 DIAGNOSIS — M5032 Other cervical disc degeneration, mid-cervical region, unspecified level: Secondary | ICD-10-CM | POA: Insufficient documentation

## 2018-05-21 MED ORDER — BACLOFEN 10 MG PO TABS: 10.0000 mg | ORAL_TABLET | Freq: Two times a day (BID) | ORAL | 0 refills | Status: DC | PRN

## 2018-05-21 NOTE — Patient Instructions (Signed)
It was good to see you today.  Go to the hospital for an xray of your shoulder and neck.  You do not need an appointment.  I will call you with the results and we'll form a plan at that point.  Take the Baclofen as a muscle relaxer.  Continue to do massage and heat on your back and neck as you have been.

## 2018-05-21 NOTE — Progress Notes (Signed)
Subjective:    Ethan Williams is a 57 y.o. male who [facility] Left shoulder and neck pain:  1.  Neck pain:  Seen in ED a few days ago for left-sided sharp chest pain and after work-up was told he had a pinched nerve most likely in his neck that was causing the pain.  He no longer has any chest pain.  He is prescribed tramadol and prednisone at that visit.  Since then he is continued to have pain on left side of his neck as well as left shoulder pain which is chronic, see below.  He also has thoracic pain on the left side underneath the shoulder blade.  He has numbness and tingling that radiates down his left arm to his first 3 fingers.  His strength left hand is a little weaker but otherwise at baseline.  He has not noted much improvement with tramadol and prednisone.  No lower extremity weakness or numbness or weakness.  No bladder bowel incontinence.  No trouble on his right side.  He is right-handed.  2.  Left shoulder pain: Chronic for the past 6 months.  Worse in the past 2 weeks.  He wonders if this is related to his neck pain.  No injuries for this or his neck.  He had a history of bone spurs with surgical repair in his right shoulder several years ago which resulted in much relief.  Shoulder is painful when he attempts to lift above his head or do much work with his left arm.  He is not taking anything for relief for this.  No redness or swelling  ROS as above per HPI.    The following portions of the patient's history were reviewed and updated as appropriate: allergies, current medications, past medical history, family and social history, and problem list. Patient is a nonsmoker.    PMH reviewed.  Past Medical History:  Diagnosis Date  . Anginal pain (HCC)   . Arthritis    "right shoulder" (06/06/2012)  . Daily headache    "here lately they've been daily" (06/06/2012)  . Dysrhythmia    "palpitations" (06/06/2012)  . External hemorrhoid, bleeding   . GERD  (gastroesophageal reflux disease)   . Heart murmur    "as a child" (06/06/2012)  . Migraines    "treated for them in the last 6 months (06/06/2012)  . Pneumonia 2012; 06/06/2012  . Seasonal allergies    "outdoor; pollen, grass" (06/06/2012)  . Shortness of breath 06/06/2012   "at rest; lying down; w/exertion"   Past Surgical History:  Procedure Laterality Date  . APPENDECTOMY  2001  . SHOULDER SURGERY Right     Medications reviewed. Current Outpatient Medications  Medication Sig Dispense Refill  . aspirin 325 MG tablet Take 325 mg by mouth every 6 (six) hours as needed for mild pain.    . Carboxymethylcellul-Glycerin (CLEAR EYES FOR DRY EYES) 1-0.25 % SOLN Apply 1 drop to eye daily as needed (dry eyes).    Marland Kitchen. ibuprofen (ADVIL,MOTRIN) 200 MG tablet Take 200 mg by mouth every 6 (six) hours as needed for mild pain.    Marland Kitchen. ibuprofen (ADVIL,MOTRIN) 600 MG tablet Take 1 tablet (600 mg total) by mouth once. (Patient not taking: Reported on 04/16/2018) 30 tablet 0  . Multiple Vitamin (MULTIVITAMIN WITH MINERALS) TABS tablet Take 1 tablet by mouth daily.    . predniSONE (DELTASONE) 10 MG tablet Take 2 tablets (20 mg total) by mouth 2 (two) times daily with a  meal. 20 tablet 0  . ranitidine (ZANTAC) 75 MG tablet Take 75 mg by mouth daily.     . tamsulosin (FLOMAX) 0.4 MG CAPS capsule Take 1 capsule (0.4 mg total) by mouth daily. 30 capsule 0  . traMADol (ULTRAM) 50 MG tablet Take 1 tablet (50 mg total) by mouth every 6 (six) hours as needed. 15 tablet 0   No current facility-administered medications for this visit.      Objective:   Physical Exam BP 104/62   Pulse 70   Temp 98.4 F (36.9 C) (Oral)   Ht 5\' 11"  (1.803 m)   Wt 194 lb 12.8 oz (88.4 kg)   SpO2 95%   BMI 27.17 kg/m  Gen:  Alert, cooperative patient who appears stated age in no acute distress.  Vital signs reviewed. HEENT: EOMI,  MMM Neck:  TTP along left cervical spinal muscles.  Also palpable spasm noted here..  Nontender  along vertebra/spine itself.  He has difficulty moving his head laterally to the left side to about 45 degrees.  Normal movement to the right side.  He also has difficulty attempting to put his left ear onto his left shoulder due to stiffness.  This re-creates the tingling sensation and numbness in his arm.  Cardiac:  Regular rate and rhythm without murmur auscultated.  Good S1/S2. Pulm:  Clear to auscultation bilaterally with good air movement.  No wheezes or rales noted.   MSK:  His upper extremity strength is 5 out of 5 bilaterally.  Good abduction.  Good abduction.  Good internal and external rotation though this does cause level of crepitus in his left shoulder.  Handgrip strength is 5 out of 5 bilaterally though it does feel very minimally reduced in left hand.  He has numbness to his first 3 digits on the left hand.  None on the right.  No weakness or sensory deficits bilateral lower extremities.   Impression/plan: 1.  Cervical neck muscle spasm: -Most likely diagnosis for left-sided paresthesias and pain. -We are obtaining an x-ray to check for source of his radiculopathy. -I have added baclofen to his medication regimen to help with the spasm. -No trigger injury. -He emphasized.  I will call with results of x-ray.  2.  Left shoulder pain: -Chronic for him. -I do not think this is contributing to his acute issue as above. -We will obtain x-rays of his shoulder. -May need follow-up with orthopedics depending on results.  He has already had shoulder spur removal/arthrocentesis of his right shoulder.

## 2018-05-23 ENCOUNTER — Telehealth: Payer: Self-pay | Admitting: Family Medicine

## 2018-05-23 NOTE — Telephone Encounter (Signed)
Called and had to leave voicemail.  Let patient know that his x-rays showed some mild arthritis disease.  He should continue with his muscle relaxer and as needed anti-inflammatories.  Follow-up in about 2 weeks to assess for improvement.

## 2018-07-23 ENCOUNTER — Ambulatory Visit: Payer: Medicaid Other | Admitting: Family Medicine

## 2018-07-23 ENCOUNTER — Other Ambulatory Visit: Payer: Self-pay

## 2018-07-23 ENCOUNTER — Encounter: Payer: Self-pay | Admitting: Family Medicine

## 2018-07-23 VITALS — BP 120/82 | HR 87 | Temp 98.2°F | Wt 200.0 lb

## 2018-07-23 DIAGNOSIS — Z1211 Encounter for screening for malignant neoplasm of colon: Secondary | ICD-10-CM | POA: Diagnosis not present

## 2018-07-23 DIAGNOSIS — M25512 Pain in left shoulder: Secondary | ICD-10-CM | POA: Diagnosis not present

## 2018-07-23 DIAGNOSIS — G8929 Other chronic pain: Secondary | ICD-10-CM | POA: Diagnosis not present

## 2018-07-23 DIAGNOSIS — F17200 Nicotine dependence, unspecified, uncomplicated: Secondary | ICD-10-CM | POA: Diagnosis not present

## 2018-07-23 DIAGNOSIS — Z23 Encounter for immunization: Secondary | ICD-10-CM | POA: Diagnosis not present

## 2018-07-23 MED ORDER — FAMOTIDINE 20 MG PO TABS: 20.0000 mg | ORAL_TABLET | Freq: Two times a day (BID) | ORAL | 1 refills | Status: DC

## 2018-07-23 NOTE — Progress Notes (Signed)
   CC: shoulder pain  HPI  Shoulder pain - around L shoulder. Feels like muscles, spasms. Settled on a [facility] after [facility] visit. End of his fingers are numb x3 months. Chiropractor didn't help his L arm pain. Hx of R [facility]. Does feel some pinching in his L shoulder. Sometimes hurts more when he lies on his side. Recently had XRs.   disability 2/2 metal health dx.   HM- wants flu shot. Wants colonoscopy.   Continued smoking cigarettes.   ROS: Denies CP, SOB, abdominal pain, dysuria, changes in BMs.   CC, SH/smoking status, and VS noted  Objective: BP 120/82   Pulse 87   Temp 98.2 F (36.8 C) (Oral)   Wt 200 lb (90.7 kg)   SpO2 97%   BMI 27.89 kg/m  Gen: NAD, alert, cooperative, and pleasant. HEENT: NCAT, EOMI, PERRL CV: RRR, no murmur Resp: CTAB, no wheezes, non-labored Abd: SNTND, BS present, no guarding or organomegaly Ext: No edema, warm. L shoulder with +empty can reproducing some of his pain.  Neuro: Alert and oriented, Speech clear, No gross deficits  Assessment and plan:  Chronic left shoulder pain Question bursitis given worse after sleep vs glenohumeral OA. Patient would like to see a specialist. We will do Sports med referral.   Tobacco dependence quitline info discussed.    Orders Placed This Encounter  Procedures  . Flu Vaccine QUAD 36+ mos IM  . Ambulatory referral to Sports Medicine    Referral Priority:   Routine    Referral Type:   Consultation    Number of Visits Requested:   1  . Ambulatory referral to Gastroenterology    Referral Priority:   Routine    Referral Type:   Consultation    Referral Reason:   Specialty Services Required    Number of Visits Requested:   1    Meds ordered this encounter  Medications  . famotidine (PEPCID) 20 MG tablet    Sig: Take 1 tablet (20 mg total) by mouth 2 (two) times daily.    Dispense:  180 tablet    Refill:  1   HM - referred for colonoscopy, flu shot  given.  Loni MuseKate Shakeerah Gradel, MD, PGY3 07/24/2018 5:58 PM

## 2018-07-23 NOTE — Patient Instructions (Signed)
It was a pleasure to see you today! Thank you for choosing [facility] for your primary care. Ethan Williams was seen for shoulder pain.   Our plans for today were:  I sent the referral to the sports medicine center, they will call you.   I sent a referral for your colonoscopy.   Call 1800-QUIT-NOW for help with stopping smoking. They can assist with free resources such as patches, check-in calls, and counseling.   I sent the acid medicine.   Best,  Dr. Chanetta Marshallimberlake

## 2018-07-24 DIAGNOSIS — M25512 Pain in left shoulder: Principal | ICD-10-CM

## 2018-07-24 DIAGNOSIS — G8929 Other chronic pain: Secondary | ICD-10-CM | POA: Insufficient documentation

## 2018-07-24 NOTE — Assessment & Plan Note (Signed)
quitline info discussed.

## 2018-07-24 NOTE — Assessment & Plan Note (Signed)
Question bursitis given worse after sleep vs glenohumeral OA. Patient would like to see a specialist. We will do Sports med referral.

## 2018-07-30 ENCOUNTER — Encounter: Payer: Self-pay | Admitting: Sports Medicine

## 2018-07-30 ENCOUNTER — Ambulatory Visit: Payer: Medicaid Other | Admitting: Sports Medicine

## 2018-07-30 VITALS — BP 118/88 | Ht 71.0 in | Wt 195.0 lb

## 2018-07-30 DIAGNOSIS — M25512 Pain in left shoulder: Secondary | ICD-10-CM | POA: Diagnosis present

## 2018-07-30 DIAGNOSIS — R29898 Other symptoms and signs involving the musculoskeletal system: Secondary | ICD-10-CM | POA: Diagnosis not present

## 2018-07-30 DIAGNOSIS — G8929 Other chronic pain: Secondary | ICD-10-CM

## 2018-07-30 MED ORDER — GABAPENTIN 300 MG PO CAPS: 300.0000 mg | ORAL_CAPSULE | Freq: Every day | ORAL | 1 refills | Status: DC

## 2018-07-30 NOTE — Progress Notes (Signed)
   Subjective:    Patient ID: Ethan Williams, male    DOB: 12-Aug-1961, 57 y.o.   MRN: [id number]  HPI Laban EmperorDarrell Obie DredgeLangley is a 57 year old male who presents for evaluation of left arm numbness. The numbness started 3 months ago following a day of washing/waxing his care. He had no acute trauma or injury that preceded the numbness. The numbness starts in his armpit and travels down the medial aspect of his arm into his hand. His entire hand is affected but notes the numbness mostly affects his 2nd and 3rd digits. Patient does have some neck pain but this has resolved over the last several weeks. He also endorses left shoulder pain, but this is not as concerning to him as the numbness in his arm and hand. He denies weakness or limitations to range of motion at the shoulder. He also endorses weakness affecting his left hand and left arm. Patient is right handed. He has a history of shoulder replacement on the right. He initially saw his PCP for this problem who ordered xrays of the left shoulder and cervical spine. Xrays of the shoulder did not show any degenerative changes. Xrays of the c-spine did show degenerative changes affecting C5-T1. Patient is not currently taking any medications for the numbness and has not done any physical therapy. Patient is a Psychologist, clinicalguitar player and the new numbness/weakness in his arm has greatly affected his ability to play.   Review of Systems See HPI    Objective:   Physical Exam General: No acute distress, alert, oriented Resp: Breathing comfortably on room air Circ: Extremities warm and well perfused. 2+ radial pulses MSK:  Neck -Inspection: No deformity. No bruising -Palpation: no tenderness over bony prominences -Range of motion: Normal with flexion, extension, lateral rotation bilaterally, lateral bending bilaterally -Spurlings: Negative bilaterally  Left shoulder: -Inspection: No scapular winging -Palpation: Minimal tenderness over lateral shoulder and biceps  tendon. Remainder of shoulder non-tender to palpation -ROM: Normal range of motion with flexion, extension, internal rotation, external rotation, abduction -Strength: 5/5 strength with  flexion, extension, internal rotation, external rotation, abduction -Hawkins: positive -Neers: Negative -Empty can: Negative -O'briens: Negative -Cross arm: Negative -Speeds: Negative -Yergusons: Negative  Left Arm -Inspection: No deformity, no bruising -Palpation: No tenderness to palpation -ROM: Normal ROM at elbow with flexion, extension, normal ROM at wrist with flexion, extension -Strength: 4/5 strength with elbow extension. 5/5 strength with elbow flexion. 5/5 strength with wrist flexion and extension -Tinels: Negative at elbow and at wrist -Phalyns: Negative -Sensation: Decreased sensation to light touch along inner arm in C8 distribution -Reflexes: 1+ tricep reflex on left, 2+ on right. 2+ biceps and brachialis reflex bilaterally       Assessment & Plan:  Ethan BracketDarrell Calligan is a [age] year old male who presents for evaluation of left arm numbness  1. Left arm numbness -Given history and exam, source of numbness is likely coming from nerve compression at the neck as opposed to intrinsic shoulder pathology -Xrays reviewed. Signs of cervical degeneration between C5-T1 seen -Will obtain MRI of cervical spine -Patient started on gabapentin 300mg  qhs  Will have patient follow up after MRI.  Consider neurosurgical referral based on those findings.

## 2018-08-19 ENCOUNTER — Ambulatory Visit
Admission: RE | Admit: 2018-08-19 | Discharge: 2018-08-19 | Disposition: A | Discharge status: Home / Self Care | Payer: Medicaid Other | Source: Ambulatory Visit | Attending: Sports Medicine | Admitting: Sports Medicine

## 2018-08-19 DIAGNOSIS — G8929 Other chronic pain: Secondary | ICD-10-CM

## 2018-08-19 DIAGNOSIS — M25512 Pain in left shoulder: Principal | ICD-10-CM

## 2018-08-23 ENCOUNTER — Telehealth: Payer: Self-pay | Admitting: Sports Medicine

## 2018-08-23 NOTE — Telephone Encounter (Signed)
  I have made multiple attempts to contact this patient via telephone to discuss MRI findings of his cervical spine.  MRI shows a moderately large C6-C7 disc extrusion which results in moderate spinal stenosis.  He also has a C5-C6 degenerated disc with an associated left sided disc protrusion which results in severe left neuroforaminal stenosis.  Based on these findings in addition to the weakness he exhibited on physical exam, I would like to refer this patient to Dr. Yevette Edwardsumonski to discuss merits of surgical intervention.  We will continue to try to reach this patient via telephone.

## 2018-08-26 ENCOUNTER — Encounter: Payer: Self-pay | Admitting: *Deleted

## 2018-08-29 ENCOUNTER — Telehealth: Payer: Self-pay | Admitting: *Deleted

## 2018-08-29 NOTE — Telephone Encounter (Signed)
Voicemail left.

## 2018-09-02 ENCOUNTER — Encounter: Payer: Self-pay | Admitting: *Deleted

## 2018-09-02 NOTE — Patient Instructions (Signed)
[facility] Friday [facility] 3[facility] 2020 at [facility]3pm [address], [place], [place] [address] Phone: [phone]

## 2018-10-17 ENCOUNTER — Other Ambulatory Visit: Payer: Self-pay | Admitting: Orthopedic Surgery

## 2018-10-19 ENCOUNTER — Other Ambulatory Visit: Payer: Self-pay | Admitting: Sports Medicine

## 2018-10-21 ENCOUNTER — Encounter (HOSPITAL_COMMUNITY)
Admission: RE | Admit: 2018-10-21 | Discharge: 2018-10-21 | Disposition: A | Discharge status: Home / Self Care | Payer: Medicaid Other | Source: Ambulatory Visit | Attending: Orthopedic Surgery | Admitting: Orthopedic Surgery

## 2018-10-21 ENCOUNTER — Other Ambulatory Visit: Payer: Self-pay

## 2018-10-21 ENCOUNTER — Encounter (HOSPITAL_COMMUNITY): Payer: Self-pay

## 2018-10-21 DIAGNOSIS — Z01812 Encounter for preprocedural laboratory examination: Secondary | ICD-10-CM | POA: Insufficient documentation

## 2018-10-21 LAB — CBC WITH DIFFERENTIAL/PLATELET
Abs Immature Granulocytes: 0.03 10*3/uL (ref 0.00–0.07)
Basophils Absolute: 0.1 10*3/uL (ref 0.0–0.1)
Basophils Relative: 1 %
EOS ABS: 0.2 10*3/uL (ref 0.0–0.5)
EOS PCT: 2 %
HCT: 49.7 % (ref 39.0–52.0)
Hemoglobin: 15.9 g/dL (ref 13.0–17.0)
Immature Granulocytes: 0 %
Lymphocytes Relative: 35 %
Lymphs Abs: 2.8 10*3/uL (ref 0.7–4.0)
MCH: 28.2 pg (ref 26.0–34.0)
MCHC: 32 g/dL (ref 30.0–36.0)
MCV: 88.3 fL (ref 80.0–100.0)
Monocytes Absolute: 0.8 10*3/uL (ref 0.1–1.0)
Monocytes Relative: 9 %
Neutro Abs: 4.3 10*3/uL (ref 1.7–7.7)
Neutrophils Relative %: 53 %
Platelets: 255 10*3/uL (ref 150–400)
RBC: 5.63 MIL/uL (ref 4.22–5.81)
RDW: 13.4 % (ref 11.5–15.5)
WBC: 8.1 10*3/uL (ref 4.0–10.5)
nRBC: 0 % (ref 0.0–0.2)

## 2018-10-21 LAB — COMPREHENSIVE METABOLIC PANEL
ALT: 22 U/L (ref 0–44)
ANION GAP: 10 (ref 5–15)
AST: 18 U/L (ref 15–41)
Albumin: 4.1 g/dL (ref 3.5–5.0)
Alkaline Phosphatase: 54 U/L (ref 38–126)
BUN: 11 mg/dL (ref 6–20)
CO2: 24 mmol/L (ref 22–32)
Calcium: 9 mg/dL (ref 8.9–10.3)
Chloride: 104 mmol/L (ref 98–111)
Creatinine, Ser: 1.1 mg/dL (ref 0.61–1.24)
GFR calc Af Amer: >60 mL/min (ref >60)
GFR calc non Af Amer: >60 mL/min (ref >60)
Glucose, Bld: 104 mg/dL — ABNORMAL HIGH (ref 70–99)
Potassium: 4.7 mmol/L (ref 3.5–5.1)
Sodium: 138 mmol/L (ref 135–145)
Total Bilirubin: 0.7 mg/dL (ref 0.3–1.2)
Total Protein: 7.4 g/dL (ref 6.5–8.1)

## 2018-10-21 LAB — PROTIME-INR
INR: 0.92
PROTHROMBIN TIME: 12.3 s (ref 11.4–15.2)

## 2018-10-21 LAB — SURGICAL PCR SCREEN
MRSA, PCR: NEGATIVE
STAPHYLOCOCCUS AUREUS: POSITIVE — AB

## 2018-10-21 LAB — APTT: aPTT: 38 seconds — ABNORMAL HIGH (ref 24–36)

## 2018-10-21 LAB — URINALYSIS, ROUTINE W REFLEX MICROSCOPIC
Bilirubin Urine: NEGATIVE
Glucose, UA: NEGATIVE mg/dL
Hgb urine dipstick: NEGATIVE
Ketones, ur: NEGATIVE mg/dL
Leukocytes,Ua: NEGATIVE
Nitrite: NEGATIVE
PH: 5 (ref 5.0–8.0)
Protein, ur: NEGATIVE mg/dL
Specific Gravity, Urine: 1.026 (ref 1.005–1.030)

## 2018-10-21 LAB — TYPE AND SCREEN
ABO/RH(D): O NEG
Antibody Screen: NEGATIVE

## 2018-10-21 LAB — ABO/RH: ABO/RH(D): O NEG

## 2018-10-21 NOTE — Progress Notes (Addendum)
PCP - Garth Bigness Cardiologist - patient denies  Chest x-ray - 05/14/2018 EKG - 05/15/2018; borderline Stress Test - patient states he has one 15+ years ago for CP, but it was negative with no follow up and CP was due to stress ECHO - patient denies Cardiac Cath - patient denies  Sleep Study - patient denies CPAP -   Fasting Blood Sugar - n/a Checks Blood Sugar _____ times a day  Blood Thinner Instructions:n/a Aspirin Instructions: pateint stopped ASA 7-10 days ago.  Anesthesia review: yes, hx of angina and palpitations; borderline EKG  Patient denies shortness of breath, fever, cough and chest pain at PAT appointment   Patient verbalized understanding of instructions that were given to them at the PAT appointment. Patient was also instructed that they will need to review over the PAT instructions again at home before surgery.

## 2018-10-21 NOTE — Pre-Procedure Instructions (Signed)
Ethan Williams  10/21/2018      [facility] - [place] - [address] [place] [address] [address] [address] [place] [address] Phone: [phone] Fax: [phone]  [id number] - [place], [place] - [address] [place] [address] Phone: [phone] Fax: [phone]    Your procedure is scheduled on 10/23/2018.  Report to [facility] Entr[facility] "A" Admitting office at 0630 A.M.  Call this number if you have problems the morning of surgery:  [phone]     Call [phone] if you have any questions prior to your surgery date  Monday-Friday 8am-4pm    Remember:  Do not eat or drink after midnight the night before your surgery.     Take these medicines the morning of surgery with A SIP OF WATER: Acetaminophen (Tylenol) - if needed Clear Eyes eye drops - if needed Famotidine (Pepcid)  7 days prior to surgery STOP taking any Aspirin (unless otherwise instructed by your surgeon), Aleve, Naproxen, Ibuprofen, Motrin, Advil, Goody's, BC's, all herbal medications, fish oil, and all vitamins.     Do not wear jewelry, make-up or nail polish.  Do not wear lotions, powders, or colognes, or deodorant.  Men may shave face and neck.  Do not bring valuables to the hospital.  [facility] is not responsible for any belongings or valuables.  Contacts, eyglasses, hearing aids, dentures or bridgework may not be worn into surgery.  Leave your suitcase in the car.  After surgery it may be brought to your room.  For patients admitted to the hospital, discharge time will be determined by your treatment team.  Patients discharged the day of surgery will not be allowed to drive home.   Name and phone number of your driver:    Special instructions:   [facility]- Preparing For Surgery  Before surgery, you can play an important role. Because skin is not sterile, your skin needs to be as free of germs as possible. You can reduce  the number of germs on your skin by washing with CHG (chlorahexidine gluconate) Soap before surgery.  CHG is an antiseptic cleaner which kills germs and bonds with the skin to continue killing germs even after washing.    Oral Hygiene is also important to reduce your risk of infection.  Remember - BRUSH YOUR TEETH THE MORNING OF SURGERY WITH YOUR REGULAR TOOTHPASTE  Please do not use if you have an allergy to CHG or antibacterial soaps. If your skin becomes reddened/irritated stop using the CHG.  Do not shave (including legs and underarms) for at least 48 hours prior to first CHG shower. It is OK to shave your face.  Please follow these instructions carefully.   1. Shower the NIGHT BEFORE SURGERY and the MORNING OF SURGERY with CHG.   2. If you chose to wash your hair, wash your hair first as usual with your normal shampoo.  3. After you shampoo, rinse your hair and body thoroughly to remove the shampoo.  4. Use CHG as you would any other liquid soap. You can apply CHG directly to the skin and wash gently with a scrungie or a clean washcloth.   5. Apply the CHG Soap to your body ONLY FROM THE NECK DOWN.  Do not use on open wounds or open sores. Avoid contact with your eyes, ears, mouth and genitals (private parts). Wash Face and genitals (private parts)  with your normal soap.  6. Wash thoroughly, paying special attention  to the area where your surgery will be performed.  7. Thoroughly rinse your body with warm water from the neck down.  8. DO NOT shower/wash with your normal soap after using and rinsing off the CHG Soap.  9. Pat yourself dry with a CLEAN TOWEL.  10. Wear CLEAN PAJAMAS to bed the night before surgery, wear comfortable clothes the morning of surgery  11. Place CLEAN SHEETS on your bed the night of your first shower and DO NOT SLEEP WITH PETS.    Day of Surgery: Shower as stated above. Do not apply any deodorants/lotions.  Please wear clean clothes to the  hospital/surgery center.   Remember to brush your teeth WITH YOUR REGULAR TOOTHPASTE.    Please read over the following fact sheets that you were given. Pain Booklet, Coughing and Deep Breathing and Surgical Site Infection Prevention

## 2018-10-22 NOTE — Anesthesia Preprocedure Evaluation (Addendum)
Anesthesia Evaluation  Patient identified by MRN, date of birth, ID band Patient awake    Reviewed: Allergy & Precautions, NPO status , Patient's Chart, lab work & pertinent test results  Airway Mallampati: III  TM Distance: >3 FB Neck ROM: Limited  Mouth opening: Limited Mouth Opening  Dental no notable dental hx. (+) Chipped, Dental Advisory Given,    Pulmonary shortness of breath, pneumonia, resolved, Current Smoker,    Pulmonary exam normal breath sounds clear to auscultation       Cardiovascular negative cardio ROS Normal cardiovascular exam Rhythm:Regular Rate:Normal     Neuro/Psych negative neurological ROS  negative psych ROS   GI/Hepatic Neg liver ROS, GERD  Medicated and Controlled,  Endo/Other  negative endocrine ROS  Renal/GU negative Renal ROS  negative genitourinary   Musculoskeletal  (+) Arthritis ,   Abdominal   Peds  Hematology negative hematology ROS (+)   Anesthesia Other Findings   Reproductive/Obstetrics                           Anesthesia Physical Anesthesia Plan  ASA: II  Anesthesia Plan: General   Post-op Pain Management:    Induction: Intravenous  PONV Risk Score and Plan: 1 and Ondansetron, Dexamethasone and Midazolam  Airway Management Planned: Oral ETT and Video Laryngoscope Planned  Additional Equipment:   Intra-op Plan:   Post-operative Plan: Extubation in OR  Informed Consent: I have reviewed the patients History and Physical, chart, labs and discussed the procedure including the risks, benefits and alternatives for the proposed anesthesia with the patient or authorized representative who has indicated his/her understanding and acceptance.     Dental advisory given  Plan Discussed with: CRNA  Anesthesia Plan Comments:       Anesthesia Quick Evaluation

## 2018-10-23 ENCOUNTER — Inpatient Hospital Stay (HOSPITAL_COMMUNITY): Payer: Medicaid Other | Admitting: Certified Registered Nurse Anesthetist

## 2018-10-23 ENCOUNTER — Inpatient Hospital Stay (HOSPITAL_COMMUNITY): Payer: Medicaid Other

## 2018-10-23 ENCOUNTER — Inpatient Hospital Stay (HOSPITAL_COMMUNITY)
Admission: RE | Disposition: A | Discharge status: Home / Self Care | Payer: Self-pay | Source: Home / Self Care | Attending: Orthopedic Surgery

## 2018-10-23 ENCOUNTER — Inpatient Hospital Stay (HOSPITAL_COMMUNITY)
Admission: RE | Admit: 2018-10-23 | Discharge: 2018-10-25 | DRG: 029 | Disposition: A | Discharge status: Home / Self Care | Payer: Medicaid Other | Attending: Orthopedic Surgery | Admitting: Orthopedic Surgery

## 2018-10-23 ENCOUNTER — Encounter (HOSPITAL_COMMUNITY): Payer: Self-pay | Admitting: *Deleted

## 2018-10-23 ENCOUNTER — Inpatient Hospital Stay (HOSPITAL_COMMUNITY): Payer: Medicaid Other | Admitting: Physician Assistant

## 2018-10-23 DIAGNOSIS — M4802 Spinal stenosis, cervical region: Secondary | ICD-10-CM | POA: Diagnosis present

## 2018-10-23 DIAGNOSIS — G9529 Other cord compression: Secondary | ICD-10-CM | POA: Diagnosis present

## 2018-10-23 DIAGNOSIS — G9520 Unspecified cord compression: Secondary | ICD-10-CM | POA: Diagnosis present

## 2018-10-23 DIAGNOSIS — M5410 Radiculopathy, site unspecified: Secondary | ICD-10-CM | POA: Diagnosis present

## 2018-10-23 DIAGNOSIS — Z79899 Other long term (current) drug therapy: Secondary | ICD-10-CM | POA: Diagnosis not present

## 2018-10-23 DIAGNOSIS — Z7982 Long term (current) use of aspirin: Secondary | ICD-10-CM

## 2018-10-23 DIAGNOSIS — K219 Gastro-esophageal reflux disease without esophagitis: Secondary | ICD-10-CM | POA: Diagnosis present

## 2018-10-23 DIAGNOSIS — M1990 Unspecified osteoarthritis, unspecified site: Secondary | ICD-10-CM | POA: Diagnosis present

## 2018-10-23 DIAGNOSIS — Z419 Encounter for procedure for purposes other than remedying health state, unspecified: Secondary | ICD-10-CM

## 2018-10-23 DIAGNOSIS — M5412 Radiculopathy, cervical region: Secondary | ICD-10-CM | POA: Diagnosis present

## 2018-10-23 HISTORY — PX: ANTERIOR CERVICAL DECOMP/DISCECTOMY FUSION: SHX1161

## 2018-10-23 SURGERY — ANTERIOR CERVICAL DECOMPRESSION/DISCECTOMY FUSION 2 LEVELS
Anesthesia: General | Site: Neck

## 2018-10-23 MED ORDER — FLEET ENEMA 7-19 GM/118ML RE ENEM: 1.0000 | ENEMA | Freq: Once | RECTAL | Status: DC | PRN

## 2018-10-23 MED ORDER — KETAMINE HCL 50 MG/5ML IJ SOSY
PREFILLED_SYRINGE | INTRAMUSCULAR | Status: AC
  Filled 2018-10-23: qty 10

## 2018-10-23 MED ORDER — ADULT MULTIVITAMIN W/MINERALS CH
1.0000 | ORAL_TABLET | Freq: Every day | ORAL | Status: DC
  Administered 2018-10-24 – 2018-10-25 (×2): 1 via ORAL
  Filled 2018-10-23 (×2): qty 1

## 2018-10-23 MED ORDER — PHENOL 1.4 % MT LIQD
1.0000 | OROMUCOSAL | Status: DC | PRN
  Administered 2018-10-24: 1 via OROMUCOSAL
  Filled 2018-10-23: qty 177

## 2018-10-23 MED ORDER — DEXAMETHASONE SODIUM PHOSPHATE 10 MG/ML IJ SOLN
INTRAMUSCULAR | Status: DC | PRN
  Administered 2018-10-23: 10 mg via INTRAVENOUS

## 2018-10-23 MED ORDER — DIAZEPAM 5 MG PO TABS
5.0000 mg | ORAL_TABLET | Freq: Four times a day (QID) | ORAL | Status: DC | PRN
  Administered 2018-10-23 – 2018-10-25 (×4): 5 mg via ORAL
  Filled 2018-10-23 (×4): qty 1

## 2018-10-23 MED ORDER — FAMOTIDINE 20 MG PO TABS
20.0000 mg | ORAL_TABLET | Freq: Two times a day (BID) | ORAL | Status: DC
  Administered 2018-10-23 – 2018-10-25 (×4): 20 mg via ORAL
  Filled 2018-10-23 (×3): qty 1

## 2018-10-23 MED ORDER — FENTANYL CITRATE (PF) 100 MCG/2ML IJ SOLN
INTRAMUSCULAR | Status: AC
  Filled 2018-10-23: qty 2

## 2018-10-23 MED ORDER — FENTANYL CITRATE (PF) 100 MCG/2ML IJ SOLN
INTRAMUSCULAR | Status: DC | PRN
  Administered 2018-10-23 (×2): 25 ug via INTRAVENOUS
  Administered 2018-10-23: 100 ug via INTRAVENOUS

## 2018-10-23 MED ORDER — ROCURONIUM BROMIDE 50 MG/5ML IV SOSY
PREFILLED_SYRINGE | INTRAVENOUS | Status: AC
  Filled 2018-10-23: qty 5

## 2018-10-23 MED ORDER — DEXAMETHASONE SODIUM PHOSPHATE 10 MG/ML IJ SOLN
INTRAMUSCULAR | Status: AC
  Filled 2018-10-23: qty 1

## 2018-10-23 MED ORDER — PROPOFOL 10 MG/ML IV BOLUS
INTRAVENOUS | Status: DC | PRN
  Administered 2018-10-23: 150 mg via INTRAVENOUS

## 2018-10-23 MED ORDER — ACETAMINOPHEN 650 MG RE SUPP: 650.0000 mg | RECTAL | Status: DC | PRN

## 2018-10-23 MED ORDER — SODIUM CHLORIDE 0.9% FLUSH: 3.0000 mL | INTRAVENOUS | Status: DC | PRN

## 2018-10-23 MED ORDER — BUPIVACAINE-EPINEPHRINE (PF) 0.25% -1:200000 IJ SOLN
INTRAMUSCULAR | Status: AC
  Filled 2018-10-23: qty 30

## 2018-10-23 MED ORDER — OXYCODONE-ACETAMINOPHEN 5-325 MG PO TABS
ORAL_TABLET | ORAL | Status: AC
  Filled 2018-10-23: qty 2

## 2018-10-23 MED ORDER — SODIUM CHLORIDE 0.9 % IV SOLN
250.0000 mL | INTRAVENOUS | Status: DC
  Administered 2018-10-23: 250 mL via INTRAVENOUS

## 2018-10-23 MED ORDER — CEFAZOLIN SODIUM-DEXTROSE 2-4 GM/100ML-% IV SOLN
2.0000 g | Freq: Three times a day (TID) | INTRAVENOUS | Status: AC
  Administered 2018-10-23 (×2): 2 g via INTRAVENOUS
  Filled 2018-10-23 (×2): qty 100

## 2018-10-23 MED ORDER — LACTATED RINGERS IV SOLN
INTRAVENOUS | Status: DC | PRN
  Administered 2018-10-23 (×2): via INTRAVENOUS

## 2018-10-23 MED ORDER — EPHEDRINE SULFATE-NACL 50-0.9 MG/10ML-% IV SOSY
PREFILLED_SYRINGE | INTRAVENOUS | Status: DC | PRN
  Administered 2018-10-23: 10 mg via INTRAVENOUS

## 2018-10-23 MED ORDER — SODIUM CHLORIDE 0.9 % IV SOLN
INTRAVENOUS | Status: DC | PRN
  Administered 2018-10-23: 20 ug/min via INTRAVENOUS

## 2018-10-23 MED ORDER — ZOLPIDEM TARTRATE 5 MG PO TABS: 5.0000 mg | ORAL_TABLET | Freq: Every evening | ORAL | Status: DC | PRN

## 2018-10-23 MED ORDER — ONDANSETRON HCL 4 MG/2ML IJ SOLN
INTRAMUSCULAR | Status: AC
  Filled 2018-10-23: qty 2

## 2018-10-23 MED ORDER — SODIUM CHLORIDE 0.9% FLUSH
3.0000 mL | Freq: Two times a day (BID) | INTRAVENOUS | Status: DC
  Administered 2018-10-23 – 2018-10-24 (×3): 3 mL via INTRAVENOUS

## 2018-10-23 MED ORDER — ALUM & MAG HYDROXIDE-SIMETH 200-200-20 MG/5ML PO SUSP: 30.0000 mL | Freq: Four times a day (QID) | ORAL | Status: DC | PRN

## 2018-10-23 MED ORDER — LIDOCAINE 2% (20 MG/ML) 5 ML SYRINGE
INTRAMUSCULAR | Status: DC | PRN
  Administered 2018-10-23: 100 mg via INTRAVENOUS

## 2018-10-23 MED ORDER — THROMBIN (RECOMBINANT) 20000 UNITS EX SOLR
CUTANEOUS | Status: AC
  Filled 2018-10-23: qty 20000

## 2018-10-23 MED ORDER — SUGAMMADEX SODIUM 200 MG/2ML IV SOLN
INTRAVENOUS | Status: DC | PRN
  Administered 2018-10-23: 181.4 mg via INTRAVENOUS

## 2018-10-23 MED ORDER — PROPOFOL 10 MG/ML IV BOLUS
INTRAVENOUS | Status: AC
  Filled 2018-10-23: qty 20

## 2018-10-23 MED ORDER — PHENYLEPHRINE 40 MCG/ML (10ML) SYRINGE FOR IV PUSH (FOR BLOOD PRESSURE SUPPORT)
PREFILLED_SYRINGE | INTRAVENOUS | Status: DC | PRN
  Administered 2018-10-23: 120 ug via INTRAVENOUS

## 2018-10-23 MED ORDER — CEFAZOLIN SODIUM-DEXTROSE 2-4 GM/100ML-% IV SOLN
2.0000 g | INTRAVENOUS | Status: AC
  Administered 2018-10-23: 2 g via INTRAVENOUS

## 2018-10-23 MED ORDER — POVIDONE-IODINE 7.5 % EX SOLN
Freq: Once | CUTANEOUS | Status: DC
  Filled 2018-10-23: qty 118

## 2018-10-23 MED ORDER — LIDOCAINE 2% (20 MG/ML) 5 ML SYRINGE
INTRAMUSCULAR | Status: AC
  Filled 2018-10-23: qty 5

## 2018-10-23 MED ORDER — DEXMEDETOMIDINE HCL 200 MCG/2ML IV SOLN
INTRAVENOUS | Status: DC | PRN
  Administered 2018-10-23: 8 ug via INTRAVENOUS
  Administered 2018-10-23: 12 ug via INTRAVENOUS

## 2018-10-23 MED ORDER — ACETAMINOPHEN 325 MG PO TABS: 650.0000 mg | ORAL_TABLET | ORAL | Status: DC | PRN

## 2018-10-23 MED ORDER — FENTANYL CITRATE (PF) 100 MCG/2ML IJ SOLN
25.0000 ug | INTRAMUSCULAR | Status: DC | PRN
  Administered 2018-10-23 (×2): 50 ug via INTRAVENOUS

## 2018-10-23 MED ORDER — POLYVINYL ALCOHOL 1.4 % OP SOLN
1.0000 [drp] | Freq: Every day | OPHTHALMIC | Status: DC | PRN
  Filled 2018-10-23: qty 15

## 2018-10-23 MED ORDER — SENNOSIDES-DOCUSATE SODIUM 8.6-50 MG PO TABS: 1.0000 | ORAL_TABLET | Freq: Every evening | ORAL | Status: DC | PRN

## 2018-10-23 MED ORDER — KETAMINE HCL 10 MG/ML IJ SOLN
INTRAMUSCULAR | Status: DC | PRN
  Administered 2018-10-23: 10 mg via INTRAVENOUS
  Administered 2018-10-23: 20 mg via INTRAVENOUS
  Administered 2018-10-23: 30 mg via INTRAVENOUS
  Administered 2018-10-23: 20 mg via INTRAVENOUS

## 2018-10-23 MED ORDER — ROCURONIUM BROMIDE 10 MG/ML (PF) SYRINGE
PREFILLED_SYRINGE | INTRAVENOUS | Status: DC | PRN
  Administered 2018-10-23: 70 mg via INTRAVENOUS
  Administered 2018-10-23: 10 mg via INTRAVENOUS
  Administered 2018-10-23 (×2): 30 mg via INTRAVENOUS
  Administered 2018-10-23: 10 mg via INTRAVENOUS
  Administered 2018-10-23: 20 mg via INTRAVENOUS

## 2018-10-23 MED ORDER — DIAZEPAM 5 MG PO TABS
ORAL_TABLET | ORAL | Status: AC
  Filled 2018-10-23: qty 1

## 2018-10-23 MED ORDER — ONDANSETRON HCL 4 MG PO TABS: 4.0000 mg | ORAL_TABLET | Freq: Four times a day (QID) | ORAL | Status: DC | PRN

## 2018-10-23 MED ORDER — TAMSULOSIN HCL 0.4 MG PO CAPS
0.4000 mg | ORAL_CAPSULE | Freq: Every day | ORAL | Status: DC
  Administered 2018-10-23 – 2018-10-25 (×3): 0.4 mg via ORAL
  Filled 2018-10-23 (×3): qty 1

## 2018-10-23 MED ORDER — MIDAZOLAM HCL 2 MG/2ML IJ SOLN
INTRAMUSCULAR | Status: AC
  Filled 2018-10-23: qty 2

## 2018-10-23 MED ORDER — MIDAZOLAM HCL 5 MG/5ML IJ SOLN
INTRAMUSCULAR | Status: DC | PRN
  Administered 2018-10-23: 2 mg via INTRAVENOUS

## 2018-10-23 MED ORDER — CEFAZOLIN SODIUM-DEXTROSE 2-4 GM/100ML-% IV SOLN
INTRAVENOUS | Status: AC
  Filled 2018-10-23: qty 100

## 2018-10-23 MED ORDER — ACETAMINOPHEN 500 MG PO TABS: 1000.0000 mg | ORAL_TABLET | Freq: Once | ORAL | Status: DC

## 2018-10-23 MED ORDER — OXYCODONE-ACETAMINOPHEN 5-325 MG PO TABS
1.0000 | ORAL_TABLET | ORAL | Status: DC | PRN
  Administered 2018-10-23 – 2018-10-25 (×9): 2 via ORAL
  Filled 2018-10-23 (×9): qty 2

## 2018-10-23 MED ORDER — THROMBIN 20000 UNITS EX SOLR
CUTANEOUS | Status: DC | PRN
  Administered 2018-10-23: 20000 [IU] via TOPICAL

## 2018-10-23 MED ORDER — DOCUSATE SODIUM 100 MG PO CAPS
100.0000 mg | ORAL_CAPSULE | Freq: Two times a day (BID) | ORAL | Status: DC
  Administered 2018-10-23 – 2018-10-25 (×4): 100 mg via ORAL
  Filled 2018-10-23 (×4): qty 1

## 2018-10-23 MED ORDER — ONDANSETRON HCL 4 MG/2ML IJ SOLN: 4.0000 mg | Freq: Four times a day (QID) | INTRAMUSCULAR | Status: DC | PRN

## 2018-10-23 MED ORDER — CALCIUM CARBONATE ANTACID 500 MG PO CHEW
1.0000 | CHEWABLE_TABLET | Freq: Every day | ORAL | Status: DC | PRN
  Administered 2018-10-24: 200 mg via ORAL
  Filled 2018-10-23: qty 1

## 2018-10-23 MED ORDER — GABAPENTIN 300 MG PO CAPS
300.0000 mg | ORAL_CAPSULE | Freq: Every day | ORAL | Status: DC
  Administered 2018-10-23 – 2018-10-24 (×2): 300 mg via ORAL
  Filled 2018-10-23 (×2): qty 1

## 2018-10-23 MED ORDER — HEMOSTATIC AGENTS (NO CHARGE) OPTIME
TOPICAL | Status: DC | PRN
  Administered 2018-10-23 (×2): 1

## 2018-10-23 MED ORDER — ONDANSETRON HCL 4 MG/2ML IJ SOLN
INTRAMUSCULAR | Status: DC | PRN
  Administered 2018-10-23: 4 mg via INTRAVENOUS

## 2018-10-23 MED ORDER — MUPIROCIN 2 % EX OINT
1.0000 "application " | TOPICAL_OINTMENT | Freq: Two times a day (BID) | CUTANEOUS | Status: DC
  Administered 2018-10-23 – 2018-10-25 (×3): 1 via NASAL
  Filled 2018-10-23: qty 22

## 2018-10-23 MED ORDER — ESMOLOL HCL 100 MG/10ML IV SOLN
INTRAVENOUS | Status: DC | PRN
  Administered 2018-10-23 (×2): 30 mg via INTRAVENOUS

## 2018-10-23 MED ORDER — PANTOPRAZOLE SODIUM 40 MG IV SOLR
40.0000 mg | Freq: Every day | INTRAVENOUS | Status: DC
  Administered 2018-10-23 – 2018-10-24 (×2): 40 mg via INTRAVENOUS
  Filled 2018-10-23 (×2): qty 40

## 2018-10-23 MED ORDER — FENTANYL CITRATE (PF) 250 MCG/5ML IJ SOLN
INTRAMUSCULAR | Status: AC
  Filled 2018-10-23: qty 5

## 2018-10-23 MED ORDER — BISACODYL 5 MG PO TBEC: 5.0000 mg | DELAYED_RELEASE_TABLET | Freq: Every day | ORAL | Status: DC | PRN

## 2018-10-23 MED ORDER — MENTHOL 3 MG MT LOZG: 1.0000 | LOZENGE | OROMUCOSAL | Status: DC | PRN

## 2018-10-23 MED ORDER — BUPIVACAINE-EPINEPHRINE 0.25% -1:200000 IJ SOLN: INTRAMUSCULAR | Status: DC | PRN

## 2018-10-23 MED ORDER — 0.9 % SODIUM CHLORIDE (POUR BTL) OPTIME
TOPICAL | Status: DC | PRN
  Administered 2018-10-23 (×3): 1000 mL

## 2018-10-23 SURGICAL SUPPLY — 84 items
AGENT HMST KT MTR STRL THRMB (HEMOSTASIS) ×1
APL SKNCLS STERI-STRIP NONHPOA (GAUZE/BANDAGES/DRESSINGS) ×1
BENZOIN TINCTURE PRP APPL 2/3 (GAUZE/BANDAGES/DRESSINGS) ×3 IMPLANT
BIT DRILL NEURO 2X3.1 SFT TUCH (MISCELLANEOUS) ×1 IMPLANT
BLADE CLIPPER SURG (BLADE) ×3 IMPLANT
BLADE SURG 15 STRL LF DISP TIS (BLADE) ×1 IMPLANT
BLADE SURG 15 STRL SS (BLADE) ×3
BONE VIVIGEN FORMABLE 1.3CC (Bone Implant) ×3 IMPLANT
BUR MATCHSTICK NEURO 3.0 LAGG (BURR) ×2 IMPLANT
CAGE VERT ANT 12X24-40 0D (Cage) ×2 IMPLANT
CARTRIDGE OIL MAESTRO DRILL (MISCELLANEOUS) ×1 IMPLANT
CASSETTE HEAD DISP 12 (Head) ×2 IMPLANT
CLOSURE STERI-STRIP 1/4X4 (GAUZE/BANDAGES/DRESSINGS) ×2 IMPLANT
CLOSURE WOUND 1/2 X4 (GAUZE/BANDAGES/DRESSINGS) ×1
COLLAR CERV LO CONTOUR FIRM DE (SOFTGOODS) IMPLANT
CORDS BIPOLAR (ELECTRODE) ×3 IMPLANT
COVER MAYO STAND STRL (DRAPES) ×6 IMPLANT
COVER SURGICAL LIGHT HANDLE (MISCELLANEOUS) ×3 IMPLANT
COVER WAND RF STERILE (DRAPES) ×3 IMPLANT
CRADLE DONUT ADULT HEAD (MISCELLANEOUS) ×3 IMPLANT
DIFFUSER DRILL AIR PNEUMATIC (MISCELLANEOUS) ×3 IMPLANT
DRAIN JACKSON RD 7FR 3/32 (WOUND CARE) IMPLANT
DRAPE C-ARM 42X72 X-RAY (DRAPES) ×3 IMPLANT
DRAPE POUCH INSTRU U-SHP 10X18 (DRAPES) ×3 IMPLANT
DRAPE SURG 17X23 STRL (DRAPES) ×12 IMPLANT
DRILL NEURO 2X3.1 SOFT TOUCH (MISCELLANEOUS) ×3
DURAPREP 26ML APPLICATOR (WOUND CARE) ×3 IMPLANT
ELECT COATED BLADE 2.86 ST (ELECTRODE) ×3 IMPLANT
ELECT REM PT RETURN 9FT ADLT (ELECTROSURGICAL) ×3
ELECTRODE REM PT RTRN 9FT ADLT (ELECTROSURGICAL) ×1 IMPLANT
EVACUATOR SILICONE 100CC (DRAIN) IMPLANT
GAUZE 4X4 16PLY RFD (DISPOSABLE) ×3 IMPLANT
GAUZE SPONGE 4X4 12PLY STRL (GAUZE/BANDAGES/DRESSINGS) ×3 IMPLANT
GLOVE BIO SURGEON STRL SZ7 (GLOVE) ×5 IMPLANT
GLOVE BIO SURGEON STRL SZ8 (GLOVE) ×3 IMPLANT
GLOVE BIOGEL PI IND STRL 7.0 (GLOVE) ×2 IMPLANT
GLOVE BIOGEL PI IND STRL 8 (GLOVE) ×1 IMPLANT
GLOVE BIOGEL PI INDICATOR 7.0 (GLOVE) ×4
GLOVE BIOGEL PI INDICATOR 8 (GLOVE) ×2
GLOVE ECLIPSE 7.0 STRL STRAW (GLOVE) ×4 IMPLANT
GOWN STRL REUS W/ TWL LRG LVL3 (GOWN DISPOSABLE) ×1 IMPLANT
GOWN STRL REUS W/ TWL XL LVL3 (GOWN DISPOSABLE) ×1 IMPLANT
GOWN STRL REUS W/TWL LRG LVL3 (GOWN DISPOSABLE) ×3
GOWN STRL REUS W/TWL XL LVL3 (GOWN DISPOSABLE) ×3
GRAFT BNE MATRIX VG FRMBL SM 1 (Bone Implant) IMPLANT
IV CATH 14GX2 1/4 (CATHETERS) ×3 IMPLANT
KIT BASIN OR (CUSTOM PROCEDURE TRAY) ×3 IMPLANT
KIT TURNOVER KIT B (KITS) ×3 IMPLANT
LAWSON 116500 12MM CASSETTE HEAD ×2 IMPLANT
MANIFOLD NEPTUNE II (INSTRUMENTS) ×3 IMPLANT
NDL PRECISIONGLIDE 27X1.5 (NEEDLE) ×1 IMPLANT
NDL SPNL 20GX3.5 QUINCKE YW (NEEDLE) ×1 IMPLANT
NEEDLE PRECISIONGLIDE 27X1.5 (NEEDLE) ×3 IMPLANT
NEEDLE SPNL 20GX3.5 QUINCKE YW (NEEDLE) ×3 IMPLANT
NS IRRIG 1000ML POUR BTL (IV SOLUTION) ×7 IMPLANT
OIL CARTRIDGE MAESTRO DRILL (MISCELLANEOUS) ×3
PACK ORTHO CERVICAL (CUSTOM PROCEDURE TRAY) ×3 IMPLANT
PAD ARMBOARD 7.5X6 YLW CONV (MISCELLANEOUS) ×6 IMPLANT
PATTIES SURGICAL .5 X.5 (GAUZE/BANDAGES/DRESSINGS) IMPLANT
PATTIES SURGICAL .5 X1 (DISPOSABLE) ×3 IMPLANT
PIN DISTRACTION 14 (PIN) ×4 IMPLANT
PLATE SKYLINE 2 LEVEL 34MM (Plate) ×2 IMPLANT
RESTRAINT LIMB HOLDER UNIV (RESTRAINTS) ×2 IMPLANT
SCREW SET VBR SMALL (Screw) ×2 IMPLANT
SCREW SKYLINE VAR OS 14MM (Screw) ×8 IMPLANT
SPONGE INTESTINAL PEANUT (DISPOSABLE) ×3 IMPLANT
SPONGE SURGIFOAM ABS GEL 100 (HEMOSTASIS) ×3 IMPLANT
STRIP CLOSURE SKIN 1/2X4 (GAUZE/BANDAGES/DRESSINGS) ×2 IMPLANT
SURGIFLO W/THROMBIN 8M KIT (HEMOSTASIS) ×2 IMPLANT
SUT BONE WAX W31G (SUTURE) ×6 IMPLANT
SUT MNCRL AB 4-0 PS2 18 (SUTURE) ×3 IMPLANT
SUT SILK 4 0 (SUTURE)
SUT SILK 4-0 18XBRD TIE 12 (SUTURE) IMPLANT
SUT VIC AB 2-0 CT2 18 VCP726D (SUTURE) ×3 IMPLANT
SYR BULB IRRIGATION 50ML (SYRINGE) ×3 IMPLANT
SYR CONTROL 10ML LL (SYRINGE) ×9 IMPLANT
TAPE CLOTH 4X10 WHT NS (GAUZE/BANDAGES/DRESSINGS) ×3 IMPLANT
TAPE CLOTH SURG 4X10 WHT LF (GAUZE/BANDAGES/DRESSINGS) ×2 IMPLANT
TAPE UMBILICAL COTTON 1/8X30 (MISCELLANEOUS) ×3 IMPLANT
TOWEL OR 17X24 6PK STRL BLUE (TOWEL DISPOSABLE) ×3 IMPLANT
TOWEL OR 17X26 10 PK STRL BLUE (TOWEL DISPOSABLE) ×3 IMPLANT
TRAY FOLEY MTR SLVR 16FR STAT (SET/KITS/TRAYS/PACK) ×2 IMPLANT
WATER STERILE IRR 1000ML POUR (IV SOLUTION) ×3 IMPLANT
YANKAUER SUCT BULB TIP NO VENT (SUCTIONS) ×3 IMPLANT

## 2018-10-23 NOTE — H&P (Signed)
PREOPERATIVE H&P  Chief Complaint: Left arm pain, balance deterioration  HPI: Ethan Williams is a 58 y.o. male who presents with ongoing pain in the left arm and deterioration in balance  MRI reveals spinal cord compression and neuroforaminal stenosis involving 5/6 and C6/7 with compression also noted behind the C6 vertebral body  Patient has failed multiple forms of conservative care and continues to have pain (see office notes for additional details regarding the patient's full course of treatment)  Past Medical History:  Diagnosis Date  . Anginal pain (HCC)   . Arthritis    "right shoulder" (06/06/2012), "finger joints"  . Daily headache    "here lately they've been daily" (06/06/2012)  . Dysrhythmia    "palpitations" (06/06/2012)  . External hemorrhoid, bleeding   . GERD (gastroesophageal reflux disease)   . Heart murmur    "as a child" (06/06/2012)  . Migraines    "treated for them in the last 6 months (06/06/2012)  . Pneumonia 2012; 06/06/2012  . Seasonal allergies    "outdoor; pollen, grass" (06/06/2012)  . Shortness of breath 06/06/2012   "at rest; lying down; w/exertion"   Past Surgical History:  Procedure Laterality Date  . APPENDECTOMY  2001  . SHOULDER SURGERY Right    Social History   Socioeconomic History  . Marital status: Single    Spouse name: Not on file  . Number of children: Not on file  . Years of education: Not on file  . Highest education level: Not on file  Occupational History  . Occupation: unemployed  Social Needs  . Financial resource strain: Not on file  . Food insecurity:    Worry: Not on file    Inability: Not on file  . Transportation needs:    Medical: Not on file    Non-medical: Not on file  Tobacco Use  . Smoking status: Current Every Day Smoker    Packs/day: 1.00    Years: 40.00    Pack years: 40.00    Types: Cigarettes  . Smokeless tobacco: Never Used  Substance and Sexual Activity  . Alcohol use: No    Comment:  06/06/2012 "History of abuse ; sober since 07/2009"  . 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 Sexual activity: Yes  Lifestyle  . Physical activity:    Days per week: Not on file    Minutes per session: Not on file  . Stress: Not on file  Relationships  . Social connections:    Talks on phone: Not on file    Gets together: Not on file    Attends religious service: Not on file    Active member of club or organization: Not on file    Attends meetings of clubs or organizations: Not on file    Relationship status: Not on file  Other Topics Concern  . Not on file  Social History Narrative   Lives with Parents   Unemployeed   Family History  Problem Relation Age of Onset  . Cancer Father    No Known Allergies Prior to Admission medications   Medication Sig Start Date End Date Taking? Authorizing Provider  acetaminophen (TYLENOL) 500 MG tablet Take 1,000 mg by mouth 2 (two) times daily as needed for moderate pain.   Yes [provider]  aspirin 325 MG tablet Take  650 mg by mouth every 6 (six) hours as needed for mild pain.    Yes [provider]  calcium carbonate (TUMS - DOSED IN MG ELEMENTAL CALCIUM) 500 MG chewable tablet Chew 1 tablet by mouth daily as needed for indigestion or heartburn.   Yes [provider]  Carboxymethylcellul-Glycerin (CLEAR EYES FOR DRY EYES) 1-0.25 % SOLN Apply 1 drop to eye daily as needed (dry eyes).   Yes [provider]  famotidine (PEPCID) 20 MG tablet Take 1 tablet (20 mg total) by mouth 2 (two) times daily. 07/23/18  Yes Garth Bigness, MD  gabapentin (NEURONTIN) 300 MG capsule TAKE 1 CAPSULE BY MOUTH EVERY NIGHT AT BEDTIME 10/21/18  Yes Ralene Cork, DO  Multiple Vitamin (MULTIVITAMIN WITH MINERALS) TABS tablet Take 1 tablet by mouth daily.   Yes [provider]  tamsulosin (FLOMAX) 0.4 MG CAPS capsule Take 1 capsule (0.4 mg total) by mouth daily. Patient not taking: Reported on 07/30/2018 04/16/18   Cristina Gong, PA-C     All other systems have been reviewed and were otherwise negative with the exception of those mentioned in the HPI and as above.  Physical Exam: Vitals:   10/23/18 0640  BP: (!) 149/88  Pulse: 63  Resp: 18  Temp: 97.7 F (36.5 C)  SpO2: 97%    Body mass index is 27.12 kg/m.  General: Alert, no acute distress Cardiovascular: No pedal edema Respiratory: No cyanosis, no use of accessory musculature Skin: No lesions in the area of chief complaint Neurologic: Sensation intact distally Psychiatric: Patient is competent for consent with normal mood and affect Lymphatic: No axillary or cervical lymphadenopathy  MUSCULOSKELETAL: + spurling's sign on the left  Assessment/Plan: LEFT-SIDED CERVICAL RADICULOPATHY WITH CERVICAL MYELOPATHY WITH SEVERE NEUROFORAMINAL STENOSIS AND SPINAL CORD COMPRESSION Plan for Procedure(s): ANTERIOR CERVICAL DECOMPRESSION FUSION, CERVICAL 5-6, CERVICAL 6-7 , WITH C6 CORPECTOMY   Jackelyn Hoehn, MD 10/23/2018 7:43 AM

## 2018-10-23 NOTE — Anesthesia Preprocedure Evaluation (Addendum)
Anesthesia Evaluation  Patient identified by MRN, date of birth, ID band Patient awake    Reviewed: Allergy & Precautions, NPO status , Patient's Chart, lab work & pertinent test results  Airway Mallampati: III  TM Distance: >3 FB Neck ROM: Limited  Mouth opening: Limited Mouth Opening Comment: C collar Dental  (+) Chipped, Dental Advisory Given, Teeth Intact,    Pulmonary shortness of breath, pneumonia, resolved, Current Smoker,    Pulmonary exam normal breath sounds clear to auscultation       Cardiovascular + angina Normal cardiovascular exam+ dysrhythmias + Valvular Problems/Murmurs  Rhythm:Regular Rate:Normal     Neuro/Psych  Headaches,  Neuromuscular disease negative psych ROS   GI/Hepatic Neg liver ROS, GERD  Medicated and Controlled,  Endo/Other  negative endocrine ROS  Renal/GU negative Renal ROS     Musculoskeletal  (+) Arthritis ,   Abdominal   Peds  Hematology negative hematology ROS (+)   Anesthesia Other Findings   Reproductive/Obstetrics                            Anesthesia Physical Anesthesia Plan  ASA: II  Anesthesia Plan: General   Post-op Pain Management:    Induction: Intravenous  PONV Risk Score and Plan: 1 and Ondansetron, Dexamethasone and Midazolam  Airway Management Planned: Oral ETT and Video Laryngoscope Planned  Additional Equipment: None  Intra-op Plan:   Post-operative Plan: Extubation in OR  Informed Consent: I have reviewed the patients History and Physical, chart, labs and discussed the procedure including the risks, benefits and alternatives for the proposed anesthesia with the patient or authorized representative who has indicated his/her understanding and acceptance.     Dental advisory given  Plan Discussed with: CRNA  Anesthesia Plan Comments:         Anesthesia Quick Evaluation

## 2018-10-23 NOTE — Anesthesia Procedure Notes (Signed)
Procedure Name: Intubation Performed by: Ezekiel Ina, CRNA Pre-anesthesia Checklist: Patient identified, Emergency Drugs available, Suction available and Patient being monitored Patient Re-evaluated:Patient Re-evaluated prior to induction Oxygen Delivery Method: Circle System Utilized Preoxygenation: Pre-oxygenation with 100% oxygen Induction Type: IV induction Ventilation: Mask ventilation without difficulty and Oral airway inserted - appropriate to patient size Laryngoscope Size: Glidescope and 4 Grade View: Grade I Tube type: Oral Tube size: 7.5 mm Number of attempts: 1 Airway Equipment and Method: Stylet and Oral airway Placement Confirmation: ETT inserted through vocal cords under direct vision,  positive ETCO2 and breath sounds checked- equal and bilateral Secured at: 23 cm Tube secured with: Tape Dental Injury: Teeth and Oropharynx as per pre-operative assessment

## 2018-10-23 NOTE — Transfer of Care (Signed)
Immediate Anesthesia Transfer of Care Note  Patient: Ethan Williams  Procedure(s) Performed: ANTERIOR CERVICAL DECOMPRESSION FUSION, CERVICAL 5-6, CERVICAL 6-7 , WITH POSSIBLE C6-7 CORPECTOMY (N/A Neck)  Patient Location: PACU  Anesthesia Type:General  Level of Consciousness: awake and alert   Airway & Oxygen Therapy: Patient Spontanous Breathing and Patient connected to face mask oxygen  Post-op Assessment: Report given to RN, Post -op Vital signs reviewed and stable and Patient moving all extremities X 4  Post vital signs: Reviewed and stable  Last Vitals:  Vitals Value Taken Time  BP 146/96 10/23/2018 12:31 PM  Temp    Pulse 97 10/23/2018 12:36 PM  Resp 15 10/23/2018 12:36 PM  SpO2 91 % 10/23/2018 12:36 PM  Vitals shown include unvalidated device data.  Last Pain:  Vitals:   10/23/18 1236  TempSrc:   PainSc: 10-Worst pain ever         Complications: No apparent anesthesia complications

## 2018-10-23 NOTE — Op Note (Signed)
PATIENT NAME: Ethan Williams   MEDICAL RECORD NO.:   [id number]    PHYSICIAN:  Estill Bamberg, MD      DATE OF BIRTH: November 19, 1960   DATE OF PROCEDURE: 10/23/2018                               OPERATIVE REPORT     PREOPERATIVE DIAGNOSES: 1. Left-sided cervical radiculopathy. 2. Cervical myelopathy 3. Spinal cord compression and NF stenosis spanning C5/6 and C6/7   POSTOPERATIVE DIAGNOSES: 1. Left-sided cervical radiculopathy. 2. Cervical myelopathy 3. Spinal cord compression and NF stenosis spanning C5/6 and C6/7   PROCEDURE: 1. Anterior cervical decompression and fusion C5/6, C6/7 2. C6 corpectomy (to remove migrated disc fragment behind C6 vertebral body) 3. Placement of anterior instrumentation, C5-C7. 4. Insertion of interbody/corpectomy device x 1 (Expandible Urlich spacer). 5. Intraoperative use of fluoroscopy. 6. Use of morselized allograft - ViviGen.   SURGEON:  Estill Bamberg, MD   ASSISTANT:  Jason Coop, PA-C.   ANESTHESIA:  General endotracheal anesthesia.   COMPLICATIONS:  None.   DISPOSITION:  Stable.   ESTIMATED BLOOD LOSS:  200cc.   INDICATIONS FOR SURGERY:  Briefly, Mr. Kilmer is a pleasant 58 y.o. year- old male, who did present to me with severe pain in the neck and left arm, in addition to deterioration in balance.  The patient's MRI did reveal the findings noted above.  Given the patient's ongoing rather debilitating pain and lack of improvement with appropriate treatment measures, we did discuss proceeding with the procedure noted above.  The patient was fully aware of the risks and limitations of surgery as outlined in my preoperative note.   OPERATIVE DETAILS:  On 10/23/2018 the patient was brought to surgery and general endotracheal anesthesia was administered.  The patient was placed supine on the hospital bed. The neck was gently extended.  All bony prominences were meticulously padded.  The neck was prepped and draped in the usual sterile  fashion.  At this point, I did make a left-sided transverse incision.  The platysma was incised.  A Smith-Robinson approach was used and the anterior spine was identified. A self-retaining retractor was placed.  I then subperiosteally exposed the vertebral bodies from C5-C7.  Caspar pins were then placed into the C6 and C7 vertebral bodies and distraction was applied.  A thorough and complete C6-7 intervertebral diskectomy was performed. The posterior longitudinal ligament was identified and entered using a nerve hook. I then used #1 followed by #2 Kerrison to perform a thorough and complete intervertebral diskectomy.  The spinal canal was thoroughly decompressed, as was the right and left neuroforamen. There was however noted to be additional disc material behind the C6 vertebral body. The lower Caspar pin was then removed and placed into the C5 vertebral body and once again, distraction was applied across the C5-6 intervertebral space.  I then again performed a thorough and complete diskectomy, thoroughly decompressing the spinal canal and bilateral neuroforamena. The lower Caspar pin was then removed and placed back into the C7 vertebral body and once again, distraction was applied across C5-7. At this point, a corpectomy was performed. Specifically, the anterior vertebral body was removed using a rongeur and a high-speed bur. The posterior longitudinal ligament was noted and removed longitudinally. This did allow me to remove  additional disc fragments behind the C6 vertebral body, additionally decompressing the spinal cord. Some fragments were adherent to the dura, but were not compressive  so they were maintained in order decrease disk of a duratomy. After preparing the endplates, the appropriate-sized expandable spacer was packed with ViviGen and tamped into position, after which point it was expanded until an excellent fit was noted.   The Caspar pins then were removed and bone wax was  placed in their place.  The appropriate-sized anterior cervical plate was placed over the anterior spine.  14 mm variable angle screws were placed, 2 in each vertebral body at C5 and C7 for a total of 4 vertebral body screws.  The screws were then locked to the plate using the Cam locking mechanism.  I was very pleased with the final fluoroscopic images.  The wound was then irrigated.  The wound was then explored for any undue bleeding and there was no bleeding noted. The wound was then closed in layers using 2-0 Vicryl, followed by 4-0 Monocryl.  Benzoin and Steri-Strips were applied, followed by sterile dressing.  All instrument counts were correct at the termination of the procedure.   Of note, Jason Coop, PA-C, was my assistant throughout surgery, and did aid in retraction, suctioning, and closure from start to finish.         Estill Bamberg, MD

## 2018-10-24 ENCOUNTER — Inpatient Hospital Stay (HOSPITAL_COMMUNITY)
Admission: RE | Disposition: A | Discharge status: Home / Self Care | Payer: Self-pay | Source: Home / Self Care | Attending: Orthopedic Surgery

## 2018-10-24 ENCOUNTER — Encounter (HOSPITAL_COMMUNITY): Payer: Self-pay | Admitting: Certified Registered Nurse Anesthetist

## 2018-10-24 ENCOUNTER — Inpatient Hospital Stay (HOSPITAL_COMMUNITY): Payer: Medicaid Other | Admitting: Anesthesiology

## 2018-10-24 ENCOUNTER — Inpatient Hospital Stay (HOSPITAL_COMMUNITY): Admission: RE | Admit: 2018-10-24 | Payer: Medicaid Other | Source: Home / Self Care | Admitting: Orthopedic Surgery

## 2018-10-24 ENCOUNTER — Inpatient Hospital Stay (HOSPITAL_COMMUNITY): Payer: Medicaid Other

## 2018-10-24 ENCOUNTER — Other Ambulatory Visit: Payer: Self-pay

## 2018-10-24 HISTORY — PX: POSTERIOR CERVICAL FUSION/FORAMINOTOMY: SHX5038

## 2018-10-24 SURGERY — POSTERIOR CERVICAL FUSION/FORAMINOTOMY LEVEL 4
Anesthesia: General | Laterality: Bilateral

## 2018-10-24 MED ORDER — ROCURONIUM BROMIDE 50 MG/5ML IV SOSY
PREFILLED_SYRINGE | INTRAVENOUS | Status: DC | PRN
  Administered 2018-10-24: 20 mg via INTRAVENOUS
  Administered 2018-10-24 (×2): 50 mg via INTRAVENOUS
  Administered 2018-10-24: 20 mg via INTRAVENOUS

## 2018-10-24 MED ORDER — SODIUM CHLORIDE 0.9 % IV SOLN
INTRAVENOUS | Status: DC | PRN
  Administered 2018-10-24: 15 ug/min via INTRAVENOUS

## 2018-10-24 MED ORDER — LACTATED RINGERS IV SOLN
INTRAVENOUS | Status: DC | PRN
  Administered 2018-10-24 (×2): via INTRAVENOUS

## 2018-10-24 MED ORDER — LIDOCAINE-EPINEPHRINE 1 %-1:100000 IJ SOLN
INTRAMUSCULAR | Status: DC | PRN
  Administered 2018-10-24: 10 mL via INTRADERMAL

## 2018-10-24 MED ORDER — HYDROMORPHONE HCL 1 MG/ML IJ SOLN: 0.2500 mg | INTRAMUSCULAR | Status: DC | PRN

## 2018-10-24 MED ORDER — SUGAMMADEX SODIUM 200 MG/2ML IV SOLN
INTRAVENOUS | Status: DC | PRN
  Administered 2018-10-24: 200 mg via INTRAVENOUS

## 2018-10-24 MED ORDER — ONDANSETRON HCL 4 MG/2ML IJ SOLN
INTRAMUSCULAR | Status: DC | PRN
  Administered 2018-10-24: 4 mg via INTRAVENOUS

## 2018-10-24 MED ORDER — BACITRACIN ZINC 500 UNIT/GM EX OINT
TOPICAL_OINTMENT | CUTANEOUS | Status: DC | PRN
  Administered 2018-10-24: 1 via TOPICAL

## 2018-10-24 MED ORDER — LIDOCAINE 2% (20 MG/ML) 5 ML SYRINGE
INTRAMUSCULAR | Status: DC | PRN
  Administered 2018-10-24: 100 mg via INTRAVENOUS

## 2018-10-24 MED ORDER — PROMETHAZINE HCL 25 MG/ML IJ SOLN: 6.2500 mg | INTRAMUSCULAR | Status: DC | PRN

## 2018-10-24 MED ORDER — PHENYLEPHRINE HCL 10 MG/ML IJ SOLN
INTRAMUSCULAR | Status: DC | PRN
  Administered 2018-10-24: 40 ug via INTRAVENOUS

## 2018-10-24 MED ORDER — CEFAZOLIN SODIUM-DEXTROSE 2-3 GM-%(50ML) IV SOLR
INTRAVENOUS | Status: DC | PRN
  Administered 2018-10-24: 2 g via INTRAVENOUS

## 2018-10-24 MED ORDER — BUPIVACAINE LIPOSOME 1.3 % IJ SUSP
20.0000 mL | Freq: Once | INTRAMUSCULAR | Status: AC
  Administered 2018-10-24: 20 mL
  Filled 2018-10-24: qty 20

## 2018-10-24 MED ORDER — ARTIFICIAL TEARS OPHTHALMIC OINT
TOPICAL_OINTMENT | OPHTHALMIC | Status: DC | PRN
  Administered 2018-10-24: 1 via OPHTHALMIC

## 2018-10-24 MED ORDER — DIAZEPAM 5 MG PO TABS: 5.0000 mg | ORAL_TABLET | Freq: Four times a day (QID) | ORAL | 0 refills | Status: DC | PRN

## 2018-10-24 MED ORDER — HEMOSTATIC AGENTS (NO CHARGE) OPTIME
TOPICAL | Status: DC | PRN
  Administered 2018-10-24: 1 via TOPICAL

## 2018-10-24 MED ORDER — PROPOFOL 10 MG/ML IV BOLUS
INTRAVENOUS | Status: DC | PRN
  Administered 2018-10-24: 160 mg via INTRAVENOUS

## 2018-10-24 MED ORDER — DIPHENHYDRAMINE HCL 50 MG/ML IJ SOLN
INTRAMUSCULAR | Status: DC | PRN
  Administered 2018-10-24: 12.5 mg via INTRAVENOUS

## 2018-10-24 MED ORDER — FENTANYL CITRATE (PF) 100 MCG/2ML IJ SOLN
INTRAMUSCULAR | Status: DC | PRN
  Administered 2018-10-24 (×2): 50 ug via INTRAVENOUS
  Administered 2018-10-24: 150 ug via INTRAVENOUS

## 2018-10-24 MED ORDER — MIDAZOLAM HCL 5 MG/5ML IJ SOLN
INTRAMUSCULAR | Status: DC | PRN
  Administered 2018-10-24: 2 mg via INTRAVENOUS

## 2018-10-24 MED ORDER — THROMBIN 20000 UNITS EX SOLR
CUTANEOUS | Status: DC | PRN
  Administered 2018-10-24: 20000 [IU] via TOPICAL

## 2018-10-24 MED ORDER — MEPERIDINE HCL 50 MG/ML IJ SOLN: 6.2500 mg | INTRAMUSCULAR | Status: DC | PRN

## 2018-10-24 MED ORDER — OXYCODONE-ACETAMINOPHEN 5-325 MG PO TABS: 1.0000 | ORAL_TABLET | ORAL | 0 refills | Status: DC | PRN

## 2018-10-24 MED FILL — Thrombin (Recombinant) For Soln 20000 Unit: CUTANEOUS | Qty: 1 | Status: AC

## 2018-10-24 SURGICAL SUPPLY — 94 items
APL SKNCLS STERI-STRIP NONHPOA (GAUZE/BANDAGES/DRESSINGS) ×1
BENZOIN TINCTURE PRP APPL 2/3 (GAUZE/BANDAGES/DRESSINGS) ×4 IMPLANT
BIT DRILL MOUNTAINEER FIX 14 (BIT) ×1
BIT DRILL MOUNTAINEER FIX 14MM (BIT) IMPLANT
BLADE CLIPPER SURG NEURO (BLADE) IMPLANT
BONE VIVIGEN FORMABLE 1.3CC (Bone Implant) ×3 IMPLANT
BUR NEURO DRILL SOFT 3.0X3.8M (BURR) ×3 IMPLANT
BUR PRESCISION 1.7 ELITE (BURR) ×3 IMPLANT
CLOSURE WOUND 1/2 X4 (GAUZE/BANDAGES/DRESSINGS) ×1
CONT SPEC 4OZ CLIKSEAL STRL BL (MISCELLANEOUS) ×5 IMPLANT
CORDS BIPOLAR (ELECTRODE) ×3 IMPLANT
COVER BACK TABLE 80X110 HD (DRAPES) ×3 IMPLANT
COVER SURGICAL LIGHT HANDLE (MISCELLANEOUS) ×3 IMPLANT
COVER WAND RF STERILE (DRAPES) ×3 IMPLANT
DRAIN CHANNEL 10F 3/8 F FF (DRAIN) IMPLANT
DRAIN CHANNEL 15F RND FF W/TCR (WOUND CARE) IMPLANT
DRAIN HEMOVAC 1/8 X 5 (WOUND CARE) IMPLANT
DRAPE C-ARM 42X72 X-RAY (DRAPES) ×6 IMPLANT
DRAPE HALF SHEET 40X57 (DRAPES) ×7 IMPLANT
DRAPE INCISE IOBAN 66X45 STRL (DRAPES) ×3 IMPLANT
DRAPE PED LAPAROTOMY (DRAPES) ×3 IMPLANT
DRAPE POUCH INSTRU U-SHP 10X18 (DRAPES) ×3 IMPLANT
DRAPE SURG 17X23 STRL (DRAPES) ×24 IMPLANT
DRAPE UNIVERSAL PACK (DRAPES) ×3 IMPLANT
DRILL BIT MOUNTAINEER FIX 14MM (BIT) ×3
DRSG MEPILEX BORDER 4X8 (GAUZE/BANDAGES/DRESSINGS) ×3 IMPLANT
DURAPREP 26ML APPLICATOR (WOUND CARE) ×3 IMPLANT
ELECT BLADE 4.0 EZ CLEAN MEGAD (MISCELLANEOUS) ×3
ELECT CAUTERY BLADE 6.4 (BLADE) ×3 IMPLANT
ELECT REM PT RETURN 9FT ADLT (ELECTROSURGICAL) ×3
ELECTRODE BLDE 4.0 EZ CLN MEGD (MISCELLANEOUS) ×1 IMPLANT
ELECTRODE REM PT RTRN 9FT ADLT (ELECTROSURGICAL) ×1 IMPLANT
EVACUATOR SILICONE 100CC (DRAIN) IMPLANT
GAUZE 4X4 16PLY RFD (DISPOSABLE) ×6 IMPLANT
GAUZE SPONGE 4X4 12PLY STRL (GAUZE/BANDAGES/DRESSINGS) ×3 IMPLANT
GAUZE SPONGE 4X4 16PLY XRAY LF (GAUZE/BANDAGES/DRESSINGS) ×2 IMPLANT
GLOVE BIO SURGEON STRL SZ7 (GLOVE) ×3 IMPLANT
GLOVE BIO SURGEON STRL SZ8 (GLOVE) ×3 IMPLANT
GLOVE BIOGEL PI IND STRL 7.5 (GLOVE) ×1 IMPLANT
GLOVE BIOGEL PI IND STRL 8 (GLOVE) ×1 IMPLANT
GLOVE BIOGEL PI INDICATOR 7.5 (GLOVE) ×2
GLOVE BIOGEL PI INDICATOR 8 (GLOVE) ×2
GLOVE ECLIPSE 7.5 STRL STRAW (GLOVE) ×2 IMPLANT
GOWN STRL REUS W/ TWL LRG LVL3 (GOWN DISPOSABLE) ×4 IMPLANT
GOWN STRL REUS W/ TWL XL LVL3 (GOWN DISPOSABLE) ×1 IMPLANT
GOWN STRL REUS W/TWL LRG LVL3 (GOWN DISPOSABLE) ×12
GOWN STRL REUS W/TWL XL LVL3 (GOWN DISPOSABLE) ×3
GRAFT BNE MATRIX VG FRMBL SM 1 (Bone Implant) IMPLANT
IV CATH 14GX2 1/4 (CATHETERS) ×3 IMPLANT
KIT BASIN OR (CUSTOM PROCEDURE TRAY) ×3 IMPLANT
KIT TURNOVER KIT B (KITS) ×3 IMPLANT
NDL HYPO 21X1.5 SAFETY (NEEDLE) IMPLANT
NDL HYPO 25GX1X1/2 BEV (NEEDLE) ×1 IMPLANT
NDL PRECISIONGLIDE 27X1.5 (NEEDLE) ×1 IMPLANT
NEEDLE HYPO 21X1.5 SAFETY (NEEDLE) ×3 IMPLANT
NEEDLE HYPO 25GX1X1/2 BEV (NEEDLE) ×3 IMPLANT
NEEDLE PRECISIONGLIDE 27X1.5 (NEEDLE) ×3 IMPLANT
NS IRRIG 1000ML POUR BTL (IV SOLUTION) ×3 IMPLANT
PACK LAMINECTOMY ORTHO (CUSTOM PROCEDURE TRAY) ×3 IMPLANT
PAD ARMBOARD 7.5X6 YLW CONV (MISCELLANEOUS) ×6 IMPLANT
PATTIES SURGICAL .5 X.5 (GAUZE/BANDAGES/DRESSINGS) ×3 IMPLANT
PATTIES SURGICAL .5 X1 (DISPOSABLE) ×2 IMPLANT
PATTIES SURGICAL .5 X3 (DISPOSABLE) IMPLANT
PATTIES SURGICAL .5X1.5 (GAUZE/BANDAGES/DRESSINGS) ×3 IMPLANT
PIN MAYFIELD SKULL DISP (PIN) ×3 IMPLANT
PUTTY DBX 1CC (Putty) ×3 IMPLANT
PUTTY DBX 1CC DEPUY (Putty) IMPLANT
ROD MOUNTAINEER 120MM (Rod) ×2 IMPLANT
SCREW F A 3.5X14 (Screw) ×4 IMPLANT
SCREW FA MOUNTAINEER 22MM (Screw) ×4 IMPLANT
SCREW INNER (Screw) ×8 IMPLANT
SPONGE INTESTINAL PEANUT (DISPOSABLE) ×2 IMPLANT
SPONGE LAP 4X18 RFD (DISPOSABLE) IMPLANT
SPONGE SURGIFOAM ABS GEL 100 (HEMOSTASIS) ×3 IMPLANT
STRIP CLOSURE SKIN 1/2X4 (GAUZE/BANDAGES/DRESSINGS) ×1 IMPLANT
SURGIFLO W/THROMBIN 8M KIT (HEMOSTASIS) IMPLANT
SUT MNCRL AB 4-0 PS2 18 (SUTURE) ×3 IMPLANT
SUT VIC AB 0 CT1 18XCR BRD 8 (SUTURE) ×1 IMPLANT
SUT VIC AB 0 CT1 8-18 (SUTURE) ×3
SUT VIC AB 1 CT1 18XCR BRD 8 (SUTURE) ×2 IMPLANT
SUT VIC AB 1 CT1 8-18 (SUTURE) ×6
SUT VIC AB 2-0 CT2 18 VCP726D (SUTURE) ×5 IMPLANT
SYR 20CC LL (SYRINGE) ×2 IMPLANT
SYR BULB IRRIGATION 50ML (SYRINGE) ×3 IMPLANT
SYR CONTROL 10ML LL (SYRINGE) ×5 IMPLANT
TAP MOUNTAINEER 3MM (TAP) ×2 IMPLANT
TAP SURG 3.5 MOUNTAINEER (BIT) ×2 IMPLANT
TAPE CLOTH 4X10 WHT NS (GAUZE/BANDAGES/DRESSINGS) ×3 IMPLANT
TEMPLATE ROD MOUNTAINEER (INSTRUMENTS) ×2 IMPLANT
TOWEL OR 17X24 6PK STRL BLUE (TOWEL DISPOSABLE) ×3 IMPLANT
TOWEL OR 17X26 10 PK STRL BLUE (TOWEL DISPOSABLE) ×3 IMPLANT
TRAY FOLEY MTR SLVR 16FR STAT (SET/KITS/TRAYS/PACK) ×1 IMPLANT
WATER STERILE IRR 1000ML POUR (IV SOLUTION) ×3 IMPLANT
YANKAUER SUCT BULB TIP NO VENT (SUCTIONS) ×3 IMPLANT

## 2018-10-24 NOTE — Anesthesia Postprocedure Evaluation (Signed)
Anesthesia Post Note  Patient: Ethan Williams  Procedure(s) Performed: CERVICAL FIVE-SEVEN POSTERIOR SPINAL FUSION WITH INSTRUMENTATION AND ALLOGRAFT (Bilateral )     Patient location during evaluation: PACU Anesthesia Type: General Level of consciousness: sedated and patient cooperative Pain management: pain level controlled Vital Signs Assessment: post-procedure vital signs reviewed and stable Respiratory status: spontaneous breathing Cardiovascular status: stable Anesthetic complications: no    Last Vitals:  Vitals:   10/24/18 1246 10/24/18 1439  BP: 134/89 127/66  Pulse: 75 95  Resp: 16   Temp: (!) 36.4 C (!) 36.4 C  SpO2: 96% 94%    Last Pain:  Vitals:   10/24/18 1502  TempSrc:   PainSc: 7                  Lewie Loron

## 2018-10-24 NOTE — Anesthesia Postprocedure Evaluation (Signed)
Anesthesia Post Note  Patient: Ethan Williams  Procedure(s) Performed: ANTERIOR CERVICAL DECOMPRESSION FUSION, CERVICAL 5-6, CERVICAL 6-7 , WITH POSSIBLE C6-7 CORPECTOMY (N/A Neck)     Patient location during evaluation: PACU Anesthesia Type: General Level of consciousness: awake and alert Pain management: pain level controlled Vital Signs Assessment: post-procedure vital signs reviewed and stable Respiratory status: spontaneous breathing, nonlabored ventilation, respiratory function stable and patient connected to nasal cannula oxygen Cardiovascular status: blood pressure returned to baseline and stable Postop Assessment: no apparent nausea or vomiting Anesthetic complications: no    Last Vitals:  Vitals:   10/24/18 1246 10/24/18 1439  BP: 134/89 127/66  Pulse: 75 95  Resp: 16   Temp: (!) 36.4 C (!) 36.4 C  SpO2: 96% 94%    Last Pain:  Vitals:   10/24/18 1502  TempSrc:   PainSc: 7                  Lynleigh Kovack L Alizabeth Antonio

## 2018-10-24 NOTE — Anesthesia Procedure Notes (Signed)
Procedure Name: Intubation Date/Time: 10/24/2018 7:41 AM Performed by: Epifanio Lesches, CRNA Pre-anesthesia Checklist: Patient identified, Emergency Drugs available, Suction available and Patient being monitored Patient Re-evaluated:Patient Re-evaluated prior to induction Oxygen Delivery Method: Circle system utilized Preoxygenation: Pre-oxygenation with 100% oxygen Induction Type: IV induction Ventilation: Two handed mask ventilation required Laryngoscope Size: Glidescope and 4 Grade View: Grade I Tube type: Oral Tube size: 7.5 mm Number of attempts: 1 Airway Equipment and Method: Stylet Placement Confirmation: ETT inserted through vocal cords under direct vision,  positive ETCO2,  CO2 detector and breath sounds checked- equal and bilateral Secured at: 23 (lip) cm Dental Injury: Teeth and Oropharynx as per pre-operative assessment  Comments: Performed by Edmonia Lynch

## 2018-10-24 NOTE — H&P (Signed)
Patient tolerated stage 1 of his surgery yesterday well. He now present for stage 2, a posterior cervical fusion C5-C7. Will proceed as planned.

## 2018-10-24 NOTE — Op Note (Signed)
PATIENT NAME: Ethan Williams   MEDICAL RECORD NO.:   [id number]    PHYSICIAN:  Estill Bamberg, MD      DATE OF BIRTH: 1961-04-07   DATE OF PROCEDURE: 10/24/18       OPERATIVE REPORT   PREOPERATIVE DIAGNOSES: 1.  Status post previous C5-C7 anterior cervical diskectomy and C6 corpectomy, requiring posterior instrumentation with fusion and decompression   POSTOPERATIVE DIAGNOSES: 1.  Status post previous C5-C7 anterior cervical diskectomy and C6 corpectomy, requiring posterior instrumentation with fusion and decompression  PROCEDURE (stage 2 of 2): 1.  Posterior spinal fusion C5-C6, C6-7. 2.  Placement of posterior segmental instrumentation C5-C7. 3.  Bilateral foraminotomies and partial facetectomies, C6-7 3.  Use of local autograft. 4.  Use of morselized allograft - Vivigen and DBX putty. 5.  Cranial tong application and removal. 6.  Intraoperative use of fluoroscopy.  SURGEON:  Estill Bamberg, MD  ASSISTANT:  Jason Coop, PA-C   ANESTHESIA:  General endotracheal anesthesia.  COMPLICATIONS:  None.  DISPOSITION:  Stable.  ESTIMATED BLOOD LOSS:  Minimal.  INDICATIONS FOR SURGERY:  Briefly, the patient is a very pleasant 58 year old male who is status post the surgery as noted above.  The patient did present today for stage 2 of his procedure, specifically, a posterior fusion with instrumentation, with bilateral partial facetectomies and foraminotomies at C6-7.  OPERATIVE DETAILS:  On 10/24/18, the patient was brought to surgery and general endotracheal anesthesia was administered.  A Mayfield head holder was applied.  The patient was then rolled prone, and the Mayfield headholder was secured to the bed with the head positioned into the appropriate degree of lordosis.  The patient's arms were secured to his sides, and all bony prominences were padded.  The neck was then prepped and draped posteriorly in the usual fashion.  A timeout procedure was performed.  At this point, I did  make a midline incision.  The fascia was incised at the midline.  The paraspinal musculature was bluntly swept laterally, and the posterior elements of C5, C6, and C7 were identified and subperiosteally exposed.  The facet joints to be fused were subperiosteally exposed as well.  I did use a 1.7 mm high-speed burr to decorticate the facet joints at C5-6 and C6-7 bilaterally.  A 3 mm bur was used to decorticate the posterior elements across these levels as well.  Using anatomic landmarks, I did use a 1.7 mm bur to prepare the entry point of the lateral mass screws at C5 bilaterally.  A 3 mm tap was used, and 3.5 x 14 mm screws were advanced into the lateral masses of C5 bilaterally.  Then, bilaterally, I performed partial facetectomies at C6-7.  This did entirely and thoroughly decompress the foramen on the right and left sides at C6-7.  Then, using a nerve hook, I was able to palpate the medial and superior borders of the C7 pedicles.  This did allow me to use a 1.7 mm bur, followed by a gearshift probe, followed by a 3 mm tap, to cannulate the C7 pedicles bilaterally.  I then advanced 22 x 3.5 mm pedicle screws into the C7 pedicles bilaterally.  Of note, a ball-tipped probe was used to confirm that there is no cortical violation of the taps prior to placing the pedicle screws.  No cortical violation was noted.  At this point, DBX putty was placed into the facet joints at C5-6 and C6-7 bilaterally.  Vivigen was then packed along the posterior elements spanning C5, C6, and  C7.  A rod of the appropriate length was then secured bilaterally into the tulip heads of the screws.  Caps were placed in a final locking procedure was performed.  At the termination of the procedure, I did obtain AP and lateral fluoroscopic images, and was very pleased with the appearance of the images.  The wound was copiously irrigated with approximately 2 L of normal saline prior to placing the bone graft. At this point, the wound was closed  in layers using #1 Vicryl, followed by 2-0 Vicryl, followed by 4-0 Monocryl.  Benzoin and Steri-Strips  were applied, followed by sterile dressing.  All instrument counts were correct at the termination of the procedure.  Of note, Jason Coop was my assistant throughout surgery and did aid in retraction, suctioning, and closure from start to finish.   Estill Bamberg, MD

## 2018-10-24 NOTE — Transfer of Care (Signed)
Immediate Anesthesia Transfer of Care Note  Patient: Ethan Williams  Procedure(s) Performed: CERVICAL FIVE-SEVEN POSTERIOR SPINAL FUSION WITH INSTRUMENTATION AND ALLOGRAFT (Bilateral )  Patient Location: PACU  Anesthesia Type:General  Level of Consciousness: awake and alert   Airway & Oxygen Therapy: Patient Spontanous Breathing and Patient connected to nasal cannula oxygen  Post-op Assessment: Report given to RN and Post -op Vital signs reviewed and stable  Post vital signs: Reviewed and stable  Last Vitals:  Vitals Value Taken Time  BP 145/85 10/24/2018 11:07 AM  Temp    Pulse 97 10/24/2018 11:10 AM  Resp 16 10/24/2018 11:10 AM  SpO2 98 % 10/24/2018 11:10 AM  Vitals shown include unvalidated device data.  Last Pain:  Vitals:   10/24/18 0437  TempSrc:   PainSc: 3       Patients Stated Pain Goal: 3 (10/24/18 0344)  Complications: No apparent anesthesia complications

## 2018-10-24 NOTE — Progress Notes (Signed)
.  WRONG TIME

## 2018-10-25 NOTE — Progress Notes (Signed)
    Patient doing well  Denies arm pain Has been tolerating PO well Has been ambulating   Physical Exam: Vitals:   10/25/18 0339 10/25/18 0712  BP: 138/72 136/76  Pulse: 79 84  Resp: 20 19  Temp: 98.5 F (36.9 C)   SpO2: 94% 93%    Dressing in place NVI  POD s/p A/P C5-C7 fusion, doing well  - encourage ambulation - Percocet for pain, Valium for muscle spasms - d/c home today with f/u in 2 weeks

## 2018-10-25 NOTE — Progress Notes (Signed)
Patient is discharged from room 3C04 at this time. Alert and in stable condition. IV site d/c'd and instructions read to patient and father with understanding verbalized. Left unit via wheelchair with all belongings at side.

## 2018-10-27 ENCOUNTER — Encounter (HOSPITAL_COMMUNITY): Payer: Self-pay | Admitting: Orthopedic Surgery

## 2018-10-29 MED FILL — Sodium Chloride IV Soln 0.9%: INTRAVENOUS | Qty: 1000 | Status: AC

## 2018-10-29 MED FILL — Heparin Sodium (Porcine) Inj 1000 Unit/ML: INTRAMUSCULAR | Qty: 30 | Status: AC

## 2018-10-30 NOTE — Discharge Summary (Signed)
Patient ID: Ethan Williams MRN: [id number] DOB/AGE: 1960-10-28 58 y.o.  Admit date: 10/23/2018 Discharge date: 10/24/2018  Admission Diagnoses:  Active Problems:   Radiculopathy   Discharge Diagnoses:  Same  Past Medical History:  Diagnosis Date  . Anginal pain (HCC)   . Arthritis    "right shoulder" (06/06/2012), "finger joints"  . Daily headache    "here lately they've been daily" (06/06/2012)  . Dysrhythmia    "palpitations" (06/06/2012)  . External hemorrhoid, bleeding   . GERD (gastroesophageal reflux disease)   . Heart murmur    "as a child" (06/06/2012)  . Migraines    "treated for them in the last 6 months (06/06/2012)  . Pneumonia 2012; 06/06/2012  . Seasonal allergies    "outdoor; pollen, grass" (06/06/2012)  . Shortness of breath 06/06/2012   "at rest; lying down; w/exertion"    Surgeries: Procedure(s): CERVICAL FIVE-SEVEN POSTERIOR SPINAL FUSION WITH INSTRUMENTATION AND ALLOGRAFT on 10/24/2018   Consultants: None  Discharged Condition: Improved  Hospital Course: Chiron Campione is an 58 y.o. male who was admitted 10/23/2018 for operative treatment of radiculopathy. Patient has severe unremitting pain that affects sleep, daily activities, and work/hobbies. After pre-op clearance the patient was taken to the operating room on 10/24/2018 and underwent  Procedure(s): CERVICAL FIVE-SEVEN POSTERIOR SPINAL FUSION WITH INSTRUMENTATION AND ALLOGRAFT.    Patient was given perioperative antibiotics:  Anti-infectives (From admission, onward)   Start     Dose/Rate Route Frequency Ordered Stop   10/23/18 1600  ceFAZolin (ANCEF) IVPB 2g/100 mL premix     2 g 200 mL/hr over 30 Minutes Intravenous Every 8 hours 10/23/18 1404 10/24/18 1314   10/23/18 0700  ceFAZolin (ANCEF) IVPB 2g/100 mL premix     2 g 200 mL/hr over 30 Minutes Intravenous On call to O.R. 10/23/18 4098 10/23/18 0845   10/23/18 0651  ceFAZolin (ANCEF) 2-4 GM/100ML-% IVPB    Note to Pharmacy:  Kathrene Bongo   : cabinet override      10/23/18 0651 10/23/18 1859       Patient was given sequential compression devices, early ambulation to prevent DVT.  Patient benefited maximally from hospital stay and there were no complications.    Recent vital signs: BP 130/79 (BP Location: Left Arm)   Pulse 90   Temp 98.7 F (37.1 C) (Oral)   Resp 19   Ht 6' (1.829 m)   Wt 90.7 kg   SpO2 95%   BMI 27.12 kg/m    Discharge Medications:   Allergies as of 10/25/2018   No Known Allergies     Medication List    STOP taking these medications   acetaminophen 500 MG tablet Commonly known as:  TYLENOL     TAKE these medications   calcium carbonate 500 MG chewable tablet Commonly known as:  TUMS - dosed in mg elemental calcium Chew 1 tablet by mouth daily as needed for indigestion or heartburn.   CLEAR EYES FOR DRY EYES 1-0.25 % Soln Generic drug:  Carboxymethylcellul-Glycerin Apply 1 drop to eye daily as needed (dry eyes).   diazepam 5 MG tablet Commonly known as:  VALIUM Take 1 tablet (5 mg total) by mouth every 6 (six) hours as needed for muscle spasms.   famotidine 20 MG tablet Commonly known as:  PEPCID Take 1 tablet (20 mg total) by mouth 2 (two) times daily.   gabapentin 300 MG capsule Commonly known as:  NEURONTIN TAKE 1 CAPSULE BY MOUTH EVERY NIGHT AT BEDTIME  multivitamin with minerals Tabs tablet Take 1 tablet by mouth daily.   oxyCODONE-acetaminophen 5-325 MG tablet Commonly known as:  PERCOCET/ROXICET Take 1-2 tablets by mouth every 4 (four) hours as needed for moderate pain or severe pain.   tamsulosin 0.4 MG Caps capsule Commonly known as:  FLOMAX Take 1 capsule (0.4 mg total) by mouth daily.       Diagnostic Studies: Dg Cervical Spine 1 View  Result Date: 10/24/2018 CLINICAL DATA:  Cervical spine surgery, fusion C5-C7 EXAM: DG CERVICAL SPINE - 1 VIEW COMPARISON:  Earlier study of 10/23/2018 FINDINGS: Metallic probe via dorsal approach is identified with  the tip projecting at the posterior aspect of the C5-C6 disc space level. Anterior cervical fusion with plate and screws at C5 to at least C7, inferior extent of hardware and fusion not visualized due to superimposition of the shoulders. Strut graft present at C6 into C7, inferior extent not visualized. Osseous mineralization normal. No subluxation. IMPRESSION: Posterior metallic probe localizing the dorsal aspect of the C5-C6 disc space level. Post anterior fusion of the lower cervical spine, C5-C7 as above. Electronically Signed   By: Ulyses Southward M.D.   On: 10/24/2018 10:10   Dg Cervical Spine 1 View  Result Date: 10/23/2018 CLINICAL DATA:  C5-C7 ACDF with C6 corpectomy. EXAM: DG C-ARM 61-120 MIN; DG CERVICAL SPINE - 1 VIEW FLUOROSCOPY TIME:  28 seconds COMPARISON:  Cervical spine MRI-08/19/2018 FINDINGS: Single spot lateral projection fluoroscopic image of the cervical spine is provided for review. Provided image demonstrates the sequela of ACDF and corpectomy though spinal labeling is degraded secondary to exclusion of the cervicothoracic junction. Alignment appears anatomic. Endotracheal and enteric tube tips terminate below the field of view. Radiopaque surgical support apparatus is seen anterior to the operative site. IMPRESSION: Post ACDF and corpectomy as detailed above. Electronically Signed   By: Simonne Come M.D.   On: 10/23/2018 13:49   Dg Cervical Spine 2-3 Views  Result Date: 10/24/2018 CLINICAL DATA:  Status post C6 corpectomy and anterior C5-7 fusion on 10/23/2018. EXAM: DG C-ARM 61-120 MIN; CERVICAL SPINE - 2-3 VIEW COMPARISON:  Yesterday. FINDINGS: One PA and 3 lateral C-arm views of the cervical spine are submitted for interpretation. The patient's shoulders are obscuring the cervicothoracic region. Post C6 corpectomy changes are again demonstrated with anterior screw and plate fusion at the C5-7 levels. Interval posterior screw and rod fixation at those levels, not well visualized  inferiorly. IMPRESSION: Operative changes with interval posterior screw and rod fusion at the C5-7 levels, as described above. This is not adequately visualized and routine views, including a swimmer's view, are recommended when possible. Electronically Signed   By: Beckie Salts M.D.   On: 10/24/2018 11:10   Dg C-arm 1-60 Min  Result Date: 10/24/2018 CLINICAL DATA:  Status post C6 corpectomy and anterior C5-7 fusion on 10/23/2018. EXAM: DG C-ARM 61-120 MIN; CERVICAL SPINE - 2-3 VIEW COMPARISON:  Yesterday. FINDINGS: One PA and 3 lateral C-arm views of the cervical spine are submitted for interpretation. The patient's shoulders are obscuring the cervicothoracic region. Post C6 corpectomy changes are again demonstrated with anterior screw and plate fusion at the C5-7 levels. Interval posterior screw and rod fixation at those levels, not well visualized inferiorly. IMPRESSION: Operative changes with interval posterior screw and rod fusion at the C5-7 levels, as described above. This is not adequately visualized and routine views, including a swimmer's view, are recommended when possible. Electronically Signed   By: Beckie Salts M.D.   On:  10/24/2018 11:10   Dg C-arm 1-60 Min  Result Date: 10/23/2018 CLINICAL DATA:  C5-C7 ACDF with C6 corpectomy. EXAM: DG C-ARM 61-120 MIN; DG CERVICAL SPINE - 1 VIEW FLUOROSCOPY TIME:  28 seconds COMPARISON:  Cervical spine MRI-08/19/2018 FINDINGS: Single spot lateral projection fluoroscopic image of the cervical spine is provided for review. Provided image demonstrates the sequela of ACDF and corpectomy though spinal labeling is degraded secondary to exclusion of the cervicothoracic junction. Alignment appears anatomic. Endotracheal and enteric tube tips terminate below the field of view. Radiopaque surgical support apparatus is seen anterior to the operative site. IMPRESSION: Post ACDF and corpectomy as detailed above. Electronically Signed   By: Simonne Come M.D.   On:  10/23/2018 13:49    Disposition:    POD s/p A/P C5-C7 fusion, doing well  - encourage ambulation - Percocet for pain, Valium for muscle spasms - Scripts for pain sent electronically to pharmacy -D/C instructions sheet printed and in chart -D/C today  -F/U in office 2 weeks   Signed: Eilene Ghazi Darris Carachure 10/30/2018, 11:23 AM

## 2018-12-23 ENCOUNTER — Other Ambulatory Visit: Payer: Self-pay

## 2018-12-23 ENCOUNTER — Encounter: Payer: Self-pay | Admitting: Physical Therapy

## 2018-12-23 ENCOUNTER — Ambulatory Visit: Payer: Medicaid Other | Attending: Orthopedic Surgery | Admitting: Physical Therapy

## 2018-12-23 DIAGNOSIS — M542 Cervicalgia: Secondary | ICD-10-CM | POA: Diagnosis not present

## 2018-12-23 NOTE — Patient Instructions (Signed)
Access Code: [email or web address]  URL: [email or web address]  Date: 12/23/2018  Prepared by: Ivery Quale   Exercises  Seated Levator Scapulae Stretch - 3 sets - 30 hold - 2x daily - 6x weekly  Seated Cervical Sidebending Stretch - 3 sets - 30 hold - 2x daily - 6x weekly  Seated Cervical Retraction - 10 reps - 2-3 sets - 2x daily - 6x weekly  Standing Cervical Rotation AROM with Overpressure - 10 reps - 1-2 sets - 2x daily - 6x weekly  Seated Isometric Cervical Extension - 10 reps - 1 sets - 6 hold - 2x daily - 6x weekly  Seated Isometric Cervical Flexion - 10 reps - 1 sets - 6 hold - 2x daily - 6x weekly  Seated Isometric Cervical Rotation - 10 reps - 1 sets - 6 hold - 2x daily - 6x weekly  Standing Isometric Cervical Sidebending with Manual Resistance - 10 reps - 1 sets - 6 hold - 2x daily - 6x weekly  Standing Row with Resistance - 10 reps - 2-3 sets - 2x daily - 6x weekly  Standing Elbow Extension with Anchored Resistance - 10 reps - 3 sets - 2x daily - 6x weekly  Standing Shoulder Horizontal Abduction with Resistance - 10 reps - 3 sets - 2x daily - 6x weekly

## 2018-12-23 NOTE — Therapy (Signed)
[facility] [address] [place], [place], [address] Phone: [phone]   Fax:  [phone]  Physical Therapy Evaluation  Patient Details  Name: Ethan Williams MRN: [id number] Date of Birth: 02/14/1961 Referring Provider (PT): Estill Bamberg, MD   Encounter Date: 12/23/2018  PT End of Session - 12/23/18 1456    Visit Number  1    Number of Visits  4    Date for PT Re-Evaluation  01/20/19    Authorization Type  MCD    PT Start Time  1235    PT Stop Time  1317    PT Time Calculation (min)  42 min    Activity Tolerance  Patient tolerated treatment well       Past Medical History:  Diagnosis Date  . Anginal pain (HCC)   . Arthritis    "right shoulder" (06/06/2012), "finger joints"  . Daily headache    "here lately they've been daily" (06/06/2012)  . Dysrhythmia    "palpitations" (06/06/2012)  . External hemorrhoid, bleeding   . GERD (gastroesophageal reflux disease)   . Heart murmur    "as a child" (06/06/2012)  . Migraines    "treated for them in the last 6 months (06/06/2012)  . Pneumonia 2012; 06/06/2012  . Seasonal allergies    "outdoor; pollen, grass" (06/06/2012)  . Shortness of breath 06/06/2012   "at rest; lying down; w/exertion"    Past Surgical History:  Procedure Laterality Date  . ANTERIOR CERVICAL DECOMP/DISCECTOMY FUSION N/A 10/23/2018   Procedure: ANTERIOR CERVICAL DECOMPRESSION FUSION, CERVICAL 5-6, CERVICAL 6-7 , WITH POSSIBLE C6-7 CORPECTOMY;  Surgeon: Estill Bamberg, MD;  Location: [facility] OR;  Service: Orthopedics;  Laterality: N/A;  . APPENDECTOMY  2001  . POSTERIOR CERVICAL FUSION/FORAMINOTOMY Bilateral 10/24/2018   Procedure: CERVICAL FIVE-SEVEN POSTERIOR SPINAL FUSION WITH INSTRUMENTATION AND ALLOGRAFT;  Surgeon: Estill Bamberg, MD;  Location: [facility] OR;  Service: Orthopedics;  Laterality: Bilateral;  . SHOULDER SURGERY Right     There were no vitals filed for this visit.   Subjective Assessment -  12/23/18 1241    Subjective  Pt relays his neck is doing okay after his fusion on 10/24/18 but still having some N/T in his index and middle finger or left hand. He says he has weakness.     Pertinent History  PMH: migranes, SOB, Rt shoulder hemiarthroplaty 2014.    Limitations  Reading;Lifting;House hold activities    Currently in Pain?  Yes    Pain Score  3     Pain Location  Neck    Pain Orientation  Anterior;Posterior    Pain Descriptors / Indicators  Aching;Burning    Pain Type  Surgical pain    Pain Radiating Towards  intermittent down Left arm    Pain Onset  More than a month ago    Pain Frequency  Intermittent    Aggravating Factors   turning his head, looking, up, carrying    Pain Relieving Factors  having a headrest    Multiple Pain Sites  No         [facility] PT Assessment - 12/23/18 0001      Assessment   Medical Diagnosis  Neck pain and stiffness (S/P C5-7 ACDF/PSF    Referring Provider (PT)  Estill Bamberg, MD    Onset Date/Surgical Date  10/24/18    Hand Dominance  Right    Next MD Visit  next month    Prior Therapy  for shoulder 5 years ago  Precautions   Precautions  None    Precaution Comments  none      Balance Screen   Has the patient fallen in the past 6 months  No      Home Environment   Living Environment  Private residence      Prior Function   Level of Independence  Independent with basic ADLs      Cognition   Overall Cognitive Status  Within Functional Limits for tasks assessed      Sensation   Light Touch  Appears Intact      Coordination   Gross Motor Movements are Fluid and Coordinated  Yes      ROM / Strength   AROM / PROM / Strength  AROM;Strength      AROM   AROM Assessment Site  Cervical    Cervical Flexion  25    Cervical Extension  30    Cervical - Right Side Bend  10    Cervical - Left Side Bend  5    Cervical - Right Rotation  55    Cervical - Left Rotation  50      Strength   Overall Strength Comments  UE strength  overall 4+/5 MMT grossly tested in sitting, WNL grip strength      Flexibility   Soft Tissue Assessment /Muscle Length  --   tight cervical P.S, cervical flexors, traps               Objective measurements completed on examination: See above findings.      [facility] Adult PT Treatment/Exercise - 12/23/18 0001      Modalities   Modalities  Moist Heat      Moist Heat Therapy   Number Minutes Moist Heat  8 Minutes    Moist Heat Location  Cervical      Manual Therapy   Manual therapy comments  gentle PROM to neck all planes             PT Education - 12/23/18 1455    Education Details  HEP, POC, use heat before stretching    Person(s) Educated  Patient    Methods  Explanation;Demonstration;Handout;Verbal cues    Comprehension  Verbalized understanding;Need further instruction          PT Long Term Goals - 12/23/18 1503      PT LONG TERM GOAL #1   Title  Pt will be I and compliant with HEP (4 weeks 01/20/19)    Baseline  no HEP until today.     Status  New      PT LONG TERM GOAL #2   Title  Pt will report overall less than 2/10 pain with usual activity and ADL's. (4 weeks 01/20/19)    Baseline  3/10 pain with modified ADL's    Status  New      PT LONG TERM GOAL #3   Title  [facility]     Baseline  ROM limited 50% all planes but sidebending limited >75% limitation. (4 weeks 01/20/19)    Status  New      PT LONG TERM GOAL #4   Title  Pt will have WNL UE strength for ADL's. (4 weeks 01/20/19)    Baseline  4+/5 MMT UE grossly    Status  New             Plan - 12/23/18 1457    Clinical Impression Statement  Pt presents with neck  pain and stiffness (S/P C5-7 ACDF/PSF) DOS 10/24/18. He is doing overall fairly well but ROM is his biggest defecit. He has decreased ROM, decreased overall UE strength and endurance, decreased functional abilites and increased pain. He will benefit from skilled PT to address his defecits.     Personal  Factors and Comorbidities  Comorbidity 1;Comorbidity 2    Comorbidities  PMH: migranes, SOB, Rt shoulder hemiarthroplaty 2014.    Examination-Activity Limitations  Reach Overhead;Lift;Carry;Sleep    Examination-Participation Restrictions  Cleaning;Community Activity;Driving;Yard Work    Conservation officer, historic buildings  Evolving/Moderate complexity    Clinical Decision Making  Moderate    Rehab Potential  Good    PT Frequency  1x / week    PT Duration  4 weeks    PT Treatment/Interventions  Cryotherapy;Electrical Stimulation;Iontophoresis /ml Dexamethasone;Moist Heat;Therapeutic activities;Therapeutic exercise;Neuromuscular re-education;Passive range of motion;Dry needling;Taping    PT Next Visit Plan  S/P fusion so no mechanical traction or spinal manipulation, he needs neck ROM and UE strength    PT Home Exercise Plan  neck AAROM, neck isometrics, rows, ext, UT stretch, levator stretch, neck retractions    Consulted and Agree with Plan of Care  Patient       Patient will benefit from skilled therapeutic intervention in order to improve the following deficits and impairments:  Decreased activity tolerance, Decreased endurance, Decreased range of motion, Decreased strength, Hypomobility, Impaired flexibility, Increased fascial restricitons, Pain, Improper body mechanics, Postural dysfunction  Visit Diagnosis: Cervicalgia     Problem List Patient Active Problem List   Diagnosis Date Noted  . Radiculopathy 10/23/2018  . Chronic left shoulder pain 07/24/2018  . Benign prostatic hyperplasia (BPH) with straining on urination 06/29/2017  . Urinary incontinence 11/08/2016  . Abnormal vision 03/16/2013  . Erectile dysfunction 01/06/2013  . Hemorrhoids 01/06/2013  . Tobacco dependence 06/05/2011  . Dental caries 05/22/2011  . Glenohumeral arthritis 02/22/2011    Birdie Riddle 12/23/2018, 3:07 PM  [facility] [address] [place], [place], [address] Phone: [phone]   Fax:  [phone]  Name: Nathanuel Cabreja MRN: [id number] Date of Birth: 06-Nov-1960

## 2019-01-01 ENCOUNTER — Other Ambulatory Visit: Payer: Self-pay

## 2019-01-01 ENCOUNTER — Ambulatory Visit: Payer: Medicaid Other | Admitting: Physical Therapy

## 2019-01-01 DIAGNOSIS — M542 Cervicalgia: Secondary | ICD-10-CM | POA: Diagnosis not present

## 2019-01-01 NOTE — Therapy (Signed)
[facility] Outpatient Rehabilitation [facility] [address] [place], [place], [address] Phone: [phone]   Fax:  [phone]  Physical Therapy Treatment  Patient Details  Name: Ethan Williams MRN: [id number] Date of Birth: May 23, 1961 Referring Provider (PT): Estill Bamberg, MD   Encounter Date: 01/01/2019  PT End of Session - 01/01/19 1231    Visit Number  2    Number of Visits  4    Date for PT Re-Evaluation  01/20/19    Authorization Type  MCD    PT Start Time  1145    PT Stop Time  1225   10 min on heat    PT Time Calculation (min)  40 min    Activity Tolerance  Patient tolerated treatment well       Past Medical History:  Diagnosis Date  . Anginal pain (HCC)   . Arthritis    "right shoulder" (06/06/2012), "finger joints"  . Daily headache    "here lately they've been daily" (06/06/2012)  . Dysrhythmia    "palpitations" (06/06/2012)  . External hemorrhoid, bleeding   . GERD (gastroesophageal reflux disease)   . Heart murmur    "as a child" (06/06/2012)  . Migraines    "treated for them in the last 6 months (06/06/2012)  . Pneumonia 2012; 06/06/2012  . Seasonal allergies    "outdoor; pollen, grass" (06/06/2012)  . Shortness of breath 06/06/2012   "at rest; lying down; w/exertion"    Past Surgical History:  Procedure Laterality Date  . ANTERIOR CERVICAL DECOMP/DISCECTOMY FUSION N/A 10/23/2018   Procedure: ANTERIOR CERVICAL DECOMPRESSION FUSION, CERVICAL 5-6, CERVICAL 6-7 , WITH POSSIBLE C6-7 CORPECTOMY;  Surgeon: Estill Bamberg, MD;  Location: [facility] OR;  Service: Orthopedics;  Laterality: N/A;  . APPENDECTOMY  2001  . POSTERIOR CERVICAL FUSION/FORAMINOTOMY Bilateral 10/24/2018   Procedure: CERVICAL FIVE-SEVEN POSTERIOR SPINAL FUSION WITH INSTRUMENTATION AND ALLOGRAFT;  Surgeon: Estill Bamberg, MD;  Location: [facility] OR;  Service: Orthopedics;  Laterality: Bilateral;  . SHOULDER SURGERY Right     There were no vitals filed for this visit.  Subjective  Assessment - 01/01/19 1211    Subjective  Pt relays his neck is a little better and he relays he has been working on his HEP    Currently in Pain?  Yes    Pain Score  2     Pain Location  Neck    Pain Descriptors / Indicators  Aching;Tightness    Pain Type  Surgical pain    Pain Onset  More than a month ago    Pain Frequency  Intermittent         [facility] PT Assessment - 01/01/19 0001      Assessment   Medical Diagnosis  Neck pain and stiffness (S/P C5-7 ACDF/PSF    Referring Provider (PT)  Estill Bamberg, MD    Onset Date/Surgical Date  10/24/18    Hand Dominance  Right      AROM   AROM Assessment Site  Cervical    Cervical Flexion  35    Cervical Extension  33    Cervical - Right Side Bend  20    Cervical - Left Side Bend  15    Cervical - Right Rotation  65    Cervical - Left Rotation  70                   [facility] Adult PT Treatment/Exercise - 01/01/19 0001      Exercises   Exercises  Neck  Neck Exercises: Machines for Strengthening   UBE (Upper Arm Bike)  2 min fwd 2 min back no resistance      Neck Exercises: Seated   Cervical Isometrics  5 secs;Flexion;Extension;Right lateral flexion;Left lateral flexion;Right rotation;Left rotation;5 reps    Neck Retraction  20 reps      Modalities   Modalities  Moist Heat      Moist Heat Therapy   Number Minutes Moist Heat  10 Minutes    Moist Heat Location  Cervical   pre session     Manual Therapy   Manual therapy comments  gentle PROM to neck all planes, STM      Neck Exercises: Stretches   Upper Trapezius Stretch  Right;Left;2 reps;30 seconds    Levator Stretch  Right;Left;2 reps;30 seconds             PT Education - 01/01/19 1230    Education Details  HEP review, use heat before stretching     Person(s) Educated  Patient    Methods  Explanation;Demonstration;Verbal cues    Comprehension  Verbalized understanding;Returned demonstration          PT Long Term Goals - 01/01/19 1233       PT LONG TERM GOAL #1   Title  Pt will be I and compliant with HEP (4 weeks 01/20/19)    Baseline  no HEP until today.     Status  On-going      PT LONG TERM GOAL #2   Title  Pt will report overall less than 2/10 pain with usual activity and ADL's. (4 weeks 01/20/19)    Baseline  3/10 pain with modified ADL's    Status  On-going      PT LONG TERM GOAL #3   Title  [facility]     Baseline  ROM limited 50% all planes but sidebending limited >75% limitation. (4 weeks 01/20/19)    Status  On-going      PT LONG TERM GOAL #4   Title  Pt will have WNL UE strength for ADL's. (4 weeks 01/20/19)    Baseline  4+/5 MMT UE grossly    Status  On-going            Plan - 01/01/19 1231    Clinical Impression Statement  Pt showed good improvements with neck ROM since eval, see updated measurments. HEP was reviewed and he showed good understanding and demonstration. He will continue to benefit from PT.     Personal Factors and Comorbidities  Comorbidity 1;Comorbidity 2    Comorbidities  PMH: migranes, SOB, Rt shoulder hemiarthroplaty 2014.    Examination-Activity Limitations  Reach Overhead;Lift;Carry;Sleep    Examination-Participation Restrictions  Cleaning;Community Activity;Driving;Yard Work    Stability/Clinical Decision Making  Evolving/Moderate complexity    Rehab Potential  Good    PT Frequency  1x / week    PT Duration  4 weeks    PT Treatment/Interventions  Cryotherapy;Electrical Stimulation;Iontophoresis 4mg /ml Dexamethasone;Moist Heat;Therapeutic activities;Therapeutic exercise;Neuromuscular re-education;Passive range of motion;Dry needling;Taping    PT Next Visit Plan  S/P fusion so no mechanical traction or spinal manipulation, he needs neck ROM and UE strength    PT Home Exercise Plan  neck AAROM, neck isometrics, rows, ext, UT stretch, levator stretch, neck retractions    Consulted and Agree with Plan of Care  Patient       Patient will benefit from skilled  therapeutic intervention in order to improve the following deficits and impairments:  Decreased activity tolerance, Decreased endurance, Decreased range of motion, Decreased strength, Hypomobility, Impaired flexibility, Increased fascial restricitons, Pain, Improper body mechanics, Postural dysfunction  Visit Diagnosis: Cervicalgia     Problem List Patient Active Problem List   Diagnosis Date Noted  . Radiculopathy 10/23/2018  . Chronic left shoulder pain 07/24/2018  . Benign prostatic hyperplasia (BPH) with straining on urination 06/29/2017  . Urinary incontinence 11/08/2016  . Abnormal vision 03/16/2013  . Erectile dysfunction 01/06/2013  . Hemorrhoids 01/06/2013  . Tobacco dependence 06/05/2011  . Dental caries 05/22/2011  . Glenohumeral arthritis 02/22/2011    Birdie Riddle 01/01/2019, 12:33 PM  [facility] [address] [place], [place], [address] Phone: [phone]   Fax:  [phone]  Name: Ethan Williams MRN: [id number] Date of Birth: 1961/08/01

## 2019-01-02 IMAGING — DX DG CERVICAL SPINE COMPLETE 4+V
6 series · 6 of 6 positions shown · non-contrast
Comparison: Cervical spine series of April 16, 2017

CLINICAL DATA: One week history of left arm pain with tingling in
extending into the fingers as well as numbness. No known trauma.

EXAM:
CERVICAL SPINE - COMPLETE 4+ VIEW

[c-spine lat]
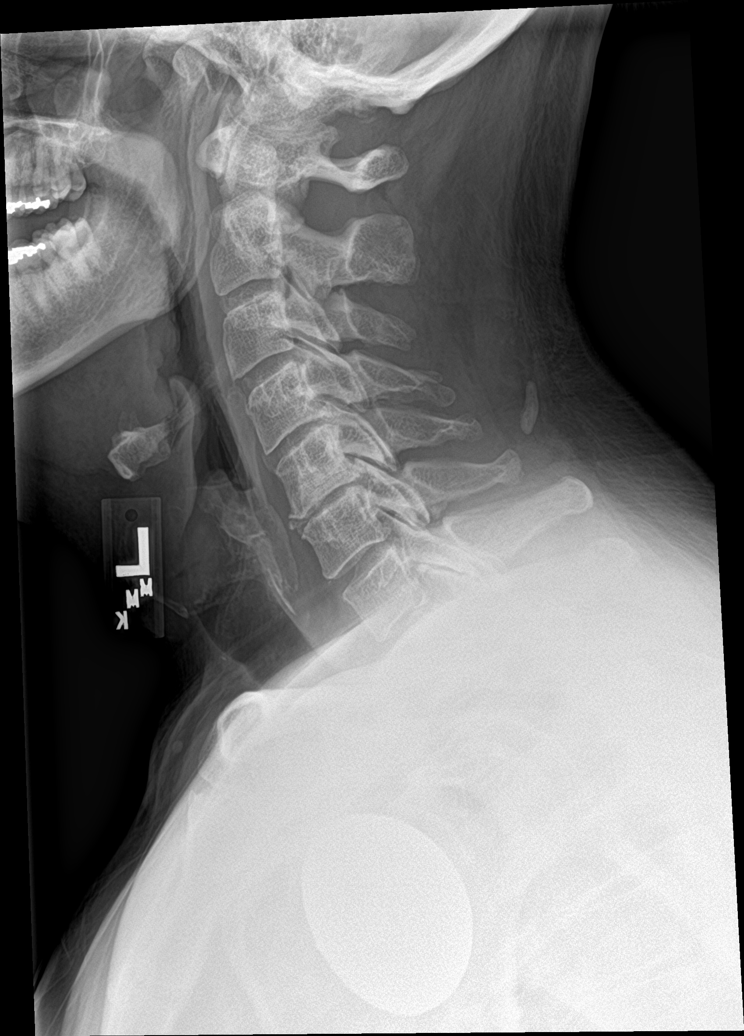

[c-spine obl (1 of 2)]
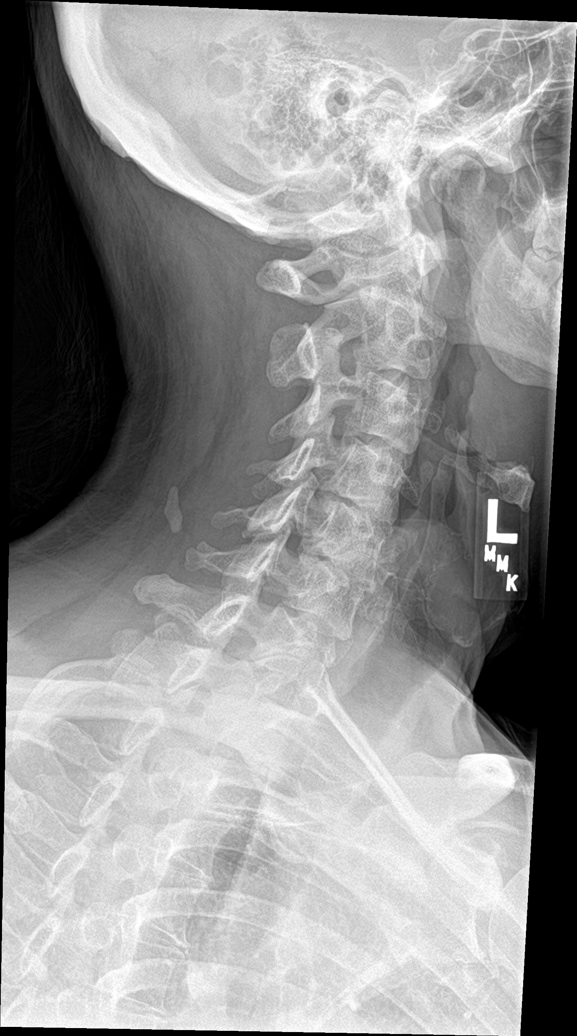

[c-spine obl (2 of 2)]
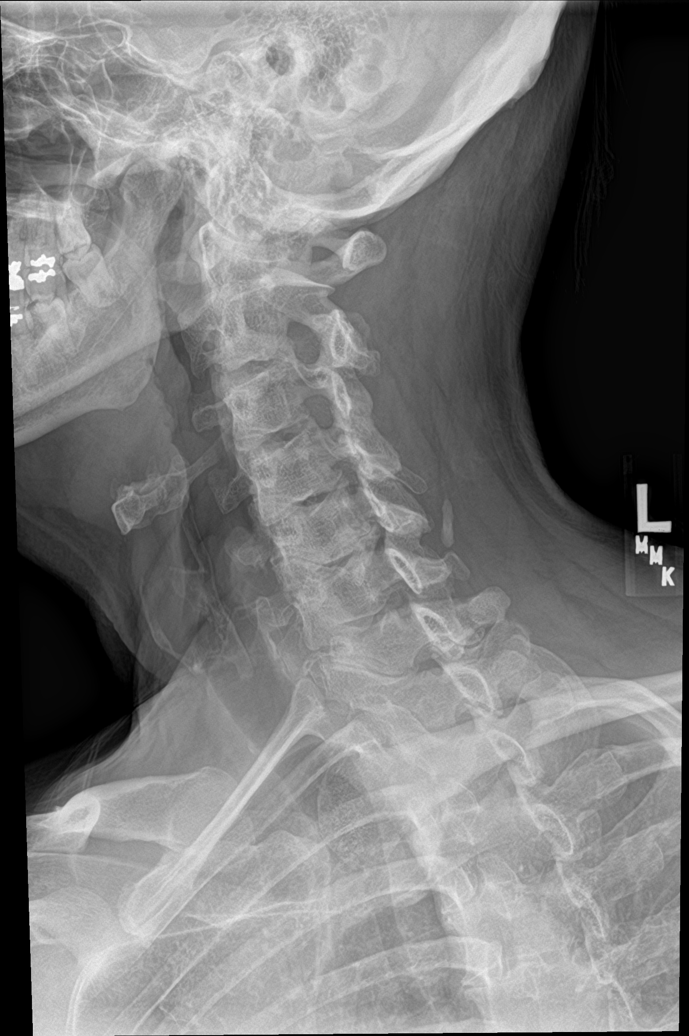

[c-spine ap]
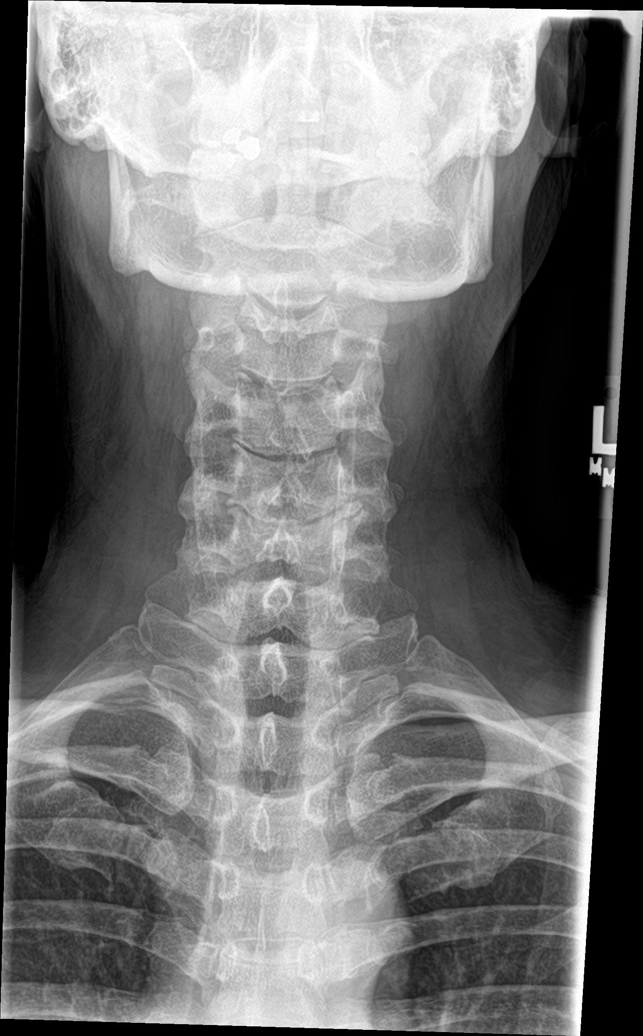

[c-spine open mouth]
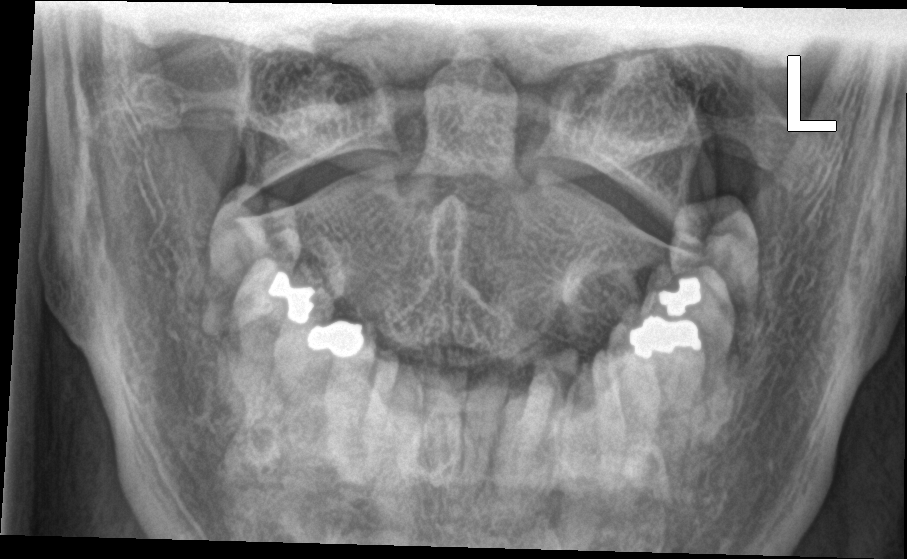

[c-spine swimmers]
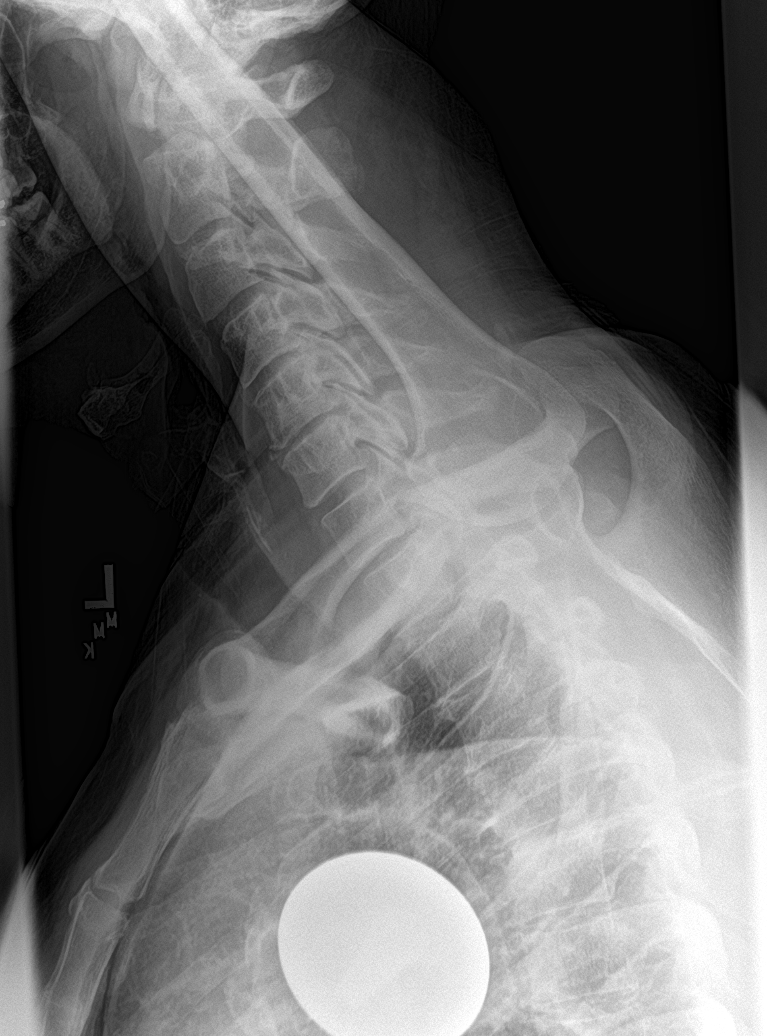

[6 of 6 positions shown; findings below may reference images not displayed]

FINDINGS: The cervical vertebral bodies are preserved in height. There is
moderate disc space narrowing at C5-6 and mild narrowing at C4-5.
There small anterior endplate osteophytes at C5-6. There is no
spondylolisthesis. There is no perched facet or spinous process
fracture. The oblique views reveal moderate bony encroachment upon
the neural foramina bilaterally at C 4 through C6. The prevertebral
soft tissue spaces are normal. There is calcification in the nuchal
ligament. The odontoid is intact.
IMPRESSION: Degenerative disc and facet joint change in the mid cervical spine
bilaterally. No compression fracture or spondylolisthesis.

## 2019-01-08 ENCOUNTER — Encounter: Payer: Self-pay | Admitting: Physical Therapy

## 2019-01-08 ENCOUNTER — Other Ambulatory Visit: Payer: Self-pay

## 2019-01-08 ENCOUNTER — Ambulatory Visit: Payer: Medicaid Other | Attending: Orthopedic Surgery | Admitting: Physical Therapy

## 2019-01-08 DIAGNOSIS — M542 Cervicalgia: Secondary | ICD-10-CM | POA: Diagnosis present

## 2019-01-08 NOTE — Therapy (Signed)
[facility] Outpatient Rehabilitation [facility] [address] [place], [place], [address] Phone: [phone]   Fax:  [phone]  Physical Therapy Treatment  Patient Details  Name: Ethan Williams MRN: [id number] Date of Birth: 1961-04-03 Referring Provider (PT): Estill Bamberg, MD   Encounter Date: 01/08/2019  PT End of Session - 01/08/19 1058    Visit Number  3    Number of Visits  4    Date for PT Re-Evaluation  01/20/19    Authorization Type  MCD (Resubmit at 4 visit)    Authorization Time Period  4/27 - 5/17,     Authorization - Visit Number  2    Authorization - Number of Visits  3    PT Start Time  1100    PT Stop Time  1141    PT Time Calculation (min)  41 min    Activity Tolerance  Patient tolerated treatment well    Behavior During Therapy  [facility] for tasks assessed/performed       Past Medical History:  Diagnosis Date  . Anginal pain (HCC)   . Arthritis    "right shoulder" (06/06/2012), "finger joints"  . Daily headache    "here lately they've been daily" (06/06/2012)  . Dysrhythmia    "palpitations" (06/06/2012)  . External hemorrhoid, bleeding   . GERD (gastroesophageal reflux disease)   . Heart murmur    "as a child" (06/06/2012)  . Migraines    "treated for them in the last 6 months (06/06/2012)  . Pneumonia 2012; 06/06/2012  . Seasonal allergies    "outdoor; pollen, grass" (06/06/2012)  . Shortness of breath 06/06/2012   "at rest; lying down; w/exertion"    Past Surgical History:  Procedure Laterality Date  . ANTERIOR CERVICAL DECOMP/DISCECTOMY FUSION N/A 10/23/2018   Procedure: ANTERIOR CERVICAL DECOMPRESSION FUSION, CERVICAL 5-6, CERVICAL 6-7 , WITH POSSIBLE C6-7 CORPECTOMY;  Surgeon: Estill Bamberg, MD;  Location: [facility] OR;  Service: Orthopedics;  Laterality: N/A;  . APPENDECTOMY  2001  . POSTERIOR CERVICAL FUSION/FORAMINOTOMY Bilateral 10/24/2018   Procedure: CERVICAL FIVE-SEVEN POSTERIOR SPINAL FUSION WITH INSTRUMENTATION AND ALLOGRAFT;   Surgeon: Estill Bamberg, MD;  Location: [facility] OR;  Service: Orthopedics;  Laterality: Bilateral;  . SHOULDER SURGERY Right     There were no vitals filed for this visit.  Subjective Assessment - 01/08/19 1103    Subjective  "I am doing the exercises daily, and only have pain at 1-2    Currently in Pain?  Yes    Pain Score  2     Pain Orientation  Right    Pain Descriptors / Indicators  Aching    Pain Type  Surgical pain    Pain Onset  More than a month ago    Pain Frequency  Intermittent    Aggravating Factors   turning his head and looking up    Pain Relieving Factors  having a headrest         [facility] PT Assessment - 01/08/19 0001      Assessment   Medical Diagnosis  Neck pain and stiffness (S/P C5-7 ACDF/PSF    Referring Provider (PT)  Estill Bamberg, MD                   [facility] Adult PT Treatment/Exercise - 01/08/19 0001      Self-Care   Self-Care  Other Self-Care Comments   how to perform MTPR using theracane     Exercises   Exercises  Shoulder      Neck  Exercises: Machines for Strengthening   UBE (Upper Arm Bike)  6 min L1   changing direction at 3 min     Shoulder Exercises: Supine   Horizontal ABduction  Both;Strengthening;15 reps;Theraband    Theraband Level (Shoulder Horizontal ABduction)  Level 2 (Red)    Other Supine Exercises  scapular retraction with bil ER 2 x 15 with red theraband      Shoulder Exercises: Seated   Other Seated Exercises  lower trap strengthening 2 x 12 with red theraband      Manual Therapy   Manual Therapy  Neural Stretch;Other (comment)    Manual therapy comments  PROM working into Rotation with gentle oscillations at end range    Other Manual Therapy  MPTR along bil upper trap / levator scapulae     Neural Stretch  Median nerve glides 2 x 10   updated HEP     Neck Exercises: Stretches   Upper Trapezius Stretch  Right;Left;2 reps;30 seconds    Levator Stretch  Right;Left;2 reps;30 seconds             PT  Education - 01/08/19 1144    Education Details  updated HEP for median nerve glides and how to perform MTPR    Person(s) Educated  Patient    Methods  Explanation;Verbal cues    Comprehension  Verbalized understanding;Verbal cues required          PT Long Term Goals - 01/01/19 1233      PT LONG TERM GOAL #1   Title  Pt will be I and compliant with HEP (4 weeks 01/20/19)    Baseline  no HEP until today.     Status  On-going      PT LONG TERM GOAL #2   Title  Pt will report overall less than 2/10 pain with usual activity and ADL's. (4 weeks 01/20/19)    Baseline  3/10 pain with modified ADL's    Status  On-going      PT LONG TERM GOAL #3   Title  Pt [facility]     Baseline  ROM limited 50% all planes but sidebending limited >75% limitation. (4 weeks 01/20/19)    Status  On-going      PT LONG TERM GOAL #4   Title  Pt will have WNL UE strength for ADL's. (4 weeks 01/20/19)    Baseline  4+/5 MMT UE grossly    Status  On-going            Plan - 01/08/19 1145    Clinical Impression Statement  pt noted consistency with his HEP. educated on how to perofrm MTPR and updated HEP for median nerve glides which he noted improvement of referral symptoms. continued strengthening which he responded to well but does fatigue quickly.    PT Treatment/Interventions  Cryotherapy;Electrical Stimulation;Iontophoresis 4mg /ml Dexamethasone;Moist Heat;Therapeutic activities;Therapeutic exercise;Neuromuscular re-education;Passive range of motion;Dry needling;Taping    PT Next Visit Plan  S/P fusion so no mechanical traction or spinal manipulation, he needs neck ROM and UE strength    PT Home Exercise Plan  neck AAROM, neck isometrics, rows, ext, UT stretch, levator stretch, neck retractions, median nerve glide       Patient will benefit from skilled therapeutic intervention in order to improve the following deficits and impairments:  Decreased activity tolerance, Decreased endurance,  Decreased range of motion, Decreased strength, Hypomobility, Impaired flexibility, Increased fascial restricitons, Pain, Improper body mechanics, Postural dysfunction  Visit Diagnosis: Cervicalgia  Problem List Patient Active Problem List   Diagnosis Date Noted  . Radiculopathy 10/23/2018  . Chronic left shoulder pain 07/24/2018  . Benign prostatic hyperplasia (BPH) with straining on urination 06/29/2017  . Urinary incontinence 11/08/2016  . Abnormal vision 03/16/2013  . Erectile dysfunction 01/06/2013  . Hemorrhoids 01/06/2013  . Tobacco dependence 06/05/2011  . Dental caries 05/22/2011  . Glenohumeral arthritis 02/22/2011   Lulu Riding PT, DPT, LAT, ATC  01/08/19  11:47 AM      [facility] Outpatient Rehabilitation [facility] [address] [place], [place], [address] Phone: [phone]   Fax:  [phone]  Name: Jolly Bleicher MRN: [id number] Date of Birth: [birth year]

## 2019-01-14 ENCOUNTER — Ambulatory Visit: Payer: Medicaid Other | Admitting: Physical Therapy

## 2019-01-14 ENCOUNTER — Other Ambulatory Visit: Payer: Self-pay

## 2019-01-14 DIAGNOSIS — M542 Cervicalgia: Secondary | ICD-10-CM | POA: Diagnosis not present

## 2019-01-14 NOTE — Therapy (Signed)
[place], [place], [address] Phone: [phone]   Fax:  [phone]  Physical Therapy Treatment/Discharge  Patient Details  Name: Ethan Williams MRN: [id number] Date of Birth: 1961-06-25 Referring Provider (PT): Phylliss Bob, MD   Encounter Date: 01/14/2019  PT End of Session - 01/14/19 1236    Visit Number  4    Number of Visits  4    Date for PT Re-Evaluation  01/20/19    Authorization Type  MCD    PT Start Time  1594    PT Stop Time  1228    PT Time Calculation (min)  43 min    Activity Tolerance  Patient tolerated treatment well       Past Medical History:  Diagnosis Date  . Anginal pain ([place])   . Arthritis    "right shoulder" (06/06/2012), "finger joints"  . Daily headache    "here lately they've been daily" (06/06/2012)  . Dysrhythmia    "palpitations" (06/06/2012)  . External hemorrhoid, bleeding   . GERD (gastroesophageal reflux disease)   . Heart murmur    "as a child" (06/06/2012)  . Migraines    "treated for them in the last 6 months (06/06/2012)  . Pneumonia 2012; 06/06/2012  . Seasonal allergies    "outdoor; pollen, grass" (06/06/2012)  . Shortness of breath 06/06/2012   "at rest; lying down; w/exertion"    Past Surgical History:  Procedure Laterality Date  . ANTERIOR CERVICAL DECOMP/DISCECTOMY FUSION N/A 10/23/2018   Procedure: ANTERIOR CERVICAL DECOMPRESSION FUSION, CERVICAL 5-6, CERVICAL 6-7 , WITH POSSIBLE C6-7 CORPECTOMY;  Surgeon: Phylliss Bob, MD;  Location: [place];  Service: Orthopedics;  Laterality: N/A;  . APPENDECTOMY  2001  . POSTERIOR CERVICAL FUSION/FORAMINOTOMY Bilateral 10/24/2018   Procedure: CERVICAL FIVE-SEVEN POSTERIOR SPINAL FUSION WITH INSTRUMENTATION AND ALLOGRAFT;  Surgeon: Phylliss Bob, MD;  Location: [place];  Service: Orthopedics;  Laterality: Bilateral;  . SHOULDER SURGERY Right     There were no vitals filed for this visit.  Subjective Assessment -  01/14/19 1228    Subjective  I feel good, my surgeon was happy.    Currently in Pain?  Yes    Pain Score  1     Pain Location  Neck    Pain Orientation  Right    Pain Descriptors / Indicators  Aching    Pain Type  Surgical pain         [facility] PT Assessment - 01/14/19 0001      Assessment   Medical Diagnosis  Neck pain and stiffness (S/P C5-7 ACDF/PSF    Referring Provider (PT)  Phylliss Bob, MD    Onset Date/Surgical Date  10/24/18      AROM   Cervical Flexion  35    Cervical Extension  40    Cervical - Right Side Bend  25    Cervical - Left Side Bend  25    Cervical - Right Rotation  75    Cervical - Left Rotation  70      Strength   Overall Strength Comments  overall 5/5 MMT grossly UE strength bilat                   [facility] Adult PT Treatment/Exercise - 01/14/19 0001      Neck Exercises: Machines for Strengthening   UBE (Upper Arm Bike)  6 min L1      Neck Exercises: Theraband   Shoulder Extension  Green;20 reps  Rows  Green;20 reps      Neck Exercises: Standing   Neck Retraction  20 reps    Other Standing Exercises  UT stretch, levator stretch 30 sec X 2 ea, then rotation MWM 5 sec X 15 ea      Neck Exercises: Seated   Other Seated Exercise  shoulder flexion, abduction, OH press with 2 lbs all 2X10      Moist Heat Therapy   Number Minutes Moist Heat  5 Minutes    Moist Heat Location  Cervical             PT Education - 01/14/19 1232    Education Details  added shoulder strength to HEP    Person(s) Educated  Patient    Methods  Explanation;Demonstration;Verbal cues;Handout    Comprehension  Verbalized understanding;Returned demonstration          PT Long Term Goals - 01/14/19 1215      PT LONG TERM GOAL #1   Title  Pt will be I and compliant with HEP (4 weeks 01/20/19)    Baseline  now independent    Status  Achieved      PT LONG TERM GOAL #2   Title  Pt will report overall less than 2/10 pain with usual activity and ADL's.  (4 weeks 01/20/19)    Baseline  1-2 /10 pain with normal ADL's    Status  Achieved      PT LONG TERM GOAL #3   Title  Pt will improve neck ROM to [facility]     Baseline  now [facility] at least 75% ROM all planes    Status  Achieved      PT LONG TERM GOAL #4   Title  Pt will have WNL UE strength for ADL's. (4 weeks 01/20/19)    Baseline  5/5 UE strength grossly    Status  Achieved            Plan - 01/14/19 1237    Clinical Impression Statement  Pt has met all PT goals and will be discharged due to progress and MD being pleased with this progress as well. His HEP was updated for more strength at home. He had no further questions or concerns regaurding DC.    Personal Factors and Comorbidities  Age    PT Treatment/Interventions  Cryotherapy;Electrical Stimulation;Iontophoresis 71m/ml Dexamethasone;Moist Heat;Therapeutic activities;Therapeutic exercise;Neuromuscular re-education;Passive range of motion;Dry needling;Taping    PT Next Visit Plan  dishcarge    PT Home Exercise Plan  neck AAROM, neck isometrics, rows, ext, UT stretch, levator stretch, neck retractions, median nerve glide    Consulted and Agree with Plan of Care  Patient       Patient will benefit from skilled therapeutic intervention in order to improve the following deficits and impairments:  Decreased activity tolerance, Decreased endurance, Decreased range of motion, Decreased strength, Hypomobility, Impaired flexibility, Increased fascial restricitons, Pain, Improper body mechanics, Postural dysfunction  Visit Diagnosis: Cervicalgia     Problem List Patient Active Problem List   Diagnosis Date Noted  . Radiculopathy 10/23/2018  . Chronic left shoulder pain 07/24/2018  . Benign prostatic hyperplasia (BPH) with straining on urination 06/29/2017  . Urinary incontinence 11/08/2016  . Abnormal vision 03/16/2013  . Erectile dysfunction 01/06/2013  . Hemorrhoids 01/06/2013  . Tobacco dependence 06/05/2011  . Dental caries  05/22/2011  . Glenohumeral arthritis 02/22/2011    BSilvestre Mesi5/08/2019, 12:39 PM   PHYSICAL THERAPY DISCHARGE SUMMARY  Visits from Start of  Care: 4  Current functional level related to goals / functional outcomes: See above   Remaining deficits: See above   Education / Equipment: HEP Plan: Patient agrees to discharge.  Patient goals were partially met. Patient is being discharged due to meeting the stated rehab goals.  ?????       [place], [place], [address] Phone: [phone]   Fax:  [phone]  Name: Delron Comer MRN: [id number] Date of Birth: 05-30-1961

## 2019-01-14 NOTE — Patient Instructions (Signed)
Access Code: [email or web address]  URL: [email or web address]  Date: 01/14/2019  Prepared by: Ivery Quale   Exercises  Standing Shoulder Flexion to 90 Degrees with Dumbbells - 10 reps - 2 sets - 2x daily - 6x weekly  Shoulder Abduction with Dumbbells - Palms Down - 10 reps - 2 sets - 2x daily - 6x weekly  Shoulder Overhead Press in Flexion with Dumbbells - 10 reps - 2 sets - 2x daily - 6x weekly

## 2019-02-18 ENCOUNTER — Other Ambulatory Visit: Payer: Self-pay | Admitting: *Deleted

## 2019-02-18 MED ORDER — FAMOTIDINE 20 MG PO TABS: 20.0000 mg | ORAL_TABLET | Freq: Two times a day (BID) | ORAL | 1 refills | Status: DC

## 2019-03-26 ENCOUNTER — Encounter: Payer: Self-pay | Admitting: Family Medicine

## 2019-03-26 DIAGNOSIS — Z720 Tobacco use: Secondary | ICD-10-CM | POA: Insufficient documentation

## 2019-03-28 ENCOUNTER — Other Ambulatory Visit: Payer: Self-pay

## 2019-03-28 ENCOUNTER — Encounter (HOSPITAL_COMMUNITY): Payer: Self-pay | Admitting: Emergency Medicine

## 2019-03-28 ENCOUNTER — Emergency Department (HOSPITAL_COMMUNITY)
Admission: EM | Admit: 2019-03-28 | Discharge: 2019-03-28 | Disposition: A | Discharge status: Home / Self Care | Payer: Medicaid Other | Attending: Emergency Medicine | Admitting: Emergency Medicine

## 2019-03-28 DIAGNOSIS — Y999 Unspecified external cause status: Secondary | ICD-10-CM | POA: Diagnosis not present

## 2019-03-28 DIAGNOSIS — Y929 Unspecified place or not applicable: Secondary | ICD-10-CM | POA: Diagnosis not present

## 2019-03-28 DIAGNOSIS — W57XXXA Bitten or stung by nonvenomous insect and other nonvenomous arthropods, initial encounter: Secondary | ICD-10-CM | POA: Insufficient documentation

## 2019-03-28 DIAGNOSIS — Y939 Activity, unspecified: Secondary | ICD-10-CM | POA: Diagnosis not present

## 2019-03-28 DIAGNOSIS — T7840XA Allergy, unspecified, initial encounter: Secondary | ICD-10-CM

## 2019-03-28 DIAGNOSIS — L539 Erythematous condition, unspecified: Secondary | ICD-10-CM | POA: Diagnosis present

## 2019-03-28 DIAGNOSIS — F17210 Nicotine dependence, cigarettes, uncomplicated: Secondary | ICD-10-CM | POA: Insufficient documentation

## 2019-03-28 MED ORDER — FAMOTIDINE 20 MG PO TABS: 20.0000 mg | ORAL_TABLET | Freq: Two times a day (BID) | ORAL | 0 refills | Status: DC

## 2019-03-28 MED ORDER — DIPHENHYDRAMINE HCL 25 MG PO CAPS
50.0000 mg | ORAL_CAPSULE | Freq: Once | ORAL | Status: AC
  Administered 2019-03-28: 50 mg via ORAL
  Filled 2019-03-28: qty 2

## 2019-03-28 MED ORDER — FAMOTIDINE 20 MG PO TABS
20.0000 mg | ORAL_TABLET | Freq: Once | ORAL | Status: AC
  Administered 2019-03-28: 20 mg via ORAL
  Filled 2019-03-28: qty 1

## 2019-03-28 MED ORDER — PREDNISONE 20 MG PO TABS: 40.0000 mg | ORAL_TABLET | Freq: Every day | ORAL | 0 refills | Status: AC

## 2019-03-28 MED ORDER — EPINEPHRINE 0.3 MG/0.3ML IJ SOAJ: 0.3000 mg | INTRAMUSCULAR | 0 refills | Status: DC | PRN

## 2019-03-28 MED ORDER — DIPHENHYDRAMINE HCL 50 MG/ML IJ SOLN: 50.0000 mg | Freq: Once | INTRAMUSCULAR | Status: DC

## 2019-03-28 MED ORDER — PREDNISONE 20 MG PO TABS
60.0000 mg | ORAL_TABLET | Freq: Once | ORAL | Status: AC
  Administered 2019-03-28: 07:00:00 60 mg via ORAL
  Filled 2019-03-28: qty 3

## 2019-03-28 NOTE — Discharge Instructions (Signed)
Thank you for allowing me to care for you today in the Emergency Department.   To help with your symptoms:   Take 1 tablet of Pepcid by mouth 2 times daily.  You can take Benadryl as directed on the label.  Starting tomorrow morning, take 2 tablets of prednisone daily for the next 4 days.  Make sure to complete the entire course when starting this medication.  Return to the emergency department if you develop severe redness to the right arm, if your fingertips turn blue, if you develop severe pain, if the swelling gets significantly worse, or if you become unable to move your arm or develop new numbness.

## 2019-03-28 NOTE — ED Provider Notes (Signed)
[facility]-EMERGENCY DEPT Provider Note   CSN: [id number] Arrival date & time: 03/28/19  0610    History   Chief Complaint Chief Complaint  Patient presents with  . Insect Bite    HPI Ethan Williams is a 58 y.o. male who presents to the emergency department with a chief complaint of right arm swelling and redness.  The patient endorses redness and swelling to the right arm that began yesterday at 11 AM.  Patient reports that he felt something on his arm and saw an ant crawling near his right elbow.  He reports that after he slept it away that he began developing swelling around the area that then extended down into the right forearm and hand.  Later in the day, he felt as if he was having more swelling in the upper arm with increasing pain in his right wrist and shoulder.  He reports he is also been having some tingling in his lips.  He reports a history of significant reactions to insect bites in the past.  He denies shortness of breath, throat closing, tongue swelling, wheezing.  Denies numbness or weakness.  No history of VTE.  Surgical history includes anterior cervical decompression fusion of C5-6 and C6-7 in February 2020.     The history is provided by the patient.    Past Medical History:  Diagnosis Date  . Anginal pain (HCC)   . Arthritis    "right shoulder" (06/06/2012), "finger joints"  . Daily headache    "here lately they've been daily" (06/06/2012)  . Dysrhythmia    "palpitations" (06/06/2012)  . External hemorrhoid, bleeding   . GERD (gastroesophageal reflux disease)   . Heart murmur    "as a child" (06/06/2012)  . Migraines    "treated for them in the last 6 months (06/06/2012)  . Pneumonia 2012; 06/06/2012  . Seasonal allergies    "outdoor; pollen, grass" (06/06/2012)  . Shortness of breath 06/06/2012   "at rest; lying down; w/exertion"    Patient Active Problem List   Diagnosis Date Noted  . Tobacco abuse 03/26/2019  . Radiculopathy  10/23/2018  . Chronic left shoulder pain 07/24/2018  . Benign prostatic hyperplasia (BPH) with straining on urination 06/29/2017  . Urinary incontinence 11/08/2016  . Abnormal vision 03/16/2013  . Erectile dysfunction 01/06/2013  . Hemorrhoids 01/06/2013  . Dental caries 05/22/2011  . Glenohumeral arthritis 02/22/2011    Past Surgical History:  Procedure Laterality Date  . ANTERIOR CERVICAL DECOMP/DISCECTOMY FUSION N/A 10/23/2018   Procedure: ANTERIOR CERVICAL DECOMPRESSION FUSION, CERVICAL 5-6, CERVICAL 6-7 , WITH POSSIBLE C6-7 CORPECTOMY;  Surgeon: Estill Bambergumonski, Mark, MD;  Location: [facility] OR;  Service: Orthopedics;  Laterality: N/A;  . APPENDECTOMY  2001  . POSTERIOR CERVICAL FUSION/FORAMINOTOMY Bilateral 10/24/2018   Procedure: CERVICAL FIVE-SEVEN POSTERIOR SPINAL FUSION WITH INSTRUMENTATION AND ALLOGRAFT;  Surgeon: Estill Bambergumonski, Mark, MD;  Location: [facility] OR;  Service: Orthopedics;  Laterality: Bilateral;  . SHOULDER SURGERY Right         Home Medications    Prior to Admission medications   Medication Sig Start Date End Date Taking? Authorizing Provider  calcium carbonate (TUMS - DOSED IN MG ELEMENTAL CALCIUM) 500 MG chewable tablet Chew 1 tablet by mouth daily as needed for indigestion or heartburn.    [provider]  Carboxymethylcellul-Glycerin (CLEAR EYES FOR DRY EYES) 1-0.25 % SOLN Apply 1 drop to eye daily as needed (dry eyes).    [provider]  diazepam (VALIUM) 5 MG tablet Take 1 tablet (5  mg total) by mouth every 6 (six) hours as needed for muscle spasms. 10/24/18   Estill Bambergumonski, Mark, MD  famotidine (PEPCID) 20 MG tablet Take 1 tablet (20 mg total) by mouth 2 (two) times daily. 03/28/19   [place] Ice A, PA-C  gabapentin (NEURONTIN) 300 MG capsule TAKE 1 CAPSULE BY MOUTH EVERY NIGHT AT BEDTIME 10/21/18   Ralene Corkraper, Timothy R, DO  Multiple Vitamin (MULTIVITAMIN WITH MINERALS) TABS tablet Take 1 tablet by mouth daily.    [provider]  oxyCODONE-acetaminophen  (PERCOCET/ROXICET) 5-325 MG tablet Take 1-2 tablets by mouth every 4 (four) hours as needed for moderate pain or severe pain. 10/24/18   Estill Bambergumonski, Mark, MD  predniSONE (DELTASONE) 20 MG tablet Take 2 tablets (40 mg total) by mouth daily for 4 days. 03/28/19 04/01/19  Inell Mimbs A, PA-C  tamsulosin (FLOMAX) 0.4 MG CAPS capsule Take 1 capsule (0.4 mg total) by mouth daily. Patient not taking: Reported on 07/30/2018 04/16/18   Cristina GongHammond, Elizabeth W, PA-C    Family History Family History  Problem Relation Age of Onset  . Cancer Father     Social History Social History   Tobacco Use  . Smoking status: Current Every Day Smoker    Packs/day: 1.00    Years: 40.00    Pack years: 40.00    Types: Cigarettes  . Smokeless tobacco: Never Used  Substance Use Topics  . Alcohol use: No    Comment: 06/06/2012 "History of abuse ; sober since 07/2009"  . 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   Review of Systems Review of Systems  Constitutional: Negative for appetite change, chills and fever.  Respiratory: Negative for shortness of breath.   Cardiovascular: Negative for chest pain.  Gastrointestinal: Negative for abdominal pain, diarrhea, nausea and vomiting.  Genitourinary: Negative for dysuria.  Musculoskeletal: Positive for myalgias. Negative for back pain.  Skin: Positive for color change. Negative for rash.  Allergic/Immunologic: Negative for immunocompromised state.  Neurological: Negative for weakness, numbness and headaches.  Psychiatric/Behavioral: Negative for confusion.     Physical Exam Updated Vital Signs BP (!) 146/87 (BP Location: Left Arm)   Pulse 72   Temp 98.5 F (36.9 C) (Oral)   Resp 18   Ht 6' (1.829 m)   Wt 88.5 kg   SpO2 98%   BMI 26.45 kg/m   Physical Exam  Vitals signs and nursing note reviewed.  Constitutional:      Appearance: He is well-developed.  HENT:     Head: Normocephalic.     Mouth/Throat:     Comments: No angioedema. Eyes:     Conjunctiva/sclera: Conjunctivae normal.  Neck:     Musculoskeletal: Neck supple.  Cardiovascular:     Rate and Rhythm: Normal rate and regular rhythm.     Heart sounds: No murmur.  Pulmonary:     Effort: Pulmonary effort is normal. No respiratory distress.     Breath sounds: No stridor. No wheezing, rhonchi or rales.     Comments: Lungs are clear to auscultation bilaterally.  No increased work of breathing. Chest:     Chest wall: No tenderness.  Abdominal:     General: There is no distension.     Palpations: Abdomen is soft.  Musculoskeletal:     Comments: Swelling noted  throughout the entire right forearm and hand.  Minimal swelling noted to the right upper arm.  Muscular compartments to the right forearm remain soft.  Radial pulses are 2+ and symmetric.  Full active and passive range of motion of the right wrist, elbow, and shoulder.  There is a circular maculopapular lesion noted to the flexor surface of the right elbow.  There is mild erythema noted throughout the arm, but the area is not warm or well demarcated.  Skin:    General: Skin is warm and dry.  Neurological:     Mental Status: He is alert.  Psychiatric:        Behavior: Behavior normal.      ED Treatments / Results  Labs (all labs ordered are listed, but only abnormal results are displayed) Labs Reviewed - No data to display  EKG None  Radiology No results found.  Procedures Procedures (including critical care time)  Medications Ordered in ED Medications  predniSONE (DELTASONE) tablet 60 mg (60 mg Oral Given 03/28/19 0653)  famotidine (PEPCID) tablet 20 mg (20 mg Oral Given 03/28/19 0653)  diphenhydrAMINE (BENADRYL) capsule 50 mg (50 mg Oral Given 03/28/19 2355)     Initial Impression / Assessment and Plan / ED  Course  I have reviewed the triage vital signs and the nursing notes.  Pertinent labs & imaging results that were available during my care of the patient were reviewed by me and considered in my medical decision making (see chart for details).        58 year old male presenting with right arm swelling and redness since yesterday that began after he saw an ant crawling on his arm.  He reports a history of previous severe reactions to insect bites.  On exam, he is neurovascularly intact to the bilateral upper extremities.  There is considerable swelling to the right forearm, but muscular compartments remain soft.  No evidence of cellulitis or compartment syndrome.  He is having some tingling in his lips, but no angioedema.  Exam is consistent with allergic reaction.  The patient was seen and independently evaluated by Dr. Christy Gentles, attending physician.  Will give prednisone, Benadryl, and Pepcid in the ER and reevaluate.  I suspect the patient's symptoms will improve and he can be discharged with a course of the same.  Patient care transferred to PA Law at the end of my shift pending reevaluation after medication administration. Patient presentation, ED course, and plan of care discussed with review of all pertinent labs and imaging. Please see his/her note for further details regarding further ED course and disposition.   Final Clinical Impressions(s) / ED Diagnoses   Final diagnoses:  Allergic reaction, initial encounter    ED Discharge Orders         Ordered    famotidine (PEPCID) 20 MG tablet  2 times daily     03/28/19 0719    predniSONE (DELTASONE) 20 MG tablet  Daily     03/28/19 0719           Antonela Freiman, Maree Erie A, PA-C 03/28/19 [phone]    Ripley Fraise, MD 03/28/19 [phone]

## 2019-03-28 NOTE — ED Provider Notes (Signed)
Signout from previous provider, Joline Maxcy, PA-C at shift change See previous providers note for full H&P  Briefly, patient was bitten by some type of insect yesterday. R forearm swollen.  Appears allergic.  Patient has also had some lip tingling.  Patient given dose of steroids, Benadryl, Pepcid.  Plan to reassess after meds. Home with prednisone if improved.  Patient has history of significant allergic reactions to insect bites.  Patient also evaluated by attending, Dr. Christy Gentles, who guided the patient's management and agrees with plan.  7:45 AM on reevaluation, patient states that his lip tingling has resolved and his swelling does seem to be improving.  Will discharge home with prednisone, Pepcid, Benadryl, as well as EpiPen.  Patient counseled on use of this for worse reactions in the future.  Return precautions discussed.  Patient understands and agrees with plan.  Patient vitals stable throughout ED course and discharged in satisfactory condition.    Frederica Kuster, PA-C 03/28/19 6144    Ripley Fraise, MD 03/30/19 1006

## 2019-03-28 NOTE — ED Provider Notes (Signed)
Patient seen/examined in the Emergency Department in conjunction with Advanced Practice Provider McDonald Patient reports right arm redness/swelling after insect bite Exam : awake/alert Right UE - erythema/edema noted.  Distal pulses intact.   Plan: exam c/w allergic type reaction.  No other signs of systemic allergic reaction.      Ripley Fraise, MD 03/28/19 [phone]

## 2019-03-28 NOTE — ED Triage Notes (Signed)
Patient was bit by an insect on his right arm yesterday. Patient right arm is swollen.

## 2019-04-28 ENCOUNTER — Ambulatory Visit: Payer: Medicaid Other | Admitting: Family Medicine

## 2019-05-20 ENCOUNTER — Other Ambulatory Visit: Payer: Self-pay

## 2019-05-20 ENCOUNTER — Ambulatory Visit (INDEPENDENT_AMBULATORY_CARE_PROVIDER_SITE_OTHER): Payer: Medicaid Other | Admitting: Family Medicine

## 2019-05-20 ENCOUNTER — Encounter: Payer: Self-pay | Admitting: Family Medicine

## 2019-05-20 VITALS — BP 120/72 | HR 77 | Wt 196.6 lb

## 2019-05-20 DIAGNOSIS — Z1211 Encounter for screening for malignant neoplasm of colon: Secondary | ICD-10-CM

## 2019-05-20 DIAGNOSIS — Z122 Encounter for screening for malignant neoplasm of respiratory organs: Secondary | ICD-10-CM | POA: Diagnosis not present

## 2019-05-20 DIAGNOSIS — Z720 Tobacco use: Secondary | ICD-10-CM

## 2019-05-20 DIAGNOSIS — R7303 Prediabetes: Secondary | ICD-10-CM | POA: Diagnosis not present

## 2019-05-20 DIAGNOSIS — Z0000 Encounter for general adult medical examination without abnormal findings: Secondary | ICD-10-CM | POA: Diagnosis not present

## 2019-05-20 DIAGNOSIS — G43109 Migraine with aura, not intractable, without status migrainosus: Secondary | ICD-10-CM

## 2019-05-20 DIAGNOSIS — H9312 Tinnitus, left ear: Secondary | ICD-10-CM

## 2019-05-20 DIAGNOSIS — Z23 Encounter for immunization: Secondary | ICD-10-CM

## 2019-05-20 DIAGNOSIS — Z131 Encounter for screening for diabetes mellitus: Secondary | ICD-10-CM | POA: Insufficient documentation

## 2019-05-20 DIAGNOSIS — G43101 Migraine with aura, not intractable, with status migrainosus: Secondary | ICD-10-CM | POA: Insufficient documentation

## 2019-05-20 LAB — POCT GLYCOSYLATED HEMOGLOBIN (HGB A1C): Hemoglobin A1C: 6 % — AB (ref 4.0–5.6)

## 2019-05-20 NOTE — Progress Notes (Signed)
Subjective:  CC -- Annual Physical  With the following complaints:  Reports that he has had symptoms of headache, blurred vision.  Happened 2-3 times about one-two month ago.  Would have "spells" where he has generalized weakness as well with the headache and blurry vision.   Had been told this was not related to his neck by his neurosurgeon.  Was told that he might have high blood pressure.  No focal weakness.  No LOC.  Endorses needing to lay down when symptoms occur.  Endorsed some flashes of light as well during these episodes.  Would last a few hours and then would improve with lying down, closing eyes, and taking tylenol or aspirin.  Also had nausea.  No vomiting.  Thought that he felt his heart flutter during the episodes as well, but is unsure.  Would check BP afterwards and his mother told him it "wasn't normal," but he doesn't know what it was.  Would never check BP curing episodes.  Breathing has gotten worse and notes that he has COPD and would like to stop smoking.  States that he was diagnosed with COPD years ago [place].  Breathing worsens after smoking.  Smoking about 1 ppd, has been >30 years.  Chronic cough with clear sputum.  Not on inhalers for COPD.  Has done a study in the past with vaping.    Notes ringing in his left ear with certain tones of sounds.  Notes he can still hear.  Has some buzzing.  Also has a history of heavy machinery use and going to rock concerts.    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  Does have some SOB, progressively worsening.  No LE edema.  Recent neck surgery.  Concern for "skin growth" on penis.  Denies concern for STDs.  Past Medical History Patient Active Problem List   Diagnosis Date Noted  . Tinnitus of left ear 05/20/2019  . Screening for diabetes mellitus 05/20/2019  . Screening for colon cancer 05/20/2019  . Encounter for screening for malignant neoplasm of respiratory organs 05/20/2019  . Need for immunization against influenza 05/20/2019  . Migraine with aura and without status migrainosus, not intractable 05/20/2019  . Tobacco abuse 03/26/2019  . Radiculopathy 10/23/2018  . Chronic left shoulder pain 07/24/2018  . Benign prostatic hyperplasia (BPH) with straining on urination 06/29/2017  . Urinary incontinence 11/08/2016  . Abnormal vision 03/16/2013  . Erectile dysfunction 01/06/2013  . Hemorrhoids 01/06/2013  .  Dental caries 05/22/2011  . Annual physical exam 04/20/2011  . Glenohumeral arthritis 02/22/2011    Medications- reviewed and updated Current Outpatient Medications  Medication Sig Dispense Refill  . calcium carbonate (TUMS - DOSED IN MG ELEMENTAL CALCIUM) 500 MG chewable tablet Chew 1 tablet by mouth daily as needed for indigestion or heartburn.    . Carboxymethylcellul-Glycerin (CLEAR EYES FOR DRY EYES) 1-0.25 % SOLN Apply 1 drop to eye daily as needed (dry eyes).    . diazepam (VALIUM) 5 MG tablet Take 1 tablet (5 mg total) by mouth every 6 (six) hours as needed for muscle spasms. 30 tablet  0  . EPINEPHrine (EPIPEN 2-PAK) 0.3 mg/0.3 mL IJ SOAJ injection Inject 0.3 mLs (0.3 mg total) into the muscle as needed for anaphylaxis. 1 each 0  . famotidine (PEPCID) 20 MG tablet Take 1 tablet (20 mg total) by mouth 2 (two) times daily. 30 tablet 0  . gabapentin (NEURONTIN) 300 MG capsule TAKE 1 CAPSULE BY MOUTH EVERY NIGHT AT BEDTIME 30 capsule 1  . Multiple Vitamin (MULTIVITAMIN WITH MINERALS) TABS tablet Take 1 tablet by mouth daily.    . tamsulosin (FLOMAX) 0.4 MG CAPS capsule Take 1 capsule (0.4 mg total) by mouth daily. (Patient not taking: Reported on 07/30/2018) 30 capsule 0   No current facility-administered medications for this visit.     Objective: BP 120/72   Pulse 77   Wt 196 lb 9.6 oz (89.2 kg)   SpO2 98%   BMI 26.66 kg/m  Gen: NAD, alert, cooperative with exam HEENT: NCAT, EOMI, PERRL CV: RRR, good S1/S2, no murmur Resp: CTABL, no wheezes, non-labored Abd: Soft, Non Tender, Non Distended, BS present, no guarding or organomegaly Genital Exam: not done Ext: No edema, warm Neuro: Alert and oriented, No gross deficits, sensation intact throughout, strength 5/5 BUE/BLE   Assessment/Plan:  Annual physical exam Health maintenance reviewed.  Order appropriate screening.  Flu shot received.  Order CMP, CBC, Lipid Panel, A1c for screening.  Tinnitus of left ear Likely 2/2 hearing loss. Refer to ENT  Migraine with aura and without status migrainosus, not intractable Neurologically intact.  Symptoms consistent with migraine.  Will obtain CMP, CBC.  Do not suspect that her blood pressure is contributing as has been within normal limits.  We will continue to monitor.  Advised patient that if occurs again, can take Tylenol.  If any symptoms of focal weakness, numbness tingling, further changes in vision, he should be evaluated right away by urgent care or emergency department.  Follow-up in 2 months.  Screening for diabetes mellitus Given age and BMI greater than 25, will  check A1c today.  A1c 6.0, prediabetes range.  Patient to work on diet and exercise.  Screening for colon cancer Referral placed for colonoscopy.  Patient given colonoscopy form to schedule.  Encounter for screening for malignant neoplasm of respiratory organs Patient meets criteria for low-dose CT scan for screening for lung cancer.  Advised of this.  Order CT scan, will be contacted for scheduling.  Need for immunization against influenza Flu shot given today.  Tobacco abuse Discussed with patient options for quitting.  States that he would like to think about this.  Given 1 800 quit line phone number.  Advised able to follow-up if needed to discuss.  Advised to follow up to discuss concerns for penis.  Orders Placed This Encounter  Procedures  . CT CHEST LUNG CA SCREEN LOW DOSE W/O CM    196 / no needs / self  pay - $299 / [facility]  Epic order/ miriam w april  05/20/2019 no to COVID-19 questions/ miriam 10:41am    Standing Status:   Future    Standing Expiration Date:   07/19/2020    Order Specific Question:   Reason for Exam (SYMPTOM  OR DIAGNOSIS REQUIRED)    Answer:   screening, >30 pack year    Order Specific Question:   Radiology Contrast Protocol - do NOT remove file path    Answer:   [email or web address]  . Flu Vaccine QUAD 36+ mos IM  . Lipid Panel  . CBC  . Comprehensive metabolic panel    Order Specific Question:   Has the patient fasted?    Answer:   No  . Ambulatory referral to ENT    Referral Priority:   Routine    Referral Type:   Consultation    Referral Reason:   Specialty Services Required    Requested Specialty:   Otolaryngology    Number of Visits Requested:   1  . Ambulatory referral to Gastroenterology    Referral Priority:   Routine    Referral Type:   Consultation    Referral Reason:   Specialty Services Required    Number of Visits Requested:   1  . HgB A1c    No orders of the defined types were placed in this encounter.     Luis Abed, DO, PGY-2 05/20/2019 12:18 PM

## 2019-05-20 NOTE — Assessment & Plan Note (Signed)
Patient meets criteria for low-dose CT scan for screening for lung cancer.  Advised of this.  Order CT scan, will be contacted for scheduling.

## 2019-05-20 NOTE — Assessment & Plan Note (Signed)
Referral placed for colonoscopy.  Patient given colonoscopy form to schedule. 

## 2019-05-20 NOTE — Assessment & Plan Note (Signed)
Given age and BMI greater than 25, will check A1c today.  A1c 6.0, prediabetes range.  Patient to work on diet and exercise.

## 2019-05-20 NOTE — Assessment & Plan Note (Signed)
Likely 2/2 hearing loss. Refer to ENT

## 2019-05-20 NOTE — Assessment & Plan Note (Signed)
Flu shot given today

## 2019-05-20 NOTE — Assessment & Plan Note (Signed)
Health maintenance reviewed.  Order appropriate screening.  Flu shot received.  Order CMP, CBC, Lipid Panel, A1c for screening.

## 2019-05-20 NOTE — Patient Instructions (Addendum)
Happy birthday!!  Thank you for coming to see me today. It was a pleasure to meet you. Today we talked about:   You personal concern: make another appointment to come back.  Your headaches: If this happens again and you are concerned, please go to the ED or urgent care.    You are due for a colonoscopy.  Please use the form that we have given you to schedule this at your convenience.   You need a screening CT scan for lung cancer.    I have placed a referral to ENT for your ringing in your ear.  If you do not hear from them in the next 2 weeks, please give [place] a call.  I will send a message to the doctor in our clinic who does pulmonary function tests and we will get you scheduled if we are doing them.  Please follow-up with me in 2 months to ensure this is all being addressed, or sooner as needed.  If you have any questions or concerns, please do not hesitate to call the office at [phone].  Best,   Ethan Constable, DO   Call 1800-QUIT-NOW for help with stopping smoking. They can assist with free resources such as patches, check-in calls, and counseling.

## 2019-05-20 NOTE — Assessment & Plan Note (Signed)
Discussed with patient options for quitting.  States that he would like to think about this.  Given 1 800 quit line phone number.  Advised able to follow-up if needed to discuss.

## 2019-05-20 NOTE — Assessment & Plan Note (Signed)
Neurologically intact.  Symptoms consistent with migraine.  Will obtain CMP, CBC.  Do not suspect that her blood pressure is contributing as has been within normal limits.  We will continue to monitor.  Advised patient that if occurs again, can take Tylenol.  If any symptoms of focal weakness, numbness tingling, further changes in vision, he should be evaluated right away by urgent care or emergency department.  Follow-up in 2 months.

## 2019-05-21 ENCOUNTER — Encounter: Payer: Self-pay | Admitting: Gastroenterology

## 2019-05-21 LAB — COMPREHENSIVE METABOLIC PANEL
ALT: 27 IU/L (ref 0–44)
AST: 16 IU/L (ref 0–40)
Albumin/Globulin Ratio: 1.9 (ref 1.2–2.2)
Albumin: 4.6 g/dL (ref 3.8–4.9)
Alkaline Phosphatase: 67 IU/L (ref 39–117)
BUN/Creatinine Ratio: 9 (ref 9–20)
BUN: 11 mg/dL (ref 6–24)
Bilirubin Total: 0.2 mg/dL (ref 0.0–1.2)
CO2: 24 mmol/L (ref 20–29)
Calcium: 9.3 mg/dL (ref 8.7–10.2)
Chloride: 100 mmol/L (ref 96–106)
Creatinine, Ser: 1.18 mg/dL (ref 0.76–1.27)
GFR calc Af Amer: 78 mL/min/{1.73_m2} (ref >59)
GFR calc non Af Amer: 67 mL/min/{1.73_m2} (ref >59)
Globulin, Total: 2.4 g/dL (ref 1.5–4.5)
Glucose: 94 mg/dL (ref 65–99)
Potassium: 4.8 mmol/L (ref 3.5–5.2)
Sodium: 140 mmol/L (ref 134–144)
Total Protein: 7 g/dL (ref 6.0–8.5)

## 2019-05-21 LAB — CBC
Hematocrit: 45 % (ref 37.5–51.0)
Hemoglobin: 15.2 g/dL (ref 13.0–17.7)
MCH: 29.3 pg (ref 26.6–33.0)
MCHC: 33.8 g/dL (ref 31.5–35.7)
MCV: 87 fL (ref 79–97)
Platelets: 282 10*3/uL (ref 150–450)
RBC: 5.19 x10E6/uL (ref 4.14–5.80)
RDW: 14.1 % (ref 11.6–15.4)
WBC: 8.2 10*3/uL (ref 3.4–10.8)

## 2019-05-21 LAB — LIPID PANEL
Chol/HDL Ratio: 4.5 ratio (ref 0.0–5.0)
Cholesterol, Total: 200 mg/dL — ABNORMAL HIGH (ref 100–199)
HDL: 44 mg/dL (ref >39)
LDL Chol Calc (NIH): 135 mg/dL — ABNORMAL HIGH (ref 0–99)
Triglycerides: 116 mg/dL (ref 0–149)
VLDL Cholesterol Cal: 21 mg/dL (ref 5–40)

## 2019-05-27 ENCOUNTER — Encounter: Payer: Self-pay | Admitting: Family Medicine

## 2019-06-03 ENCOUNTER — Encounter: Payer: Self-pay | Admitting: Family Medicine

## 2019-06-03 ENCOUNTER — Ambulatory Visit (INDEPENDENT_AMBULATORY_CARE_PROVIDER_SITE_OTHER): Payer: Medicaid Other | Admitting: Family Medicine

## 2019-06-03 ENCOUNTER — Other Ambulatory Visit: Payer: Self-pay

## 2019-06-03 VITALS — BP 114/78 | HR 85 | Wt 195.8 lb

## 2019-06-03 DIAGNOSIS — N5089 Other specified disorders of the male genital organs: Secondary | ICD-10-CM | POA: Insufficient documentation

## 2019-06-03 DIAGNOSIS — N489 Disorder of penis, unspecified: Secondary | ICD-10-CM | POA: Diagnosis not present

## 2019-06-03 DIAGNOSIS — E785 Hyperlipidemia, unspecified: Secondary | ICD-10-CM | POA: Diagnosis not present

## 2019-06-03 DIAGNOSIS — G43109 Migraine with aura, not intractable, without status migrainosus: Secondary | ICD-10-CM

## 2019-06-03 DIAGNOSIS — Z0130 Encounter for examination of blood pressure without abnormal findings: Secondary | ICD-10-CM

## 2019-06-03 MED ORDER — ROSUVASTATIN CALCIUM 10 MG PO TABS: 10.0000 mg | ORAL_TABLET | Freq: Every day | ORAL | 0 refills | Status: DC

## 2019-06-03 NOTE — Assessment & Plan Note (Signed)
Continue to suspect symptoms were 2/2 migraine, although advised patient that if one of these episodes previously described occurs again, to be seen in ED for evaluation.  Reassured that they have not occurred in the last few months.

## 2019-06-03 NOTE — Progress Notes (Signed)
Subjective: Chief Complaint  Patient presents with  . Hypertension     HPI: Ethan Williams is a 58 y.o. presenting to clinic today to discuss the following:  1 Headaches and Dizzy Spells Has not had since last visit.  At last visit on 9/15, had not had any in 1-2 months.  BMP and CBC were WNL on 9/15.    2 Hyperlipidemia The 10-year ASCVD risk score Denman George DC Jr., et al., 2013) is: 11.7%   Values used to calculate the score:     Age: 68 years     Sex: Male     Is Non-Hispanic African American: No     Diabetic: No     Tobacco smoker: Yes     Systolic Blood Pressure: 114 mmHg     Is BP treated: No     HDL Cholesterol: 44 mg/dL     Total Cholesterol: 200 mg/dL Needs moderate intensity statin.  Has never been on statin before.  3 Blood Pressure  Concerned about blood pressure and wanted it checked.  4 Skin Lesion on Penis Thinks maybe it is blood vessels that occurred after a scratch or something.  Wants to have it checked out.  Not concerned for STD.  Is bothering him that he doesn;t know what it is and would like to talk about getting it removed if possible.  Doesn't bleed or ulcerate.  Not painful.  No itching.  No penile discharge.  Has been present for years and has not changed.  Health Maintenance: scheduled for colonoscopy     ROS noted in HPI. Chief complaint noted.  Other Pertinent PMH: Migraines, Pre-diabetes Past Medical, Surgical, Social, and Family History Reviewed & Updated per EMR.      Social History   Tobacco Use  Smoking Status Current Every Day Smoker  . Packs/day: 1.00  . Years: 40.00  . Pack years: 40.00  . Types: Cigarettes  Smokeless Tobacco Never Used   Smoking status noted.    Objective: BP 114/78   Pulse 85   Wt 195 lb 12.8 oz (88.8 kg)   SpO2 97%   BMI 26.56 kg/m  Vitals and nursing notes reviewed  Physical Exam:  General: 58 y.o. male in NAD Lungs: Breathing comfortably on RA Skin: warm and dry Extremities: No edema  GU: three small flesh colored papules, no verrucous appearance, no ulceration, no ulcerations noted   No results found for this or any previous visit (from the past 72 hour(s)).  Assessment/Plan:  Hyperlipidemia Discussed with patient.  Again encouraged tobacco cessation and proper diet.  Will start Crestor and repeat LDL in 1 month.  Advised of leg cramps as side effect.    Penile lesion Does not appear concerning for cancer or penile warts.  Will refer to urology for possible removal at patient's request.  Blood pressure check BP has been WNL at last two visits.  Patient reassured of this.  Migraine with aura and without status migrainosus, not intractable Continue to suspect symptoms were 2/2 migraine, although advised patient that if one of these episodes previously described occurs again, to be seen in ED for evaluation.  Reassured that they have not occurred in the last few months.     PATIENT EDUCATION PROVIDED: See AVS    Diagnosis and plan along with any newly prescribed medication(s) were discussed in detail with this patient today. The patient verbalized understanding and agreed with the plan. Patient advised if symptoms worsen return to clinic or ER.  Orders Placed This Encounter  Procedures  . Ambulatory referral to Urology    Referral Priority:   Routine    Referral Type:   Consultation    Referral Reason:   Specialty Services Required    Requested Specialty:   Urology    Number of Visits Requested:   1    Meds ordered this encounter  Medications  . rosuvastatin (CRESTOR) 10 MG tablet    Sig: Take 1 tablet (10 mg total) by mouth daily.    Dispense:  90 tablet    Refill:  0     Arizona Constable, DO 06/03/2019, 1:40 PM PGY-2 [place]

## 2019-06-03 NOTE — Assessment & Plan Note (Signed)
Does not appear concerning for cancer or penile warts.  Will refer to urology for possible removal at patient's request.

## 2019-06-03 NOTE — Assessment & Plan Note (Addendum)
Discussed with patient.  Again encouraged tobacco cessation and proper diet.  Will start Crestor and repeat LDL in 1 month.  Advised of leg cramps as side effect.

## 2019-06-03 NOTE — Patient Instructions (Addendum)
Dr. Erik Obey ENT, call [phone] to schedule an appointment.  We will start you on a new cholesterol medication called Crestor.  Take it every day.  We will recheck your cholesterol levels in 1 month.  Call 1800-QUIT-NOW for help with stopping smoking. They can assist with free resources such as patches, check-in calls, and counseling.   Your blood pressure looked good today.   I have placed a referral to Urology for your skin concerns.  If you do not hear from them in the next 2 weeks, please give [place] a call.  If you experience another episode like before, go to the emergency room right away.

## 2019-06-03 NOTE — Assessment & Plan Note (Signed)
BP has been WNL at last two visits.  Patient reassured of this.

## 2019-06-11 ENCOUNTER — Other Ambulatory Visit: Payer: Self-pay

## 2019-06-11 ENCOUNTER — Ambulatory Visit (HOSPITAL_COMMUNITY)
Admission: RE | Admit: 2019-06-11 | Discharge: 2019-06-11 | Disposition: A | Discharge status: Home / Self Care | Payer: Medicaid Other | Source: Ambulatory Visit | Attending: Family Medicine | Admitting: Family Medicine

## 2019-06-11 DIAGNOSIS — Z122 Encounter for screening for malignant neoplasm of respiratory organs: Secondary | ICD-10-CM

## 2019-06-16 ENCOUNTER — Other Ambulatory Visit: Payer: Self-pay

## 2019-06-16 ENCOUNTER — Ambulatory Visit (AMBULATORY_SURGERY_CENTER): Payer: Self-pay | Admitting: *Deleted

## 2019-06-16 VITALS — Temp 97.8°F | Ht 72.0 in | Wt 197.6 lb

## 2019-06-16 DIAGNOSIS — Z1211 Encounter for screening for malignant neoplasm of colon: Secondary | ICD-10-CM

## 2019-06-16 MED ORDER — GOLYTELY 236 G PO SOLR: 4000.0000 mL | Freq: Once | ORAL | 0 refills | Status: AC

## 2019-06-16 NOTE — Progress Notes (Signed)
Patient denies any allergies to egg or soy products. Patient denies complications with anesthesia/sedation.  Patient denies oxygen use at home and denies diet medications. Emmi instructions for colonoscopy/endoscopy explained and given to patient.   

## 2019-06-18 ENCOUNTER — Ambulatory Visit (HOSPITAL_COMMUNITY)
Admission: RE | Admit: 2019-06-18 | Discharge: 2019-06-18 | Disposition: A | Discharge status: Home / Self Care | Payer: Medicaid Other | Source: Ambulatory Visit | Attending: Family Medicine | Admitting: Family Medicine

## 2019-06-18 ENCOUNTER — Other Ambulatory Visit: Payer: Self-pay

## 2019-06-18 DIAGNOSIS — Z122 Encounter for screening for malignant neoplasm of respiratory organs: Secondary | ICD-10-CM | POA: Insufficient documentation

## 2019-06-19 ENCOUNTER — Encounter: Payer: Self-pay | Admitting: Family Medicine

## 2019-06-24 ENCOUNTER — Encounter: Payer: Medicaid Other | Admitting: Gastroenterology

## 2019-07-01 ENCOUNTER — Ambulatory Visit: Payer: Medicaid Other | Admitting: Family Medicine

## 2019-07-01 ENCOUNTER — Other Ambulatory Visit: Payer: Self-pay

## 2019-07-01 ENCOUNTER — Encounter: Payer: Self-pay | Admitting: Family Medicine

## 2019-07-01 VITALS — BP 118/82 | HR 87 | Ht 72.0 in | Wt 196.0 lb

## 2019-07-01 DIAGNOSIS — E785 Hyperlipidemia, unspecified: Secondary | ICD-10-CM

## 2019-07-01 DIAGNOSIS — R0602 Shortness of breath: Secondary | ICD-10-CM | POA: Diagnosis not present

## 2019-07-01 DIAGNOSIS — Z1211 Encounter for screening for malignant neoplasm of colon: Secondary | ICD-10-CM | POA: Diagnosis not present

## 2019-07-01 DIAGNOSIS — Z720 Tobacco use: Secondary | ICD-10-CM | POA: Diagnosis not present

## 2019-07-01 MED ORDER — ALBUTEROL SULFATE HFA 108 (90 BASE) MCG/ACT IN AERS: 2.0000 | INHALATION_SPRAY | RESPIRATORY_TRACT | 0 refills | Status: DC | PRN

## 2019-07-01 NOTE — Assessment & Plan Note (Addendum)
Patient started on Crestor on 9/29.  Tolerating statin well.  Will check LDL today to ensure proper decrease.  Continue to encourage cessation of smoking and proper diet and exercise.

## 2019-07-01 NOTE — Assessment & Plan Note (Signed)
Motivational interviewing use.  Continue to encourage smoking cessation.  Patient notes that he still has the quit line number.  Will contact when he decides to decrease smoking.  States that he does not want my help at this time.

## 2019-07-01 NOTE — Patient Instructions (Signed)
Thank you for coming to see me today. It was a pleasure. Today we talked about:   Your smoking: keep working on that!  Let me know if you need my help!  Your breathing: I have sent an albuterol inhaler for you to use as needed for shortness of breath.  Your colon cancer screening: We will do a stool test.  Your cholesterol: We will recheck one of your cholesterol numbers today.   Please follow-up with me in 3 months  If you have any questions or concerns, please do not hesitate to call the office at [phone].  Best,   Arizona Constable, DO

## 2019-07-01 NOTE — Progress Notes (Signed)
Subjective: Chief Complaint  Patient presents with  . Results     HPI: Ethan Williams is a 58 y.o. presenting to clinic today to discuss the following:  1 HLD F/U Started on Crestor 10mg  on 9/29.  Has been tolerating and compliant with medication.  LDL at that time was 135.  Denies chest pain, changes in breathing, leg cramps.    2  Tobacco Abuse Had previously noted a desire to quit smoking.  Currently smoking 1 pack or less a day.  Hasn't fully decided that he wants to do this at this time.   Does not that it has gotten very expensive for him and he knows it can increase his risk of cancer and make his breathing worse.   3 Shortness of Breath Reported history of COPD.  No current treatment for this.  Has spoke with Dr. Valentina Lucks and we are unable to provide PFTs currently given COVID-19.  Patient reports he is not having shortness of breath or dyspnea on exertion.  No fevers, cough, sputum production, or fatigue.  Occasionally has some wheezing, usually before bed, after heavy smoking.  Feels like symptoms are not daily and not even weekly.  Health Maintenance: Needs Colonoscopy, given paper and order placed 2 visits ago, patient reports he cancelled it and doesn't want to do it right now     ROS noted in HPI. Chief complaint noted.  Other Pertinent PMH: History of tobacco abuse, prediabetes, hyperlipidemia, BPH Past Medical, Surgical, Social, and Family History Reviewed & Updated per EMR.      Social History   Tobacco Use  Smoking Status Current Every Day Smoker  . Packs/day: 1.00  . Years: 45.00  . Pack years: 45.00  . Types: Cigarettes  Smokeless Tobacco Never Used   Smoking status noted.    Objective: BP 118/82   Pulse 87   Ht 6' (1.829 m)   Wt 196 lb (88.9 kg)   SpO2 98%   BMI 26.58 kg/m  Vitals and nursing notes reviewed  Physical Exam:  General: 58 y.o. male in NAD Cardio: RRR no m/r/g Lungs: CTAB, no wheezing, no rhonchi, no crackles, no IWOB on RA  Skin: warm and dry Extremities: Moving all 4 extremities without difficulty   No results found for this or any previous visit (from the past 72 hour(s)).  Assessment/Plan:  Hyperlipidemia Patient started on Crestor on 9/29.  Tolerating statin well.  Will check LDL today to ensure proper decrease.  Continue to encourage cessation of smoking and proper diet and exercise.  Tobacco abuse Motivational interviewing use.  Continue to encourage smoking cessation.  Patient notes that he still has the quit line number.  Will contact when he decides to decrease smoking.  States that he does not want my help at this time.  Shortness of breath Self-reported history of COPD, emphysema changes seen on CT chest.  His symptoms are very minimal, we cannot perform PFTs this time in office.  Given that his symptoms are so minimal, would treat with albuterol as needed at this time.  Should he start having an increase in symptoms, will consider starting LAMA therapy daily, but discussed this with patient, and he would like to use only as needed SABA at this time.  Follow-up in 3 months or sooner as needed.   PATIENT EDUCATION PROVIDED: See AVS    Diagnosis and plan along with any newly prescribed medication(s) were discussed in detail with this patient today. The patient verbalized understanding  and agreed with the plan. Patient advised if symptoms worsen return to clinic or ER.   Health Maintainance: Patient declines colonoscopy, therefore will obtain FOBT.  Patient advised that if positive, would need colonoscopy.  FOBT ordered today.   Orders Placed This Encounter  Procedures  . Direct LDL  . IFOBT POC (occult bld, rslt in office)    Standing Status:   Future    Standing Expiration Date:   06/30/2020    Meds ordered this encounter  Medications  . albuterol (VENTOLIN HFA) 108 (90 Base) MCG/ACT inhaler    Sig: Inhale 2 puffs into the lungs every 4 (four) hours as needed for wheezing or shortness of  breath.    Dispense:  8 g    Refill:  0     Luis Abed, DO 07/01/2019, 4:54 PM PGY-2 [facility]

## 2019-07-01 NOTE — Assessment & Plan Note (Signed)
Self-reported history of COPD, emphysema changes seen on CT chest.  His symptoms are very minimal, we cannot perform PFTs this time in office.  Given that his symptoms are so minimal, would treat with albuterol as needed at this time.  Should he start having an increase in symptoms, will consider starting LAMA therapy daily, but discussed this with patient, and he would like to use only as needed SABA at this time.

## 2019-07-02 ENCOUNTER — Encounter: Payer: Self-pay | Admitting: Family Medicine

## 2019-07-02 LAB — LDL CHOLESTEROL, DIRECT: LDL Direct: 81 mg/dL (ref 0–99)

## 2019-09-03 ENCOUNTER — Other Ambulatory Visit: Payer: Self-pay

## 2019-09-03 MED ORDER — FAMOTIDINE 20 MG PO TABS: 20.0000 mg | ORAL_TABLET | Freq: Two times a day (BID) | ORAL | 3 refills | Status: DC

## 2019-11-12 ENCOUNTER — Other Ambulatory Visit: Payer: Self-pay

## 2019-11-12 ENCOUNTER — Ambulatory Visit: Payer: Medicaid Other | Admitting: Family Medicine

## 2019-11-12 ENCOUNTER — Encounter: Payer: Self-pay | Admitting: Family Medicine

## 2019-11-12 VITALS — BP 132/78 | HR 81 | Wt 203.4 lb

## 2019-11-12 DIAGNOSIS — N489 Disorder of penis, unspecified: Secondary | ICD-10-CM | POA: Diagnosis not present

## 2019-11-12 DIAGNOSIS — J439 Emphysema, unspecified: Secondary | ICD-10-CM | POA: Diagnosis not present

## 2019-11-12 DIAGNOSIS — E785 Hyperlipidemia, unspecified: Secondary | ICD-10-CM

## 2019-11-12 DIAGNOSIS — R4586 Emotional lability: Secondary | ICD-10-CM | POA: Diagnosis present

## 2019-11-12 DIAGNOSIS — Z1211 Encounter for screening for malignant neoplasm of colon: Secondary | ICD-10-CM | POA: Diagnosis not present

## 2019-11-12 DIAGNOSIS — R0602 Shortness of breath: Secondary | ICD-10-CM | POA: Diagnosis not present

## 2019-11-12 MED ORDER — SPIRIVA HANDIHALER 18 MCG IN CAPS: 18.0000 ug | ORAL_CAPSULE | Freq: Every day | RESPIRATORY_TRACT | 12 refills | Status: DC

## 2019-11-12 MED ORDER — ROSUVASTATIN CALCIUM 10 MG PO TABS: 10.0000 mg | ORAL_TABLET | Freq: Every day | ORAL | 3 refills | Status: DC

## 2019-11-12 MED ORDER — ALBUTEROL SULFATE HFA 108 (90 BASE) MCG/ACT IN AERS: 2.0000 | INHALATION_SPRAY | RESPIRATORY_TRACT | 3 refills | Status: DC | PRN

## 2019-11-12 NOTE — Progress Notes (Signed)
SUBJECTIVE:   CHIEF COMPLAINT / HPI:   History of mental health problems: Anxiety, Depression, and also reports history of schizophrenia Has followed with psych in the past, but hasn't gone in a long time.  [place] not on medication.  Has been having thoughts that he is not happy about and hearing voices again for the last year or so.  Denies the thoughts telling him to hurt himself, but states that they tell him to hurt others at time.  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  AA and NA meetings have been less and he doesn't like virtual meetings because he doesn't use them.      Office Visit from 11/12/2019 in [facility]  PHQ-9 Total Score  18     GAD 7 : Generalized Anxiety Score 11/12/2019  Nervous, Anxious, on Edge 2  Control/stop worrying 2  Worry too much - different things 2  Trouble relaxing 2  Restless 2  Afraid - awful might happen 2  Anxiety Difficulty Somewhat difficult      Shortness of breath follow-up Patient has a history of self-reported COPD with emphysema changes seen on last CT.  At his last visit in October, discussed possibly starting LAMA therapy even though do not have PFTs to confirm COPD at this time.  He reports overall "it's okay."  Reports "spells when I can hear it whistling."  Reports this happens a few times a week.  Uses albuterol 1-2 times a week on average.    HLD Patient reports that he is tolerating crestor well.  No cp or SOB, requesting refill.  Penile lesion  Patient had been seen in September 2020 for penile lesion.  At that time on examination of been noted to have 3 small flesh-colored papules.  States that they are unchanged.  States that he has not heard anything from the urologist that he was referred to.   PERTINENT  PMH / PSH: History of tobacco abuse, prediabetes, hyperlipidemia, BPH  OBJECTIVE:   BP 132/78   Pulse 81   Wt 203 lb 6.4 oz (92.3 kg)   SpO2 97%   BMI 27.59 kg/m     Physical Exam:  General: 59 y.o. male in NAD Cardio: RRR no m/r/g Lungs: CTAB, no wheezing, no rhonchi, no crackles, no IWOB on RA Skin: warm and dry Extremities: No edema Psych: No SI, endorses AH with passive HI   ASSESSMENT/PLAN:   Mood disturbance Patient endorsing homicidal ideation with auditory hallucinations.  Has a self-reported history of schizophrenia, cannot see this in the chart.  He reports that he previously has followed with [facility], but has been off his medications for over 1 year because he felt that he was being overmedicated there and did not want to return.  Complicated by history of alcohol and drug abuse, has been clean for the last 10 years per his report.  Previously was doing well going to NA and AA meetings, but this is no longer possible for him given that most are virtual and he does not like to attend meetings on the computer.  Precepted with Dr. Shawnee Knapp.  Contacted [place] [facility] urgent care who recommended that patient come over for immediate evaluation with psychiatrist.  Does not meet criteria for inpatient hospitalization having the thoughts are passive, he does not have access to a firearm, and does not have any prior military experience.  He reports "I just have these thoughts  sometimes, they come and go, I am always able to push them to the side and have never acted on them."  He declined going to urgent care, but wanted phone numbers for psychiatrist that he could call.  He wants to have control over who he sees because he wants to make sure that he will not be overmedicated again.  Safety planning performed with patient, he stated that he is always able to shake these thoughts off, but he knows how to be able to take a step back and not move forward with what the voices are telling him to do.  He states that he would never hurt anyone.  Patient given resources for psychiatrist in the area and encouraged to call them.  Also given suicide hotline number.   Patient scheduled for first follow-up with myself at first available appointment on 3/30.  Shortness of breath Treating is likely COPD given COVID-19 pandemic cannot currently obtain PFTs as outpatient.  Had discussed previously starting LAMA therapy.  Patient seems to be using albuterol a significant amount, therefore will start Spiriva daily.  Patient advised to have pharmacy show him how to use this.  Hyperlipidemia Tolerating current treatment.  Refill Crestor.  Last LDL was 81.  Penile lesion New referral sent to urology at patient's request.  Patient given [facility] number for [facility] to call them himself.     Cleophas Dunker, [place]

## 2019-11-12 NOTE — Patient Instructions (Addendum)
Thank you for coming to see me today. It was a pleasure. Today we talked about:   Call a psychiatrist below to schedule an appointment.  [phone] This is the urology office.  Call them to make an appointment.  Call 1800-QUIT-NOW for help with stopping smoking. They can assist with free resources such as patches, check-in calls, and counseling.   Please follow-up with me in 1-2 weeks.  If you have any questions or concerns, please do not hesitate to call the office at [phone].  Best,   Luis Abed, DO  Psychiatry Resource List (Adults and Children) Most of these providers will take Medicaid. please consult your insurance for a complete and updated list of available providers. When calling to make an appointment have your insurance information available to confirm you are covered.  [facility]:   [place]: [address]     [phone]   [place]: [address] [place],        [phone] [place]: [address],    [phone] [place]: [address],                   [phone]  [facility]  (Psychiatry & counseling ; adults & children ; will take Medicaid) [address], [place], [place]        [phone]   [facility]  (Psychiatry only; Adults only, will take Medicaid)  [address], [place], [place] [address]       [phone]   [facility] (Psychiatry & counseling ; adults & children ; will take Medicaid [address]  [address]  [place] [address]   Go on-line to complete referral ( [email or web address] [phone]    (Spanish therapist)  [facility]  Psychiatry & counseling; Adults and children;  Call Registration prior to scheduling an appointment [phone] [address]    [place], [place] [address]    [phone]  [facility] (Psychiatry &  counseling; adults & children; Medicare no Medicaid)  [address]   [place], [place]  [address]      [phone]    [facility] (up to age 30)  Psychiatry & counseling ,will take Medicaid, must do counseling to receive psychiatry services  [address] [place] [address]        [phone]  [facility] (Psychiatry & counseling; adults & children; will take Medicaid) Will need a referral from provider [address],  [place], [place]  [phone]  [facility]---  Walk-in Mon-Fri, 8:30-5:00 (will take Medicaid)  [address], [place], [place]  [phone]    [facility] --- Walk-In Mon-Friday 8am-3pm ( will take Medicaid, Psychiatry, Adults & children,  [address] [place], [place]   [phone]   [facility]--, Walk-in M-F 8am-12pm and 1pm -3pm   (Counseling, Psychiatry, will take Medicaid, adults & children)  [address], [place], [place]  [phone]

## 2019-11-15 DIAGNOSIS — R4586 Emotional lability: Secondary | ICD-10-CM | POA: Insufficient documentation

## 2019-11-15 NOTE — Assessment & Plan Note (Signed)
Patient endorsing homicidal ideation with auditory hallucinations.  Has a self-reported history of schizophrenia, cannot see this in the chart.  He reports that he previously has followed with [facility], but has been off his medications for over 1 year because he felt that he was being overmedicated there and did not want to return.  Complicated by history of alcohol and drug abuse, has been clean for the last 10 years per his report.  Previously was doing well going to NA and AA meetings, but this is no longer possible for him given that most are virtual and he does not like to attend meetings on the computer.  Precepted with Dr. Shawnee Knapp.  Contacted [place] [facility] urgent care who recommended that patient come over for immediate evaluation with psychiatrist.  Does not meet criteria for inpatient hospitalization having the thoughts are passive, he does not have access to a firearm, and does not have any prior military experience.  He reports "I just have these thoughts sometimes, they come and go, I am always able to push them to the side and have never acted on them."  He declined going to urgent care, but wanted phone numbers for psychiatrist that he could call.  He wants to have control over who he sees because he wants to make sure that he will not be overmedicated again.  Safety planning performed with patient, he stated that he is always able to shake these thoughts off, but he knows how to be able to take a step back and not move forward with what the voices are telling him to do.  He states that he would never hurt anyone.  Patient given resources for psychiatrist in the area and encouraged to call them.  Also given suicide hotline number.  Patient scheduled for first follow-up with myself at first available appointment on 3/30.

## 2019-11-15 NOTE — Assessment & Plan Note (Signed)
Treating is likely COPD given COVID-19 pandemic cannot currently obtain PFTs as outpatient.  Had discussed previously starting LAMA therapy.  Patient seems to be using albuterol a significant amount, therefore will start Spiriva daily.  Patient advised to have pharmacy show him how to use this.

## 2019-11-15 NOTE — Assessment & Plan Note (Signed)
New referral sent to urology at patient's request.  Patient given [facility] number for [facility] to call them himself.

## 2019-11-15 NOTE — Assessment & Plan Note (Signed)
Tolerating current treatment.  Refill Crestor.  Last LDL was 81.

## 2019-12-02 ENCOUNTER — Ambulatory Visit: Payer: Medicaid Other | Admitting: Family Medicine

## 2019-12-02 ENCOUNTER — Encounter: Payer: Self-pay | Admitting: Family Medicine

## 2019-12-02 ENCOUNTER — Other Ambulatory Visit: Payer: Self-pay

## 2019-12-02 VITALS — BP 138/75 | HR 74 | Ht 69.0 in | Wt 203.0 lb

## 2019-12-02 DIAGNOSIS — G43101 Migraine with aura, not intractable, with status migrainosus: Secondary | ICD-10-CM | POA: Diagnosis not present

## 2019-12-02 DIAGNOSIS — R4586 Emotional lability: Secondary | ICD-10-CM | POA: Diagnosis present

## 2019-12-02 MED ORDER — SUMATRIPTAN SUCCINATE 50 MG PO TABS: 50.0000 mg | ORAL_TABLET | ORAL | 0 refills | Status: DC | PRN

## 2019-12-02 NOTE — Assessment & Plan Note (Signed)
Last PHQ-9 score 18, now improved to 9.  GAD-7 also significantly improved.  Patient denies SI/HI today on examination.  At last visit, patient reported that he does not have access to firearms as he is a convicted felon.  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  Again encouraged him to look at the resources given to him for psychiatrist in the area.  Follow-up in 1 month.

## 2019-12-02 NOTE — Progress Notes (Signed)
SUBJECTIVE:   CHIEF COMPLAINT / HPI:   Mood disturbance follow-up History of anxiety depression and reported history of schizophrenia Seen on 3-10, at that time was endorsing homicidal ideation.  Refused referral to urgent care at behavioral health.  Was given resources for psychiatrists.  Reports he has had significant improvement in his mental health.  Has not yet looked into getting a psychiatrist.  Denies SI/HI.    Office Visit from 12/02/2019 in [facility]  PHQ-9 Total Score  9     GAD 7 : Generalized Anxiety Score 12/02/2019 11/12/2019  Nervous, Anxious, on Edge 1 2  Control/stop worrying 1 2  Worry too much - different things 1 2  Trouble relaxing 1 2  Restless 1 2  Easily annoyed or irritable 1 -  Afraid - awful might happen 1 2  Total GAD 7 Score 7 -  Anxiety Difficulty Somewhat difficult Somewhat difficult     Headaches Going on about a year, since at least Feb 2020 before he had neck surgery Headaches continued to worsen afterwards They have happened 2x in the last two weeks Lasted a few hours, took tylenol and caffeine and it improved Would have a soreness in the head for a few days afterwards Blurry vision happens with headache, wants to close his eyes, sees lights in eyelids Occasional nausea, never vomited Feels that mental health contributes to it as well Headaches start in posterior/neck region and move forward into the front of the head Not positional Has never seen an eye doctor Has a history of C5-C7 spinal fusion in February 2020   PERTINENT  PMH / PSH: Anxiety, depression, schizophrenia  OBJECTIVE:   BP 138/75   Pulse 74   Ht 5\' 9"  (1.753 m)   Wt 203 lb (92.1 kg)   SpO2 98%   BMI 29.98 kg/m   Physical Exam:  General: 59 y.o. male in NAD HEENT: PERRL, EOMI Neck: Midline scar of cervical spine, no tenderness to palpation along cervical spine, hypertonicity of left trapezius, mild tenderness to palpation at origin of  trapezius on occipital bone Lungs: Breathing comfortably on room air Skin: warm and dry Neuro: CN II through XII grossly intact, sensation intact throughout bilateral upper and lower extremities, 5/5 strength BUE/BLE, gait normal   ASSESSMENT/PLAN:   Mood disturbance Last PHQ-9 score 18, now improved to 9.  GAD-7 also significantly improved.  Patient denies SI/HI today on examination.  At last visit, patient reported that he does not have access to firearms as he is a convicted felon.  He also does not have a 46 background.  Again encouraged him to look at the resources given to him for psychiatrist in the area.  Follow-up in 1 month.  Migraine with aura and with status migrainosus, not intractable Neurologically intact on exam.  History consistent with mixed headaches, likely migraine with tension component as well.  Patient has significant hypertonicity of left trapezius, which is likely contributing.  Discussed with patient treatment options, he is requesting a referral to see a headache specialist.  Patient will be referred to neurology.  Also refer him to ophthalmology for an eye examination given that he has not had an eye examination previously.  We will also prescribe Imitrex to use as needed for headache, patient advised on how to properly use Imitrex.  Also advised to not use more than 4 tablets in 1 day.  Mood could also be a contributing factor, patient was advised to follow-up with  a psychiatrist.  Follow-up in 1 month.   This note is not being shared with the patient for the following reason: To prevent harm (release of this note would result in harm to the life or physical safety of the patient or another).   Cleophas Dunker, [place]

## 2019-12-02 NOTE — Progress Notes (Signed)
PHQ 9 and GAD 7 given to patient.  Glennie Hawk, CMA

## 2019-12-02 NOTE — Assessment & Plan Note (Signed)
Neurologically intact on exam.  History consistent with mixed headaches, likely migraine with tension component as well.  Patient has significant hypertonicity of left trapezius, which is likely contributing.  Discussed with patient treatment options, he is requesting a referral to see a headache specialist.  Patient will be referred to neurology.  Also refer him to ophthalmology for an eye examination given that he has not had an eye examination previously.  We will also prescribe Imitrex to use as needed for headache, patient advised on how to properly use Imitrex.  Also advised to not use more than 4 tablets in 1 day.  Mood could also be a contributing factor, patient was advised to follow-up with a psychiatrist.  Follow-up in 1 month.

## 2019-12-02 NOTE — Patient Instructions (Signed)
Thank you for coming to see me today. It was a pleasure. Today we talked about:   Your mood: I'm glad you're doing better.  I still recommend you work on finding a psychiatrist.  Your headaches:  Sounds like you are having migraines.  I have prescribed imitrex, which you can take at the first sign of a headache.  You can repeat this every 2 hours as needed.  Do not take more than 4 tablets in 1 day.  I have placed a referral to Neurology for your headaches.  If you do not hear from them in the next 2 weeks, please give [place] a call.  I have placed a referral to Ophthalmology for an eye exam.  If you do not hear from them in the next 2 weeks, please give [place] a call.   Please follow-up with me in 1 month.  If you have any questions or concerns, please do not hesitate to call the office at [phone].  Best,   Luis Abed, DO

## 2019-12-04 ENCOUNTER — Encounter: Payer: Self-pay | Admitting: Neurology

## 2019-12-11 ENCOUNTER — Ambulatory Visit: Payer: Medicaid Other | Admitting: Family Medicine

## 2019-12-31 ENCOUNTER — Other Ambulatory Visit: Payer: Self-pay | Admitting: *Deleted

## 2020-01-01 ENCOUNTER — Encounter: Payer: Self-pay | Admitting: Family Medicine

## 2020-01-01 ENCOUNTER — Ambulatory Visit: Payer: Medicaid Other | Admitting: Family Medicine

## 2020-01-01 ENCOUNTER — Other Ambulatory Visit: Payer: Self-pay

## 2020-01-01 VITALS — BP 136/80 | HR 93 | Ht 72.0 in | Wt 202.2 lb

## 2020-01-01 DIAGNOSIS — G43101 Migraine with aura, not intractable, with status migrainosus: Secondary | ICD-10-CM | POA: Diagnosis not present

## 2020-01-01 DIAGNOSIS — R4586 Emotional lability: Secondary | ICD-10-CM | POA: Diagnosis not present

## 2020-01-01 DIAGNOSIS — E663 Overweight: Secondary | ICD-10-CM | POA: Diagnosis not present

## 2020-01-01 DIAGNOSIS — N489 Disorder of penis, unspecified: Secondary | ICD-10-CM | POA: Diagnosis present

## 2020-01-01 DIAGNOSIS — R319 Hematuria, unspecified: Secondary | ICD-10-CM | POA: Insufficient documentation

## 2020-01-01 DIAGNOSIS — E785 Hyperlipidemia, unspecified: Secondary | ICD-10-CM

## 2020-01-01 MED ORDER — FAMOTIDINE 20 MG PO TABS: 20.0000 mg | ORAL_TABLET | Freq: Two times a day (BID) | ORAL | 3 refills | Status: DC

## 2020-01-01 NOTE — Patient Instructions (Signed)
Thank you for coming to see me today. It was a pleasure. Today we talked about:   Everything looks good.  Please follow-up with me in 3 months or sooner as needed.  If you have any questions or concerns, please do not hesitate to call the office at [phone].  Best,   Luis Abed, DO

## 2020-01-01 NOTE — Assessment & Plan Note (Signed)
Patient had adequate response.  Continue Crestor 10 mg.

## 2020-01-01 NOTE — Assessment & Plan Note (Signed)
Found by work-up by neurologist.  Patient reports he has had an ultrasound and a CT scan for this.  He is not sure of the results of this at this time.  He is continuing to follow with urology regarding this.

## 2020-01-01 NOTE — Assessment & Plan Note (Signed)
Congratulated patient on healthy diet.  Continue to encourage.

## 2020-01-01 NOTE — Progress Notes (Signed)
SUBJECTIVE:   CHIEF COMPLAINT / HPI:   Penile lesion Tomorrow morning having wart removed by urology  Hematuria Found hematuria with urology work-up, having work-up for this Had [place] and CT scan States that he is continuing to follow-up with them  Headaches Has an appointment with neurology at end of next month Has been taking sumatriptan and this helps Has only had headaches twice since he was last seen Taking advil instead of tylneol, no more than 4 pills in a whole day Eye Doctor said f/u in 1 year Didn't think that eyes were affecting headaches  HLD Crestor 10mg  Doing well with this.  Patient's LDL decreased from 135 to 81 with addition, adequate response.  Mood Disorder  Has not yet found a psychiatrist Wants to get all of his medical issues taken care of before he takes care of this Denies SI/HI States that overall he has been doing well Has no access to a firearm, has a prior arrest, has no military background    Office Visit from 01/01/2020 in [place]  PHQ-9 Total Score  9     GAD 7 : Generalized Anxiety Score 01/01/2020 12/02/2019 11/12/2019  Nervous, Anxious, on Edge 1 1 2   Control/stop worrying 1 1 2   Worry too much - different things 1 1 2   Trouble relaxing 1 1 2   Restless 1 1 2   Easily annoyed or irritable 1 1 -  Afraid - awful might happen 1 1 2   Total GAD 7 Score 7 7 -  Anxiety Difficulty Somewhat difficult Somewhat difficult Somewhat difficult    Overweight BMI Has been losing weight, trying to eat a healthy diet States that he has lost 3 pounds in the last month    PERTINENT  PMH / PSH: Anxiety, depression, schizophrenia  OBJECTIVE:   BP 136/80   Pulse 93   Ht 6' (1.829 m)   Wt 202 lb 3.2 oz (91.7 kg)   SpO2 97%   BMI 27.42 kg/m   Physical Exam:  General: 59 y.o. male in NAD Lungs: Breathing comfortably on room air Skin: warm and dry Neuro: A and O x3 Psych: Mood and affect appropriate for circumstance,  loud speech, denies SI/HI, thought process linear and logical   ASSESSMENT/PLAN:   Penile lesion Following with urology.  Has appointment tomorrow for removal.  Hematuria Found by work-up by neurologist.  Patient reports he has had an ultrasound and a CT scan for this.  He is not sure of the results of this at this time.  He is continuing to follow with urology regarding this.  Migraine with aura and with status migrainosus, not intractable Improving.  Patient has had 2 since being seen last month.  Improved with sumatriptan.  Has an appointment with neurology at the end of the month.  Also seen by an eye doctor and was told that they did not think this was affecting his headaches.  Advised to follow-up with them in 1 year.  Continue sumatriptan hand, follow-up with neurology.  Hyperlipidemia Patient had adequate response.  Continue Crestor 10 mg.  Mood disturbance Patient's PHQ-9 and GAD-7 are unchanged.  He denies SI/HI.  He did mark a one is answer to question 9 on PHQ-9, but denied having thoughts of being better off dead or hurting himself or someone else.  He does not have access to firearms, has a prior arrest, has no military background.  He does have a history of schizophrenia, is not currently  on any medication.  Again encourage patient to follow-up with a psychiatrist, he has a list at home.  Overweight (BMI 25.0-29.9) Congratulated patient on healthy diet.  Continue to encourage.     Unknown Jim, DO [facility]

## 2020-01-01 NOTE — Assessment & Plan Note (Signed)
Patient's PHQ-9 and GAD-7 are unchanged.  He denies SI/HI.  He did mark a one is answer to question 9 on PHQ-9, but denied having thoughts of being better off dead or hurting himself or someone else.  He does not have access to firearms, has a prior arrest, has no military background.  He does have a history of schizophrenia, is not currently on any medication.  Again encourage patient to follow-up with a psychiatrist, he has a list at home.

## 2020-01-01 NOTE — Assessment & Plan Note (Signed)
Following with urology.  Has appointment tomorrow for removal.

## 2020-01-01 NOTE — Assessment & Plan Note (Addendum)
Improving.  Patient has had 2 since being seen last month.  Improved with sumatriptan.  Has an appointment with neurology at the end of the month.  Also seen by an eye doctor and was told that they did not think this was affecting his headaches.  Advised to follow-up with them in 1 year.  Continue sumatriptan hand, follow-up with neurology.

## 2020-01-07 ENCOUNTER — Other Ambulatory Visit: Payer: Self-pay

## 2020-01-07 DIAGNOSIS — G43101 Migraine with aura, not intractable, with status migrainosus: Secondary | ICD-10-CM

## 2020-01-07 MED ORDER — SUMATRIPTAN SUCCINATE 50 MG PO TABS: 50.0000 mg | ORAL_TABLET | ORAL | 0 refills | Status: DC | PRN

## 2020-02-05 NOTE — Progress Notes (Signed)
NEUROLOGY CONSULTATION NOTE  Ethan Williams MRN: [id number] DOB: 11-24-1960  Referring provider: Luis Abed, DO Primary care provider: Luis Abed, DO  Reason for consult:  migraines  HISTORY OF PRESENT ILLNESS: Ethan Williams is a 59 year old right-handed Caucasian male who presents for migraines.  History supplemented by referring provider's notes.  He started having migraines since 2013.  He starts with fatigue, slurred speech and visual aura with blurred vision, spots, flashes and lines for 30 minutes followed by severe stabbing occipital headache (either side) radiating to the front of head lasting a 1 to 2 hours with sumatriptan, that lingers for a couple of days.  Associated with photophobia and phonophobia but not really nausea or vomiting.  Occurs every 2 weeks.  Coughing aggravates it.  Resting in dark and quiet room helps.    MRI and MRA of brain from 12/14/2011 personally reviewed and was normal. MRI of cervical spine without contrast from 08/19/2018 personally reviewed showed moderately large C6-7 disc extrusion resulting in moderate spinal stenosis and moderate left neural foraminal stenosis, C5-6 disc degeneration and left parcentral protrusion resulting in moderate spinal stenosis and severe left neural foraminal stenosis, and moderate right neural foraminal stenosis at C3-4 and C4-5.  Current NSAIDS:  Advil Current analgesics:  none Current triptans:  Sumatriptan 50mg  Current ergotamine:  none Current anti-emetic:  none Current muscle relaxants:  none Current anti-anxiolytic:  none Current sleep aide:  none Current Antihypertensive medications:  none Current Antidepressant medications:  none Current Anticonvulsant medications:  none Current anti-CGRP:  none Current Vitamins/Herbal/Supplements:  MVI Current Antihistamines/Decongestants:  none Other therapy:  none Hormone/birth control:  none  Past NSAIDS/steroids:  naproxen Past analgesics:   Tylenol; Fiorciet; tramadol Past abortive triptans:  none Past abortive ergotamine:  none Past muscle relaxants:  baclofen Past anti-emetic:  none Past antihypertensive medications:  none Past antidepressant medications:  none Past anticonvulsant medications:  gabapentin Past anti-CGRP:  none Past vitamins/Herbal/Supplements:  none Other past therapies:  Coffee  Caffeine:  16 oz coffee daily and sometimes 1 to cups in evening. Diet:  Does not drink much water.  A little bit of soda. Exercise:  Not routine Depression:  no; Anxiety:  no Other pain:  Bilateral shoulder pain, neck pain in morning Sleep hygiene:  okay Family history of headache:  no  History of concussion History of cervical spinal stenosis s/p fusion in February 2020.   PAST MEDICAL HISTORY: Past Medical History:  Diagnosis Date  . Anginal pain (HCC)   . Anxiety   . Arthritis    "right shoulder" (06/06/2012), "finger joints"  . Daily headache    "here lately they've been daily" (06/06/2012), no current problems per patient 06/16/19  . Depression   . Dysrhythmia    "palpitations" (06/06/2012)  . External hemorrhoid, bleeding   . GERD (gastroesophageal reflux disease)   . Heart murmur    "as a child" (06/06/2012), no problems  . Hyperlipidemia   . Migraines    "treated for them in the last 6 months (06/06/2012), none since 2013  . Pneumonia 2012; 06/06/2012  . Seasonal allergies    "outdoor; pollen, grass" (06/06/2012)  . Shortness of breath 06/06/2012   "at rest; lying down; w/exertion", no current problems per patient 10/12/200  . Substance abuse (HCC)    hx - clean since 2010 per patient 06/16/19    PAST SURGICAL HISTORY: Past Surgical History:  Procedure Laterality Date  . ANTERIOR CERVICAL DECOMP/DISCECTOMY FUSION N/A 10/23/2018   Procedure: ANTERIOR CERVICAL DECOMPRESSION  FUSION, CERVICAL 5-6, CERVICAL 6-7 , WITH POSSIBLE C6-7 CORPECTOMY;  Surgeon: Estill Bamberg, MD;  Location: [facility] OR;  Service:  Orthopedics;  Laterality: N/A;  . APPENDECTOMY  2001  . POSTERIOR CERVICAL FUSION/FORAMINOTOMY Bilateral 10/24/2018   Procedure: CERVICAL FIVE-SEVEN POSTERIOR SPINAL FUSION WITH INSTRUMENTATION AND ALLOGRAFT;  Surgeon: Estill Bamberg, MD;  Location: [facility] OR;  Service: Orthopedics;  Laterality: Bilateral;  . SHOULDER SURGERY Right   . WISDOM TOOTH EXTRACTION      MEDICATIONS: Current Outpatient Medications on File Prior to Visit  Medication Sig Dispense Refill  . acetaminophen (TYLENOL) 500 MG tablet Take 1,000 mg by mouth every 6 (six) hours as needed for mild pain, fever or headache.    . albuterol (VENTOLIN HFA) 108 (90 Base) MCG/ACT inhaler Inhale 2 puffs into the lungs every 4 (four) hours as needed for wheezing or shortness of breath. 18 g 3  . calcium carbonate (TUMS - DOSED IN MG ELEMENTAL CALCIUM) 500 MG chewable tablet Chew 1 tablet by mouth daily as needed for indigestion or heartburn.    . famotidine (PEPCID) 20 MG tablet Take 1 tablet (20 mg total) by mouth 2 (two) times daily. 60 tablet 3  . ibuprofen (ADVIL) 200 MG tablet Take 400 mg by mouth every 6 (six) hours as needed.    . Multiple Vitamin (MULTIVITAMIN WITH MINERALS) TABS tablet Take 1 tablet by mouth daily.    . rosuvastatin (CRESTOR) 10 MG tablet Take 1 tablet (10 mg total) by mouth daily. 90 tablet 3  . SUMAtriptan (IMITREX) 50 MG tablet Take 1 tablet (50 mg total) by mouth every 2 (two) hours as needed for migraine. May repeat in 2 hours if headache persists or recurs. 10 tablet 0  . tiotropium (SPIRIVA HANDIHALER) 18 MCG inhalation capsule Place 1 capsule (18 mcg total) into inhaler and inhale daily. 30 capsule 12   No current facility-administered medications on file prior to visit.    ALLERGIES: No Known Allergies  FAMILY HISTORY: Family History  Problem Relation Age of Onset  . Cancer Father   . Colon cancer Neg Hx   . Rectal cancer Neg Hx   . Prostate cancer Neg Hx    SOCIAL HISTORY: Social History    Socioeconomic History  . Marital status: Single    Spouse name: Not on file  . Number of children: Not on file  . Years of education: Not on file  . Highest education level: Not on file  Occupational History  . Occupation: unemployed  Tobacco Use  . Smoking status: Current Every Day Smoker    Packs/day: 1.00    Years: 45.00    Pack years: 45.00    Types: Cigarettes  . Smokeless tobacco: Never Used  Substance and Sexual Activity  . Alcohol use: No    Comment: 06/06/2012 "History of abuse ; sober since 07/2009"  . 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 Sexual activity: Yes  Other Topics Concern  . Not on file  Social History Narrative   Lives with Parents   Unemployeed   Social Determinants of Health   Financial Resource Strain:   . Difficulty of Paying Living Expenses:   Food Insecurity:   . Worried About Programme researcher, broadcasting/film/video in the Last Year:   . Barista in the Last Year:   [employer]  Needs:   . Lack of Transportation (Medical):   Marland Kitchen Lack of Transportation (Non-Medical):   Physical Activity:   . Days of Exercise per Week:   . Minutes of Exercise per Session:   Stress:   . Feeling of Stress :   Social Connections:   . Frequency of Communication with Friends and Family:   . Frequency of Social Gatherings with Friends and Family:   . Attends Religious Services:   . Active Member of Clubs or Organizations:   . Attends Archivist Meetings:   Marland Kitchen Marital Status:   Intimate Partner Violence:   . Fear of Current or Ex-Partner:   . Emotionally Abused:   Marland Kitchen Physically Abused:   . Sexually Abused:     PHYSICAL EXAM: Blood pressure 122/78, pulse 70, height 5\' 11"  (1.803 m), weight 204 lb 9.6 oz (92.8 kg), SpO2 95 %. General: No acute distress.  Patient appears well-groomed.   Head:   Normocephalic/atraumatic Eyes:  fundi examined but not visualized Neck: supple, no paraspinal tenderness, full range of motion Back: No paraspinal tenderness Heart: regular rate and rhythm Lungs: Clear to auscultation bilaterally. Vascular: No carotid bruits. Neurological Exam: Mental status: alert and oriented to person, place, and time, recent and remote memory intact, fund of knowledge intact, attention and concentration intact, speech fluent and not dysarthric, language intact. Cranial nerves: CN I: not tested CN II: pupils equal, round and reactive to light, visual fields intact CN III, IV, VI:  full range of motion, no nystagmus, no ptosis CN V: facial sensation intact CN VII: upper and lower face symmetric CN VIII: hearing intact CN IX, X: gag intact, uvula midline CN XI: sternocleidomastoid and trapezius muscles intact CN XII: tongue midline Bulk & Tone: normal, no fasciculations. Motor:  5/5 throughout  Sensation:  temperature and vibration sensation intact. Deep Tendon Reflexes:  2+ throughout, toes downgoing.  Finger to nose testing:  Without dysmetria.   Heel to shin:  Without dysmetria.   Gait:  Normal station and stride.  Able to turn. Romberg negative.  IMPRESSION: Migraine with aura, without status migrainosus, not intractable.  Manageable at this time, averaging no more than twice a month and responds to sumatriptan.  PLAN: 1.  For abortive therapy, sumatriptan as prescribed. 2.  Limit use of pain relievers to no more than 2 days out of week to prevent risk of rebound or medication-overuse headache. 3.  Keep headache diary 4.  Exercise, hydration, caffeine cessation, sleep hygiene, monitor for and avoid triggers 5.  Consider:  magnesium citrate 400mg  daily, riboflavin 400mg  daily, and coenzyme Q10 100mg  three times daily 6. Follow up as needed.   Thank you for allowing me to take part in the care of this patient.  Metta Clines, DO  CC: [place] Constable,  DO

## 2020-02-09 ENCOUNTER — Ambulatory Visit: Payer: Medicaid Other | Admitting: Neurology

## 2020-02-09 ENCOUNTER — Other Ambulatory Visit: Payer: Self-pay

## 2020-02-09 ENCOUNTER — Encounter: Payer: Self-pay | Admitting: Neurology

## 2020-02-09 VITALS — BP 122/78 | HR 70 | Ht 71.0 in | Wt 204.6 lb

## 2020-02-09 DIAGNOSIS — G43101 Migraine with aura, not intractable, with status migrainosus: Secondary | ICD-10-CM

## 2020-02-09 NOTE — Patient Instructions (Signed)
1. For abortive therapy, continue sumatriptan as prescribed. 2.  Limit use of pain relievers to no more than 2 days out of week to prevent risk of rebound or medication-overuse headache. 3.  Keep headache diary 4.  Exercise, hydration, caffeine cessation, sleep hygiene, monitor for and avoid triggers 5.  Consider:  magnesium citrate 400mg  daily, riboflavin 400mg  daily, and coenzyme Q10 100mg  three times daily 6. Always keep in mind that currently taking a hormone or birth control may be a possible trigger or aggravating factor for migraine.   Migraine Headache A migraine headache is a very strong throbbing pain on one side or both sides of your head. This type of headache can also cause other symptoms. It can last from 4 hours to 3 days. Talk with your doctor about what things may bring on (trigger) this condition. What are the causes? The exact cause of this condition is not known. This condition may be triggered or caused by:  Drinking alcohol.  Smoking.  Taking medicines, such as: ? Medicine used to treat chest pain (nitroglycerin). ? Birth control pills. ? Estrogen. ? Some blood pressure medicines.  Eating or drinking certain products.  Doing physical activity. Other things that may trigger a migraine headache include:  Having a menstrual period.  Pregnancy.  Hunger.  Stress.  Not getting enough sleep or getting too much sleep.  Weather changes.  Tiredness (fatigue). What increases the risk?  Being 22-53 years old.  Being male.  Having a family history of migraine headaches.  Being Caucasian.  Having depression or anxiety.  Being very overweight. What are the signs or symptoms?  A throbbing pain. This pain may: ? Happen in any area of the head, such as on one side or both sides. ? Make it hard to do daily activities. ? Get worse with physical activity. ? Get worse around bright lights or loud noises.  Other symptoms may include: ? Feeling sick to  your stomach (nauseous). ? Vomiting. ? Dizziness. ? Being sensitive to bright lights, loud noises, or smells.  Before you get a migraine headache, you may get warning signs (an aura). An aura may include: ? Seeing flashing lights or having blind spots. ? Seeing bright spots, halos, or zigzag lines. ? Having tunnel vision or blurred vision. ? Having numbness or a tingling feeling. ? Having trouble talking. ? Having weak muscles.  Some people have symptoms after a migraine headache (postdromal phase), such as: ? Tiredness. ? Trouble thinking (concentrating). How is this treated?  Taking medicines that: ? Relieve pain. ? Relieve the feeling of being sick to your stomach. ? Prevent migraine headaches.  Treatment may also include: ? Having acupuncture. ? Avoiding foods that bring on migraine headaches. ? Learning ways to control your body functions (biofeedback). ? Therapy to help you know and deal with negative thoughts (cognitive behavioral therapy). Follow these instructions at home: Medicines  Take over-the-counter and prescription medicines only as told by your doctor.  Ask your doctor if the medicine prescribed to you: ? Requires you to avoid driving or using heavy machinery. ? Can cause trouble pooping (constipation). You may need to take these steps to prevent or treat trouble pooping:  Drink enough fluid to keep your pee (urine) pale yellow.  Take over-the-counter or prescription medicines.  Eat foods that are high in fiber. These include beans, whole grains, and fresh fruits and vegetables.  Limit foods that are high in fat and sugar. These include fried or sweet foods. Lifestyle  Do not drink alcohol.  Do not use any products that contain nicotine or tobacco, such as cigarettes, e-cigarettes, and chewing tobacco. If you need help quitting, ask your doctor.  Get at least 8 hours of sleep every night.  Limit and deal with stress. General instructions       Keep a journal to find out what may bring on your migraine headaches. For example, write down: ? What you eat and drink. ? How much sleep you get. ? Any change in what you eat or drink. ? Any change in your medicines.  If you have a migraine headache: ? Avoid things that make your symptoms worse, such as bright lights. ? It may help to lie down in a dark, quiet room. ? Do not drive or use heavy machinery. ? Ask your doctor what activities are safe for you.  Keep all follow-up visits as told by your doctor. This is important. Contact a doctor if:  You get a migraine headache that is different or worse than others you have had.  You have more than 15 headache days in one month. Get help right away if:  Your migraine headache gets very bad.  Your migraine headache lasts longer than 72 hours.  You have a fever.  You have a stiff neck.  You have trouble seeing.  Your muscles feel weak or like you cannot control them.  You start to lose your balance a lot.  You start to have trouble walking.  You pass out (faint).  You have a seizure. Summary  A migraine headache is a very strong throbbing pain on one side or both sides of your head. These headaches can also cause other symptoms.  This condition may be treated with medicines and changes to your lifestyle.  Keep a journal to find out what may bring on your migraine headaches.  Contact a doctor if you get a migraine headache that is different or worse than others you have had.  Contact your doctor if you have more than 15 headache days in a month. This information is not intended to replace advice given to you by your health care provider. Make sure you discuss any questions you have with your health care provider. Document Revised: 12/13/2018 Document Reviewed: 10/03/2018 Elsevier Patient Education  2020 [employer].

## 2020-04-07 ENCOUNTER — Other Ambulatory Visit: Payer: Self-pay

## 2020-04-07 DIAGNOSIS — G43101 Migraine with aura, not intractable, with status migrainosus: Secondary | ICD-10-CM

## 2020-04-08 MED ORDER — SUMATRIPTAN SUCCINATE 50 MG PO TABS: 50.0000 mg | ORAL_TABLET | ORAL | 0 refills | Status: DC | PRN

## 2020-05-11 ENCOUNTER — Other Ambulatory Visit: Payer: Self-pay | Admitting: Family Medicine

## 2020-05-11 DIAGNOSIS — G43101 Migraine with aura, not intractable, with status migrainosus: Secondary | ICD-10-CM

## 2020-05-12 ENCOUNTER — Other Ambulatory Visit: Payer: Self-pay | Admitting: *Deleted

## 2020-05-12 MED ORDER — FAMOTIDINE 20 MG PO TABS: 20.0000 mg | ORAL_TABLET | Freq: Two times a day (BID) | ORAL | 3 refills | Status: DC

## 2020-07-12 ENCOUNTER — Other Ambulatory Visit: Payer: Self-pay

## 2020-07-12 ENCOUNTER — Ambulatory Visit (INDEPENDENT_AMBULATORY_CARE_PROVIDER_SITE_OTHER): Payer: Medicaid Other | Admitting: Family Medicine

## 2020-07-12 ENCOUNTER — Ambulatory Visit: Payer: Medicaid Other | Admitting: Family Medicine

## 2020-07-12 ENCOUNTER — Encounter: Payer: Self-pay | Admitting: Family Medicine

## 2020-07-12 VITALS — BP 138/92 | HR 85 | Ht 72.0 in | Wt 207.8 lb

## 2020-07-12 DIAGNOSIS — G43101 Migraine with aura, not intractable, with status migrainosus: Secondary | ICD-10-CM

## 2020-07-12 DIAGNOSIS — S39012A Strain of muscle, fascia and tendon of lower back, initial encounter: Secondary | ICD-10-CM | POA: Diagnosis present

## 2020-07-12 MED ORDER — SUMATRIPTAN SUCCINATE 50 MG PO TABS: ORAL_TABLET | ORAL | 0 refills | Status: DC

## 2020-07-12 MED ORDER — BACLOFEN 10 MG PO TABS: 5.0000 mg | ORAL_TABLET | Freq: Three times a day (TID) | ORAL | 0 refills | Status: AC

## 2020-07-12 MED ORDER — NAPROXEN 500 MG PO TABS: 500.0000 mg | ORAL_TABLET | Freq: Two times a day (BID) | ORAL | 0 refills | Status: DC

## 2020-07-12 NOTE — Patient Instructions (Addendum)
It was great seeing you today!  Follow up with your Dr. Sandi Carne as directed.  As discussed take Tylenol 1000 mg (2-500 mg tablets) as needed.   Stop by the pharmacy to pick up your prescriptions.  Do not take any other NSAIDs (Motrin, Alleve, Advil, Ibuprofen, Naproxen sodium, ect.Marland Kitchen) while taking Meloxicaim. Take your first dose of Baclofen (muscle relaxer) tonight at bedtime. Do not drive or operate heavy machinery while taking this medication.   Watch for worsening symptoms such as an increasing weakness or loss of sensation legs, increasing pain and the loss of bladder or bowel function. Should any of these occur, go to the emergency department immediately.   If pain does not resolve or gets worse follow up in the clinic.    If you have questions or concerns please do not hesitate to call at [phone].  Dr. [facility]   Back Injury Prevention Back injuries can be very painful. They can also be difficult to heal. After having one back injury, you are more likely to have another one again. It is important to learn how to avoid injuring or re-injuring your back. The following tips can help you to prevent a back injury. What actions can I take to prevent back injuries? Changes in your diet Talk with your doctor about what to eat. Some foods can make the bones strong.  Talk with your doctor about how much calcium and vitamin D you need each day. These nutrients help to prevent weakening of the bones (osteoporosis).  Eat foods that have calcium. These include: ? Dairy products. ? Green leafy vegetables. ? Food and drinks that have had calcium added to them (fortified).  Eat foods that have vitamin D. These include: ? Milk. ? Food and drinks that have had vitamin D added to them.  Take other supplements and vitamins only as told by your doctor. Physical fitness Physical fitness makes your bones and muscles strong. It also improves your balance and  strength.  Exercise for 30 minutes per day on most days of the week, or as told by your doctor. Make sure to: ? Do aerobic exercises, such as walking, jogging, biking, or swimming. ? Do exercises that increase balance and strength, such as tai chi and yoga. ? Do stretching exercises. This helps with flexibility. ? Develop strong belly (abdominal) muscles. Your belly muscles help to support your back.  Stay at a healthy weight. This lowers your risk of a back injury. Good posture        Prevent back injuries by developing and maintaining a good posture. To do this:  Sit up straight and stand up straight. Avoid leaning forward when you sit or hunching over when you stand.  Choose chairs that have good low-back (lumbar) support.  If you work at a desk: ? Sit close to it so you do not need to lean over. ? Keep your chin tucked in. ? Keep your neck drawn back. ? Keep your elbows bent so that your arms make a corner (right angle).  When you drive: ? Sit high and close to the steering wheel. Add a low-back support to your car seat, if needed. ? Take breaks every hour if you are driving for long periods of time.  Avoid sitting or standing in one position for very long. Take breaks to get up, stretch, and walk around at least once every hour.  Sleep on your side with your knees slightly bent, or sleep on  your back with a pillow under your knees.  Lifting, twisting, and reaching   Heavy lifting ? Avoid heavy lifting, especially lifting over and over again. If you must do heavy lifting:  Stretch before lifting.  Work slowly.  Rest between lifts.  Use a tool such as a cart or a dolly to move objects if one is available.  Make several small trips instead of carrying one heavy load.  Ask for help when you need it, especially when moving big objects. ? Follow these steps when lifting:  Stand with your feet shoulder-width apart.  Get as close to the object as you can. Do not  pick up a heavy object that is far from your body.  Use handles or lifting straps if they are available.  Bend at your knees. Squat down, but keep your heels off the floor.  Keep your shoulders back. Keep your chin tucked in. Keep your back straight.  Lift the object slowly while you tighten the muscles in your legs, belly, and bottom. Keep the object as close to the center of your body as possible. ? Follow these steps when putting down a heavy load:  Stand with your feet shoulder-width apart.  Lower the object slowly while you tighten the muscles in your legs, belly, and bottom. Keep the object as close to the center of your body as possible.  Keep your shoulders back. Keep your chin tucked in. Keep your back straight.  Bend at your knees. Squat down, but keep your heels off the floor.  Use handles or lifting straps if they are available.  Twisting and reaching ? Avoid lifting heavy objects above your waist. ? Do not twist at your waist while you are lifting or carrying a load. If you need to turn, move your feet. ? Do not bend over without bending at your knees. ? Avoid reaching over your head, across a table, or for an object on a high surface. Other things to do   Avoid wet floors and icy ground. Keep sidewalks clear of ice to prevent falls.  Do not sleep on a mattress that is too soft or too hard.  Store heavier objects on shelves at waist level.  Store lighter objects on lower or higher shelves.  Find ways to lower your stress, such as: ? Exercise. ? Massage. ? Relaxation techniques.  Talk with your doctor if you feel anxious or depressed. These conditions can make back pain worse.  Wear flat heel shoes with cushioned soles.  Use both shoulder straps when carrying a backpack.  Do not use any products that contain nicotine or tobacco, such as cigarettes and e-cigarettes. If you need help quitting, ask your doctor. Summary  Back injuries can be very painful and  difficult to heal.  You can keep your back healthy by making certain changes. These include eating foods that make bones strong, working on being physically fit, developing a good posture, and lifting heavy objects in a safe way. This information is not intended to replace advice given to you by your health care provider. Make sure you discuss any questions you have with your health care provider. Document Revised: 05/14/2019 Document Reviewed: 10/12/2017 Elsevier Patient Education  [employer].
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
## 2020-07-14 DIAGNOSIS — S39012A Strain of muscle, fascia and tendon of lower back, initial encounter: Secondary | ICD-10-CM | POA: Insufficient documentation

## 2020-07-14 NOTE — Assessment & Plan Note (Signed)
Discussed with patient gradually returning to normal activities, as tolerated. Pt to continue ordinary activities within the limits permitted by pain. Will prescribe Naproxen sodium and muscle relaxer for pain relief.  Advised patient to avoid other NSAIDs while taking this medication. Counseled patient on red flag symptoms and when to seek immediate care. No red flags suggesting cauda equina syndrome or progressive major motor weakness. Patient to return if symptoms do not improve with conservative treatment.  Consider imaging at that time.

## 2020-07-14 NOTE — Assessment & Plan Note (Signed)
Refilled Imitrex. Advised pt to follow up with neurology

## 2020-08-31 ENCOUNTER — Other Ambulatory Visit: Payer: Self-pay | Admitting: Family Medicine

## 2020-08-31 DIAGNOSIS — G43101 Migraine with aura, not intractable, with status migrainosus: Secondary | ICD-10-CM

## 2020-09-15 ENCOUNTER — Other Ambulatory Visit: Payer: Self-pay

## 2020-09-15 ENCOUNTER — Telehealth: Payer: Self-pay | Admitting: Family Medicine

## 2020-09-15 MED ORDER — FAMOTIDINE 20 MG PO TABS: 20.0000 mg | ORAL_TABLET | Freq: Two times a day (BID) | ORAL | 1 refills | Status: DC

## 2020-09-15 NOTE — Telephone Encounter (Signed)
Refill sent.

## 2020-09-15 NOTE — Telephone Encounter (Signed)
Patient is calling in to get a refill on his Pepcid as soon as possible he went to pharmacy for a refill on it and they sent request to [place] on the wrong medication. He is in desperate need of it. Thanks

## 2020-09-15 NOTE — Telephone Encounter (Signed)
Refill request sent to PCP. Ethan Williams Ethan Williams, CMA  

## 2020-11-01 ENCOUNTER — Other Ambulatory Visit: Payer: Self-pay | Admitting: Family Medicine

## 2020-11-01 DIAGNOSIS — G43101 Migraine with aura, not intractable, with status migrainosus: Secondary | ICD-10-CM

## 2020-11-10 NOTE — Progress Notes (Signed)
    SUBJECTIVE:   CHIEF COMPLAINT / HPI:   Possible COPD, Tobacco Abuse Patient did not have formal PFTs in the setting of the COVID-19 pandemic, they were not performed as an outpatient He was started on Spiriva on 11/12/2019, hasn't been using Breathing has been okay Still smoking, 0.5ppd Wants to quit, but not today  Reports a prior history of COPD, but no PFTs in chart  Hyperlipidemia Current regimen: Crestor 10 mg daily, takes it occasionally Last lipid panel 05/20/2019, LDL was 135 at that time, had a direct LDL in October 2020 which improved to 81 after initiation of therapy Denies chest pain, worsening of breathing, leg cramping  Mood disturbance follow-up History of anxiety, depression, reported history of schizophrenia Not currently on any medications or under the care of a psychiatrist  Doesn't feel like he has needed to see anyone He is doing okay and can cope  Migraines Has about 2 a month Does well with sumitriptan No changes  COVID vaccination, has not gotten and declines Flu Shot declined Needs a colonoscopy, wants FIT testing  PERTINENT  PMH / PSH: Migraine, BPH, erectile dysfunction, tobacco abuse, hyperlipidemia, new disturbance  OBJECTIVE:   BP (!) 114/91   Pulse 82   Ht 6' (1.829 m)   Wt 207 lb (93.9 kg)   SpO2 97%   BMI 28.07 kg/m    Physical Exam:  General: 60 y.o. male in NAD Cardio: RRR no m/r/g Lungs: CTAB, no wheezing, no rhonchi, no crackles, no IWOB on RA Skin: warm and dry Extremities: No edema Psych: flat affect, thought process linear and logical, appropriate dress, decent insight, no SI, no HI   ASSESSMENT/PLAN:   Shortness of breath Doing well from the standpoint.  He reports a prior history of COPD diagnosis, but none found in the chart and no PFTs.  Spoke with Dr. Raymondo Band who reports that they are not currently doing PFT testing on patients who are not vaccinated for Covid.  Patient was made aware of this.  He will call to  schedule if his status changes.  For now, he can continue to use albuterol as needed.  Hyperlipidemia Takes Crestor rarely, encouraged that he continues to take it on a daily basis.  He will come back for a lipid panel as he is not ready to have this done today.  Mood disturbance Stable.  No SI/HI.  Not currently following with a psychiatrist and declines at this time.  Continue to monitor closely.  Migraine with aura and with status migrainosus, not intractable Doing well.  No changes in his headaches.  Continue with sumatriptan as needed.  He can come back if migraines increase in frequency and consider controller at that time.  Colon cancer screening He declines colonoscopy.  I FOBT ordered today.  He will bring this back when he comes in for lab work.     Unknown Jim, DO [facility]

## 2020-11-11 ENCOUNTER — Other Ambulatory Visit: Payer: Self-pay

## 2020-11-11 ENCOUNTER — Encounter: Payer: Self-pay | Admitting: Family Medicine

## 2020-11-11 ENCOUNTER — Ambulatory Visit (INDEPENDENT_AMBULATORY_CARE_PROVIDER_SITE_OTHER): Payer: Medicaid Other | Admitting: Family Medicine

## 2020-11-11 VITALS — BP 114/91 | HR 82 | Ht 72.0 in | Wt 207.0 lb

## 2020-11-11 DIAGNOSIS — G43101 Migraine with aura, not intractable, with status migrainosus: Secondary | ICD-10-CM

## 2020-11-11 DIAGNOSIS — E785 Hyperlipidemia, unspecified: Secondary | ICD-10-CM

## 2020-11-11 DIAGNOSIS — R4586 Emotional lability: Secondary | ICD-10-CM

## 2020-11-11 DIAGNOSIS — Z1211 Encounter for screening for malignant neoplasm of colon: Secondary | ICD-10-CM | POA: Diagnosis not present

## 2020-11-11 DIAGNOSIS — R0602 Shortness of breath: Secondary | ICD-10-CM | POA: Diagnosis not present

## 2020-11-11 NOTE — Assessment & Plan Note (Signed)
Takes Crestor rarely, encouraged that he continues to take it on a daily basis.  He will come back for a lipid panel as he is not ready to have this done today.

## 2020-11-11 NOTE — Assessment & Plan Note (Signed)
Doing well from the standpoint.  He reports a prior history of COPD diagnosis, but none found in the chart and no PFTs.  Spoke with Dr. Raymondo Band who reports that they are not currently doing PFT testing on patients who are not vaccinated for Covid.  Patient was made aware of this.  He will call to schedule if his status changes.  For now, he can continue to use albuterol as needed.

## 2020-11-11 NOTE — Assessment & Plan Note (Signed)
He declines colonoscopy.  I FOBT ordered today.  He will bring this back when he comes in for lab work.

## 2020-11-11 NOTE — Assessment & Plan Note (Signed)
Doing well.  No changes in his headaches.  Continue with sumatriptan as needed.  He can come back if migraines increase in frequency and consider controller at that time.

## 2020-11-11 NOTE — Assessment & Plan Note (Signed)
Stable.  No SI/HI.  Not currently following with a psychiatrist and declines at this time.  Continue to monitor closely.

## 2020-11-11 NOTE — Patient Instructions (Signed)
Thank you for coming to see me today. It was a pleasure. Today we talked about:   Come back for blood work at [facility].  Bring back your stool test at the same time.  Please follow-up with new PCP in 6 months or sooner as needed.  If you have any questions or concerns, please do not hesitate to call the office at [phone].  Best,   Luis Abed, DO

## 2020-11-16 ENCOUNTER — Other Ambulatory Visit: Payer: Self-pay | Admitting: Family Medicine

## 2020-12-30 ENCOUNTER — Other Ambulatory Visit: Payer: Self-pay | Admitting: Family Medicine

## 2020-12-30 DIAGNOSIS — G43101 Migraine with aura, not intractable, with status migrainosus: Secondary | ICD-10-CM

## 2021-01-17 ENCOUNTER — Other Ambulatory Visit: Payer: Self-pay | Admitting: Family Medicine

## 2021-02-01 ENCOUNTER — Telehealth: Payer: Self-pay | Admitting: Neurology

## 2021-02-01 NOTE — Telephone Encounter (Signed)
I need to really see him to provide any recommendation since this sounds like an acute issue.  He is on the waitlist which is good.  In the meantime, he should follow up with his PCP.  At his visit with her in March, she said to follow up if he has worsening migraines.

## 2021-02-01 NOTE — Telephone Encounter (Signed)
Pt is having more migraines and is taking the medication sumatriptan. He states that the migraine is lasting for days  Please call   Patient has a follow up 06-14-21 and is on the wait list

## 2021-02-03 NOTE — Telephone Encounter (Signed)
LM with pt mother to call the office back.

## 2021-02-05 LAB — FECAL OCCULT BLOOD, IMMUNOCHEMICAL: Fecal Occult Bld: NEGATIVE

## 2021-02-07 ENCOUNTER — Encounter: Payer: Self-pay | Admitting: Family Medicine

## 2021-02-22 NOTE — Progress Notes (Signed)
NEUROLOGY FOLLOW UP OFFICE NOTE  Ethan Williams [id number]  Assessment/Plan:   Migraine with aura, without status migrainosus, not intractable - now with worsening migraines and has residual visual aura.  1.MRI of brain with and without contrast 2.Migraine prevention:  start topiramate 25mg  at bedtime for one week, then increase to 50mg  at bedtime.  We can increase to 100mg  at bedtime in 5 weeks if needed. 3. Migraine rescue:  Stop sumatriptan.  He will try rizatriptan 10mg . 4.Limit use of pain relievers to no more than 2 days out of week to prevent risk of rebound or medication-overuse headache. 5.  Keep headache diary 6.  Follow up 6 months.  Subjective:  Ethan Williams is a 60 year old right-handed Caucasian male who follows up for migraines.  UPDATE: Migraines had continued to be manageable, lasting 1-2 hours and occurring about 2 a month.  Over past year, reports increased migraines, lasting 1-2 hours and has been occurring once a week but will linger for 2-3 days.  2 days ago, he had a visual aura described as blurred vision and crescent shape in vision which did not progress to a headache.  It lasted a few days.  When he shuts his eye, still sees the crescent shape in his left eye.  This is new.     Current NSAIDS:  Advil Current analgesics:  none Current triptans:  Sumatriptan 50mg  Current ergotamine:  none Current anti-emetic:  none Current muscle relaxants:  none Current anti-anxiolytic:  none Current sleep aide:  none Current Antihypertensive medications:  none Current Antidepressant medications:  none Current Anticonvulsant medications:  none Current anti-CGRP:  none Current Vitamins/Herbal/Supplements:  MVI Current Antihistamines/Decongestants:  none Other therapy:  none Hormone/birth control:  none  Caffeine:  16 oz coffee daily and sometimes 1 to cups in evening. Diet:  Does not drink much water.  A little bit of soda. Exercise:  Not routine Depression:   no; Anxiety:  no Other pain:  Bilateral shoulder pain, neck pain in morning Sleep hygiene:  okay  HISTORY:  He started having migraines since 2013.  He starts with fatigue, slurred speech and visual aura with blurred vision, spots, flashes and lines for 30 minutes followed by severe stabbing occipital headache (either side) radiating to the front of head lasting a 1 to 2 hours with sumatriptan, that lingers for a couple of days.  Associated with photophobia and phonophobia but not really nausea or vomiting.  Residual headache for 2 to 3 days.  Occurs every 2 weeks.  Coughing aggravates it.  Resting in dark and quiet room helps.     MRI and MRA of brain from 12/14/2011 were normal. MRI of cervical spine without contrast from 08/19/2018 showed moderately large C6-7 disc extrusion resulting in moderate spinal stenosis and moderate left neural foraminal stenosis, C5-6 disc degeneration and left parcentral protrusion resulting in moderate spinal stenosis and severe left neural foraminal stenosis, and moderate right neural foraminal stenosis at C3-4 and C4-5.  Underwent ACDF 5-6 and 6-7 in 2020.  Still has residual numbness in left arm and sometimes perioral numbness and numbness in the jaw bilateral.   Past NSAIDS/steroids:  naproxen Past analgesics:  Tylenol; Fiorciet; tramadol Past abortive triptans:  none Past abortive ergotamine:  none Past muscle relaxants:  baclofen Past anti-emetic:  none Past antihypertensive medications:  none Past antidepressant medications:  none Past anticonvulsant medications:  gabapentin Past anti-CGRP:  none Past vitamins/Herbal/Supplements:  none Other past therapies:  Coffee    Family history  of headache:  no   History of concussion History of cervical spinal stenosis s/p fusion in February 2020.  PAST MEDICAL HISTORY: Past Medical History:  Diagnosis Date   Anginal pain (HCC)    Anxiety    Arthritis    "right shoulder" (06/06/2012), "finger joints"    Daily headache    "here lately they've been daily" (06/06/2012), no current problems per patient 06/16/19   Depression    Dysrhythmia    "palpitations" (06/06/2012)   External hemorrhoid, bleeding    GERD (gastroesophageal reflux disease)    Heart murmur    "as a child" (06/06/2012), no problems   Hyperlipidemia    Migraines    "treated for them in the last 6 months (06/06/2012), none since 2013   Pneumonia 2012; 06/06/2012   Seasonal allergies    "outdoor; pollen, grass" (06/06/2012)   Shortness of breath 06/06/2012   "at rest; lying down; w/exertion", no current problems per patient 10/12/200   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     albuterol (VENTOLIN HFA) 108 (90 Base) MCG/ACT inhaler Inhale 2 puffs into the lungs every 4 (four) hours as needed for wheezing or shortness of breath. 18 g 3   calcium carbonate (TUMS - DOSED IN MG ELEMENTAL CALCIUM) 500 MG chewable tablet Chew 1 tablet by mouth daily as needed for indigestion or heartburn.     famotidine (PEPCID) 20 MG tablet TAKE 1 TABLET(20 MG) BY MOUTH TWICE DAILY 60 tablet 1   Multiple Vitamin (MULTIVITAMIN WITH MINERALS) TABS tablet Take 1 tablet by mouth daily.     rosuvastatin (CRESTOR) 10 MG tablet Take 1 tablet (10 mg total) by mouth daily. 90 tablet 3   SUMAtriptan (IMITREX) 50 MG tablet TAKE 1 TABLET BY MOUTH EVERY 2 HOURS AS NEEDED FOR MIGRAINE 10 tablet 0   No current facility-administered medications on file prior to visit.    ALLERGIES: No Known Allergies  FAMILY HISTORY: Family History  Problem Relation Age of Onset   Cancer Father    Colon cancer Neg Hx    Rectal cancer Neg Hx    Prostate cancer Neg Hx       Objective:  Blood pressure (!) 147/88,  pulse 75, height 5\' 11"  (1.803 m), weight 203 lb 12.8 oz (92.4 kg), SpO2 95 %. General: No acute distress.  Patient appears well-groomed.   Head:  Normocephalic/atraumatic Eyes:  Fundi examined but not visualized Neck: supple, no paraspinal tenderness, full range of motion Heart:  Regular rate and rhythm Lungs:  Clear to auscultation bilaterally Back: No paraspinal tenderness Neurological Exam: alert and oriented to person, place, and time.  Speech fluent and not dysarthric, language intact.  CN II-XII intact. Bulk and tone normal, 5-/5 left hand grip, otherwise, muscle strength 5/5 throughout.  Sensation to light touch intact.  Deep tendon reflexes 2+ throughout.  Finger to nose testing intact.  Gait normal, Romberg negative.   , DO  CC: Shon Millet, DO

## 2021-02-23 ENCOUNTER — Encounter: Payer: Self-pay | Admitting: Neurology

## 2021-02-23 ENCOUNTER — Ambulatory Visit: Payer: Medicaid Other | Admitting: Neurology

## 2021-02-23 ENCOUNTER — Other Ambulatory Visit: Payer: Self-pay

## 2021-02-23 ENCOUNTER — Other Ambulatory Visit: Payer: Self-pay | Admitting: Neurology

## 2021-02-23 VITALS — BP 147/88 | HR 75 | Ht 71.0 in | Wt 203.8 lb

## 2021-02-23 DIAGNOSIS — G43101 Migraine with aura, not intractable, with status migrainosus: Secondary | ICD-10-CM

## 2021-02-23 DIAGNOSIS — R519 Headache, unspecified: Secondary | ICD-10-CM | POA: Diagnosis not present

## 2021-02-23 DIAGNOSIS — M4802 Spinal stenosis, cervical region: Secondary | ICD-10-CM

## 2021-02-23 DIAGNOSIS — H539 Unspecified visual disturbance: Secondary | ICD-10-CM

## 2021-02-23 MED ORDER — TOPIRAMATE 50 MG PO TABS: ORAL_TABLET | ORAL | 0 refills | Status: DC

## 2021-02-23 MED ORDER — RIZATRIPTAN BENZOATE 10 MG PO TABS: ORAL_TABLET | ORAL | 5 refills | Status: DC

## 2021-02-23 NOTE — Patient Instructions (Signed)
Start topiramate 50mg  tablet - take 1/2 tablet at bedtime for one week, then increase to 1 tablet at bedtime.  Contact me for refill and update and we can increase dose if needed Stop sumatriptan.  At earliest onset of migraine, take rizatriptan 10mg .  May repeat in 2 hours. Maximum 2 tablets in 24hours. Check MRI of brain with and without contrast Limit use of pain relievers to no more than 2 days out of week to prevent risk of rebound or medication-overuse headache. Keep headache diary Follow up 6 months.

## 2021-03-12 ENCOUNTER — Ambulatory Visit
Admission: RE | Admit: 2021-03-12 | Discharge: 2021-03-12 | Disposition: A | Discharge status: Home / Self Care | Payer: Medicaid Other | Source: Ambulatory Visit | Attending: Neurology | Admitting: Neurology

## 2021-03-12 ENCOUNTER — Other Ambulatory Visit: Payer: Self-pay

## 2021-03-12 DIAGNOSIS — H539 Unspecified visual disturbance: Secondary | ICD-10-CM

## 2021-03-12 DIAGNOSIS — R519 Headache, unspecified: Secondary | ICD-10-CM

## 2021-03-12 MED ORDER — GADOBENATE DIMEGLUMINE 529 MG/ML IV SOLN
19.0000 mL | Freq: Once | INTRAVENOUS | Status: AC | PRN
  Administered 2021-03-12: 19 mL via INTRAVENOUS

## 2021-03-15 NOTE — Progress Notes (Signed)
Message left of the above

## 2021-03-28 ENCOUNTER — Other Ambulatory Visit: Payer: Self-pay | Admitting: Family Medicine

## 2021-03-29 ENCOUNTER — Other Ambulatory Visit: Payer: Self-pay

## 2021-03-31 NOTE — Telephone Encounter (Signed)
Patient returns call to nurse line to check status of rx refill.   Please advise.   Ethan Williams C Sendy Pluta, RN  

## 2021-04-13 ENCOUNTER — Other Ambulatory Visit: Payer: Self-pay

## 2021-04-13 ENCOUNTER — Ambulatory Visit (INDEPENDENT_AMBULATORY_CARE_PROVIDER_SITE_OTHER): Payer: Medicaid Other | Admitting: Family Medicine

## 2021-04-13 VITALS — BP 135/80 | HR 78 | Ht 72.0 in | Wt 203.4 lb

## 2021-04-13 DIAGNOSIS — Z559 Problems related to education and literacy, unspecified: Secondary | ICD-10-CM

## 2021-04-13 DIAGNOSIS — L989 Disorder of the skin and subcutaneous tissue, unspecified: Secondary | ICD-10-CM

## 2021-04-13 NOTE — Progress Notes (Signed)
    SUBJECTIVE:   CHIEF COMPLAINT / HPI:   Ethan Williams is a 60 yo M who presents for the below.   Skin lesions Spots on chest and back for many years. He does not feel as if they are changing but he would like a referral to a dermatologist for a full body skin check. He admits to spending more time outside in the sun. He has a history of tinea versicolor. No other associative symptoms.   PHQ9 w/ SI Patient denies SI/HI. Was not sure what form said so he just put something down. Admits that he has trouble reading.   PERTINENT  PMH / PSH: hx of lumbar strain  OBJECTIVE:   BP 135/80   Pulse 78   Ht 6' (1.829 m)   Wt 203 lb 6.4 oz (92.3 kg)   SpO2 98%   BMI 27.59 kg/m   [employer] Visit from 04/13/2021 in [facility]  PHQ-9 Total Score 20      General: Appears well, no acute distress. Age appropriate. Skin: Warm and dry, several telangiectasias, cherry hemangiomas, and erythematous macules pictured below. Media Information Document Information  Photos  Right front chest  04/13/2021 11:18  Attached To:  Office Visit on 04/13/21 with Autry-Lott, Randa Evens, DO   Source Information  Autry-Lott, [facility] Med Resident   Media Information Document Information  Photos    04/13/2021 11:20  Attached To:  Office Visit on 04/13/21 with Autry-Lott, Randa Evens, DO   Source Information  Autry-Lott, Randa Evens, [place]  [facility] Resident    ASSESSMENT/PLAN:   Skin lesions Non-specific skin lesions that are chronic and unchanging. Patient desires full body skin check. He spends an increased amount of time in the sun. Discussed with Dr. Leveda Anna. Scheduled with dermatology clinic.   Problem with literacy Patient has trouble reading and comprehending forms. Marked #9 on PHQ9 form as positive but patient denied stating his was not sure what the form was asking. Placed on problem list for future visits. PCP to consider need for CCM referral if deemed  necessary.   Ethan Jumbo, DO [facility]

## 2021-04-13 NOTE — Patient Instructions (Addendum)
Thank you for coming in today.  I have scheduled you for our dermatology clinic.  Please go see your chiropractor for adjustments if you would like.   Follow up with primary care doctor as needed.   Dr. Salvadore Dom

## 2021-04-17 DIAGNOSIS — Z559 Problems related to education and literacy, unspecified: Secondary | ICD-10-CM | POA: Insufficient documentation

## 2021-04-17 DIAGNOSIS — L989 Disorder of the skin and subcutaneous tissue, unspecified: Secondary | ICD-10-CM | POA: Insufficient documentation

## 2021-04-19 ENCOUNTER — Other Ambulatory Visit: Payer: Self-pay

## 2021-04-19 ENCOUNTER — Telehealth: Payer: Self-pay | Admitting: Neurology

## 2021-04-19 MED ORDER — UBRELVY 100 MG PO TABS: ORAL_TABLET | ORAL | 5 refills | Status: DC

## 2021-04-19 MED ORDER — TOPIRAMATE 50 MG PO TABS: ORAL_TABLET | ORAL | 0 refills | Status: DC

## 2021-04-19 NOTE — Telephone Encounter (Signed)
Patient has been on the topamax for 30 days and he did not have a migraine until he ran out medication and then had one. He took the Maxalt and it took 2 days for it to work.   He uses the [facility] on [address]   Per Patient Everlena Cooper wanted a update after 30 days on the medication

## 2021-04-19 NOTE — Telephone Encounter (Signed)
Pt called no answer voice mail left for pt to call the office back. Script for topamax sent in for pt as well as new script for ubrelvy.

## 2021-04-22 NOTE — Telephone Encounter (Signed)
Pt called and voice mail left per DRP that refill sent in for topamax and new script for ubrelvy sent in and needs a PA when we get the PA we will let them know script is ready for pick up

## 2021-04-28 ENCOUNTER — Ambulatory Visit (INDEPENDENT_AMBULATORY_CARE_PROVIDER_SITE_OTHER): Payer: Medicaid Other | Admitting: Family Medicine

## 2021-04-28 ENCOUNTER — Other Ambulatory Visit: Payer: Self-pay

## 2021-04-28 VITALS — BP 128/88 | HR 84 | Ht 72.0 in | Wt 203.0 lb

## 2021-04-28 DIAGNOSIS — B351 Tinea unguium: Secondary | ICD-10-CM

## 2021-04-28 DIAGNOSIS — N5089 Other specified disorders of the male genital organs: Secondary | ICD-10-CM | POA: Diagnosis not present

## 2021-04-28 DIAGNOSIS — L989 Disorder of the skin and subcutaneous tissue, unspecified: Secondary | ICD-10-CM | POA: Diagnosis not present

## 2021-04-28 DIAGNOSIS — L608 Other nail disorders: Secondary | ICD-10-CM | POA: Insufficient documentation

## 2021-04-28 DIAGNOSIS — B360 Pityriasis versicolor: Secondary | ICD-10-CM

## 2021-04-28 MED ORDER — TERBINAFINE HCL 1 % EX CREA: 1.0000 "application " | TOPICAL_CREAM | Freq: Two times a day (BID) | CUTANEOUS | 0 refills | Status: DC

## 2021-04-28 NOTE — Assessment & Plan Note (Signed)
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 Instructed patient to apply Vaseline twice daily to discourage patient from scratching or disturbing the area.

## 2021-04-28 NOTE — Progress Notes (Signed)
    SUBJECTIVE:   CHIEF COMPLAINT / HPI:   Patient presents to dermatology clinic regarding lesions that are on chest and back. They have been present for at least a year, patient states he was previously seen and told to use Selsun Blue shampoo on the areas as they thought it was ringworm, but the lesions did not improve and patient stopped using them. They do itch occasionally and the itching gets worse with sweating. Patient has a history of increased sun exposure over the last year and does not use sunscreen.  Patient also notes a lesion on his scalp that has been present since birth. There is no pain or bleeding from the lesion but it bothers him and he wonders if it can be removed. He does not think that it has gotten larger. Mostly noted by his barber when getting a haircut and when he is combing his hair.  Genital lesion noted that has been present for some time. He has had a prior lesion that was being followed by urology and had a biopsy at that time though he does not remember what the results were.   Patient notes dry/irritated and deformed nails. Initially denied picking at the nails but then admitted he may occasionally pick at them.   PERTINENT  PMH / PSH: Reviewed  OBJECTIVE:   BP 128/88   Pulse 84   Ht 6' (1.829 m)   Wt 203 lb (92.1 kg)   SpO2 98%   BMI 27.53 kg/m   General: NAD, well-appearing, well-nourished Respiratory: No respiratory distress, breathing comfortably, able to speak in full sentences Skin: warm and dry, erythematous macules on right side of trunk (anterior and posterior) as pictured below. Oncychomycoses of the bilateral great toes. Circular hyperpigmented raised area on the dorsal shaft.           Right back lesions   Right anterior chest     ASSESSMENT/PLAN:   Tinea versicolor Recurrent lesions consistent with possible tinea versicolor given exam and history of the lesions. Patient with minimal improvement with Selsun blue. Will use topical  terbinafine BID as needed. Can use Selsun blue if diffuse.   Onychomycosis Patient currently not wanting to take oral medications at this time as he is taking several other medications and does not want to confuse them. He will continue clipping his nails and will return if they become concerning.  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 Instructed patient to apply Vaseline twice daily to discourage patient from scratching or disturbing the area.   Genital lesion, male Patient previously seen by urology for lesions and had a biopsy completed. We do not have the records at this time. Lesion appears consistent with possible nevus on the dorsum of the shaft. Will await the results from the prior biopsy to determine management of this lesion. Patient instructed to monitor and follow-up with PCP.  Scalp lesion Large warty scalp lesion on the scalp with hairs growing. Appearance seems like a benign lesion, has been present since birth. Appears similar to an SK but rare that those would be present since birth. Will continue to monitor. Large base, decided against removal with shave biopsy at this time given increased bleeding of the area with possibility of recurrence. Patient to follow-up if he would like to have this removed.     Evelena Leyden, DO [facility]

## 2021-04-28 NOTE — Assessment & Plan Note (Signed)
Large warty scalp lesion on the scalp with hairs growing. Appearance seems like a benign lesion, has been present since birth. Appears similar to an SK but rare that those would be present since birth. Will continue to monitor. Large base, decided against removal with shave biopsy at this time given increased bleeding of the area with possibility of recurrence. Patient to follow-up if he would like to have this removed.

## 2021-04-28 NOTE — Assessment & Plan Note (Signed)
Patient previously seen by urology for lesions and had a biopsy completed. We do not have the records at this time. Lesion appears consistent with possible nevus on the dorsum of the shaft. Will await the results from the prior biopsy to determine management of this lesion. Patient instructed to monitor and follow-up with PCP.

## 2021-04-28 NOTE — Patient Instructions (Addendum)
For the lesions on your chest, you can apply the prescribed cream twice a day until they go away. If you have a lot of them all over your body you can use Selsun blue and see if that improves the spots.   The lesion on your head is likely nothing to worry about, if you would like to have it removed you can come back to the clinic and discuss further.  Put Vaseline or chapstick on your thumbs twice a day for your skin to help stop the dryness.  The lesion on your groin looks like it may be a mole or wart. We recommend just keeping an eye on it at this time and following up with your PCP.

## 2021-04-28 NOTE — Assessment & Plan Note (Signed)
Patient currently not wanting to take oral medications at this time as he is taking several other medications and does not want to confuse them. He will continue clipping his nails and will return if they become concerning.

## 2021-04-28 NOTE — Assessment & Plan Note (Signed)
Recurrent lesions consistent with possible tinea versicolor given exam and history of the lesions. Patient with minimal improvement with Selsun blue. Will use topical terbinafine BID as needed. Can use Selsun blue if diffuse.

## 2021-06-14 ENCOUNTER — Ambulatory Visit: Payer: Medicaid Other | Admitting: Neurology

## 2021-07-25 ENCOUNTER — Other Ambulatory Visit: Payer: Self-pay | Admitting: Neurology

## 2021-09-13 NOTE — Progress Notes (Signed)
NEUROLOGY FOLLOW UP OFFICE NOTE  Ethan Williams [id number]  Assessment/Plan:   Migraine with aura, without status migrainosus, not intractable - now with worsening migraines and has residual visual aura. Cervical spinal stenosis   1.Migraine prevention:  to help reduce frequent daily headache, increase topiramate to 100mg  at bedtime 3. Migraine rescue:  rizatriptan 10mg  4.Limit use of pain relievers to no more than 2 days out of week to prevent risk of rebound or medication-overuse headache. 5.  Keep headache diary 6.  Follow up 6 months.   Subjective:  Ethan Williams is a 61 year old right-handed Caucasian male who follows up for migraines.   UPDATE: Due to worsening migraines, MRI of brain with and without contrast was performed on 03/12/2021, which was personally reviewed and was unremarkable.  Started topiramate in June.  Migraines are significantly improved.. Intensity:  severe Duration:  1 hour Frequency:    once a month He always has a "headache hangover" sensation in his head. Over the past couple of weeks, it has been more severe.  Current NSAIDS:  Advil Current analgesics:  none Current triptans:  none Current ergotamine:  none Current anti-emetic:  none Current muscle relaxants:  none Current anti-anxiolytic:  none Current sleep aide:  none Current Antihypertensive medications:  none Current Antidepressant medications:  none Current Anticonvulsant medications: topiramate 50mg  at bedtime Current anti-CGRP:  Ubrelvy 100mg  Current Vitamins/Herbal/Supplements:  MVI Current Antihistamines/Decongestants:  none Other therapy:  none Hormone/birth control:  none   Caffeine:  16 oz coffee daily and sometimes 1 to cups in evening. Diet:  Does not drink much water.  A little bit of soda. Exercise:  Not routine Depression:  no; Anxiety:  no Other pain:  Bilateral shoulder pain, neck pain in morning Sleep hygiene:  okay   HISTORY:  He started having migraines  since 2013.  He starts with fatigue, slurred speech and visual aura with blurred vision, spots, crescent shapes, flashes and lines for 30 minutes followed by severe stabbing occipital headache (either side) radiating to the front of head lasting a 1 to 2 hours with sumatriptan, that lingers for a couple of days.  Associated with photophobia and phonophobia but not really nausea or vomiting.  Residual headache for 2 to 3 days.  Occurs every 2 weeks.  Coughing aggravates it.  Resting in dark and quiet room helps.     MRI and MRA of brain from 12/14/2011 were normal. MRI of cervical spine without contrast from 08/19/2018 showed moderately large C6-7 disc extrusion resulting in moderate spinal stenosis and moderate left neural foraminal stenosis, C5-6 disc degeneration and left parcentral protrusion resulting in moderate spinal stenosis and severe left neural foraminal stenosis, and moderate right neural foraminal stenosis at C3-4 and C4-5.  Underwent ACDF 5-6 and 6-7 in 2020.  Still has residual numbness in left arm and sometimes perioral numbness and numbness in the jaw bilateral.   Past NSAIDS/steroids:  naproxen Past analgesics:  Tylenol; Fiorciet; tramadol Past abortive triptans:  sumatriptan tab, rizatriptan Past abortive ergotamine:  none Past muscle relaxants:  baclofen Past anti-emetic:  none Past antihypertensive medications:  none Past antidepressant medications:  none Past anticonvulsant medications:  gabapentin Past anti-CGRP:  none Past vitamins/Herbal/Supplements:  none Other past therapies:  Coffee     Family history of headache:  no   History of concussion History of cervical spinal stenosis s/p fusion in February 2020.  PAST MEDICAL HISTORY: Past Medical History:  Diagnosis Date   Anginal pain (HCC)  Anxiety    Arthritis    "right shoulder" (06/06/2012), "finger joints"   Daily headache    "here lately they've been daily" (06/06/2012), no current problems per patient  06/16/19   Depression    Dysrhythmia    "palpitations" (06/06/2012)   External hemorrhoid, bleeding    GERD (gastroesophageal reflux disease)    Heart murmur    "as a child" (06/06/2012), no problems   Hyperlipidemia    Migraines    "treated for them in the last 6 months (06/06/2012), none since 2013   Pneumonia 2012; 06/06/2012   Seasonal allergies    "outdoor; pollen, grass" (06/06/2012)   Shortness of breath 06/06/2012   "at rest; lying down; w/exertion", no current problems per patient 10/12/200   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 (Patient not taking: Reported on 02/23/2021)     albuterol (VENTOLIN HFA) 108 (90 Base) MCG/ACT inhaler Inhale 2 puffs into the lungs every 4 (four) hours as needed for wheezing or shortness of breath. 18 g 3   calcium carbonate (TUMS - DOSED IN MG ELEMENTAL CALCIUM) 500 MG chewable tablet Chew 1 tablet by mouth daily as needed for indigestion or heartburn. (Patient not taking: Reported on 02/23/2021)     famotidine (PEPCID) 20 MG tablet TAKE 1 TABLET(20 MG) BY MOUTH TWICE DAILY 180 tablet 1   ibuprofen (ADVIL) 100 MG tablet Take 100 mg by mouth every 6 (six) hours as needed for fever.     Multiple Vitamin (MULTIVITAMIN WITH MINERALS) TABS tablet Take 1 tablet by mouth daily.     rizatriptan (MAXALT) 10 MG tablet Take 1 tablet earliest onset of migraine.  May repeat in 2 hours if needed.  Maximum 2 tablets in 24 hours. 10 tablet 5   rosuvastatin (CRESTOR) 10 MG tablet Take 1 tablet (10 mg total) by mouth daily. (Patient not taking: Reported on 02/23/2021) 90 tablet 3   terbinafine (LAMISIL AT) 1 % cream Apply 1 application topically 2 (two) times daily. 30 g 0   topiramate (TOPAMAX) 50 MG tablet TAKE 1  TABLET BY MOUTH AT BEDTIME 90 tablet 0   Ubrogepant (UBRELVY) 100 MG TABS take 1 tablet as needed.  May repeat in 2 hours.  Maximum 2 tablets in 24 hours 16 tablet 5   No current facility-administered medications on file prior to visit.    ALLERGIES: No Known Allergies  FAMILY HISTORY: Family History  Problem Relation Age of Onset   Cancer Father    Colon cancer Neg Hx    Rectal cancer Neg Hx    Prostate cancer Neg Hx       Objective:  Blood pressure (!) 143/87, pulse 83, height 5\' 11"  (1.803 m), weight 203 lb 6.4 oz (92.3 kg), SpO2 97 %. General: No acute distress.  Patient appears well-groomed.    , DO

## 2021-09-14 ENCOUNTER — Other Ambulatory Visit: Payer: Self-pay

## 2021-09-14 ENCOUNTER — Other Ambulatory Visit: Payer: Self-pay | Admitting: Neurology

## 2021-09-14 ENCOUNTER — Ambulatory Visit: Payer: Medicaid Other | Admitting: Neurology

## 2021-09-14 ENCOUNTER — Encounter: Payer: Self-pay | Admitting: Neurology

## 2021-09-14 VITALS — BP 143/87 | HR 83 | Ht 71.0 in | Wt 203.4 lb

## 2021-09-14 DIAGNOSIS — G43101 Migraine with aura, not intractable, with status migrainosus: Secondary | ICD-10-CM

## 2021-09-14 DIAGNOSIS — M4802 Spinal stenosis, cervical region: Secondary | ICD-10-CM | POA: Diagnosis not present

## 2021-09-14 MED ORDER — TOPIRAMATE 100 MG PO TABS: 100.0000 mg | ORAL_TABLET | Freq: Two times a day (BID) | ORAL | 3 refills | Status: DC

## 2021-09-14 MED ORDER — TOPIRAMATE 100 MG PO TABS: 100.0000 mg | ORAL_TABLET | Freq: Every day | ORAL | 5 refills | Status: DC

## 2021-09-14 NOTE — Patient Instructions (Signed)
Increase topiramate to 100mg  at bedtime Rizatriptan as needed/directed. Limit use of pain relievers to no more than 2 days out of week to prevent risk of rebound or medication-overuse headache. Follow up in 6 months.

## 2021-10-03 ENCOUNTER — Other Ambulatory Visit: Payer: Self-pay | Admitting: Student

## 2021-10-27 ENCOUNTER — Other Ambulatory Visit: Payer: Self-pay | Admitting: Neurology

## 2022-03-03 ENCOUNTER — Other Ambulatory Visit: Payer: Self-pay | Admitting: Orthopedic Surgery

## 2022-03-08 ENCOUNTER — Other Ambulatory Visit: Payer: Self-pay | Admitting: Orthopedic Surgery

## 2022-03-08 DIAGNOSIS — G8929 Other chronic pain: Secondary | ICD-10-CM

## 2022-03-15 ENCOUNTER — Ambulatory Visit
Admission: RE | Admit: 2022-03-15 | Discharge: 2022-03-15 | Disposition: A | Discharge status: Home / Self Care | Payer: Medicaid Other | Source: Ambulatory Visit | Attending: Orthopedic Surgery | Admitting: Orthopedic Surgery

## 2022-03-15 DIAGNOSIS — G8929 Other chronic pain: Secondary | ICD-10-CM

## 2022-03-15 MED ORDER — METHYLPREDNISOLONE ACETATE 40 MG/ML INJ SUSP (RADIOLOG
80.0000 mg | Freq: Once | INTRAMUSCULAR | Status: AC
  Administered 2022-03-15: 80 mg via EPIDURAL

## 2022-03-15 MED ORDER — IOPAMIDOL (ISOVUE-M 200) INJECTION 41%
1.0000 mL | Freq: Once | INTRAMUSCULAR | Status: AC
  Administered 2022-03-15: 1 mL via EPIDURAL

## 2022-03-15 NOTE — Discharge Instructions (Signed)

## 2022-03-17 ENCOUNTER — Other Ambulatory Visit: Payer: Self-pay | Admitting: Family Medicine

## 2022-03-24 NOTE — Progress Notes (Unsigned)
NEUROLOGY FOLLOW UP OFFICE NOTE  Ethan Williams [id number]  Assessment/Plan:   1  Migraine with aura, without status migrainosus, not intractable - now with worsening migraines and has residual visual aura. 2  Cervical spinal stenosis 3. Left-sided occipital neuralgia   1.Migraine prevention:  Topiramate 100mg  at bedtime 3. Migraine rescue:  rizatriptan 10mg  4.Limit use of pain relievers to no more than 2 days out of week to prevent risk of rebound or medication-overuse headache. 5.  Keep headache diary 6.  Caffeine and cigarette cessation. 7.  Follow up 1 year   Subjective:  Ethan Williams is a 61 year old right-handed Caucasian male who follows up for migraines.   UPDATE: Increased topiramate to 100mg  at bedtime. Intensity:  severe Duration:  1 hour with rizatriptan Frequency:    once a month    Current NSAIDS:  Advil Current analgesics:  none Current triptans:  rizatriptan 10mg  Current ergotamine:  none Current anti-emetic:  none Current muscle relaxants:  none Current anti-anxiolytic:  none Current sleep aide:  none Current Antihypertensive medications:  none Current Antidepressant medications:  none Current Anticonvulsant medications: topiramate 100mg  at bedtime Current anti-CGRP:  Ubrelvy 100mg  Current Vitamins/Herbal/Supplements:  MVI Current Antihistamines/Decongestants:  none Other therapy:  none Hormone/birth control:  none   Caffeine:  Stopped coffee and smoking intake, which has helped.  Headache and neck pain improved.  Restarted and headaches/neck pain worse.   Diet:  Does not drink much water.  A little bit of soda. Exercise:  Not routine Depression:  no; Anxiety:  no Other pain: Has been dealing with low back pain radiating into left groin.  Saw his orthopedist.  Received cortisone injection which has helped. Sleep hygiene:  okay   HISTORY:  He started having migraines since 2013.  He starts with fatigue, slurred speech and visual aura with  blurred vision, spots, crescent shapes, flashes and lines for 30 minutes followed by severe stabbing occipital headache (either side but usually left-sided) radiating to the front of head lasting a 1 to 2 hours with sumatriptan, that lingers for a couple of days.  Associated with photophobia and phonophobia but not really nausea or vomiting.  Residual headache for 2 to 3 days.  Occurs every 2 weeks.  Coughing aggravates it.  Resting in dark and quiet room helps.     MRI and MRA of brain from 12/14/2011 were normal. MRI of cervical spine without contrast from 08/19/2018 showed moderately large C6-7 disc extrusion resulting in moderate spinal stenosis and moderate left neural foraminal stenosis, C5-6 disc degeneration and left parcentral protrusion resulting in moderate spinal stenosis and severe left neural foraminal stenosis, and moderate right neural foraminal stenosis at C3-4 and C4-5.  Underwent ACDF 5-6 and 6-7 in 2020.  Still has residual numbness in left arm and sometimes perioral numbness and numbness in the jaw bilateral.  Due to worsening migraines, MRI of brain with and without contrast was performed on 03/12/2021, which was unremarkable.   Past NSAIDS/steroids:  naproxen Past analgesics:  Tylenol; Fiorciet; tramadol Past abortive triptans:  sumatriptan tab, rizatriptan Past abortive ergotamine:  none Past muscle relaxants:  baclofen Past anti-emetic:  none Past antihypertensive medications:  none Past antidepressant medications:  none Past anticonvulsant medications:  gabapentin Past anti-CGRP:  none Past vitamins/Herbal/Supplements:  none Other past therapies:  Coffee     Family history of headache:  no   History of concussion History of cervical spinal stenosis s/p fusion in February 2020.  PAST MEDICAL HISTORY: Past Medical History:  Diagnosis Date   Anginal pain (HCC)    Anxiety    Arthritis    "right shoulder" (06/06/2012), "finger joints"   Daily headache    "here  lately they've been daily" (06/06/2012), no current problems per patient 06/16/19   Depression    Dysrhythmia    "palpitations" (06/06/2012)   External hemorrhoid, bleeding    GERD (gastroesophageal reflux disease)    Heart murmur    "as a child" (06/06/2012), no problems   Hyperlipidemia    Migraines    "treated for them in the last 6 months (06/06/2012), none since 2013   Pneumonia 2012; 06/06/2012   Seasonal allergies    "outdoor; pollen, grass" (06/06/2012)   Shortness of breath 06/06/2012   "at rest; lying down; w/exertion", no current problems per patient 10/12/200   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 (Patient not taking: Reported on 02/23/2021)     albuterol (VENTOLIN HFA) 108 (90 Base) MCG/ACT inhaler Inhale 2 puffs into the lungs every 4 (four) hours as needed for wheezing or shortness of breath. 18 g 3   calcium carbonate (TUMS - DOSED IN MG ELEMENTAL CALCIUM) 500 MG chewable tablet Chew 1 tablet by mouth daily as needed for indigestion or heartburn.     famotidine (PEPCID) 20 MG tablet TAKE 1 TABLET(20 MG) BY MOUTH TWICE DAILY 180 tablet 1   ibuprofen (ADVIL) 100 MG tablet Take 100 mg by mouth every 6 (six) hours as needed for fever.     rizatriptan (MAXALT) 10 MG tablet Take 1 tablet earliest onset of migraine.  May repeat in 2 hours if needed.  Maximum 2 tablets in 24 hours. 10 tablet 5   rosuvastatin (CRESTOR) 10 MG tablet Take 1 tablet (10 mg total) by mouth daily. 90 tablet 3   terbinafine (LAMISIL AT) 1 % cream Apply 1 application topically 2 (two) times daily. 30 g 0   topiramate (TOPAMAX) 100 MG tablet Take 1 tablet (100 mg total) by mouth at bedtime. 30 tablet 5   Ubrogepant (UBRELVY) 100 MG TABS take 1 tablet as  needed.  May repeat in 2 hours.  Maximum 2 tablets in 24 hours 16 tablet 5   No current facility-administered medications on file prior to visit.    ALLERGIES: No Known Allergies  FAMILY HISTORY: Family History  Problem Relation Age of Onset   Cancer Father    Colon cancer Neg Hx    Rectal cancer Neg Hx    Prostate cancer Neg Hx       Objective:  Blood pressure 119/83, pulse 97, height 5\' 11"  (1.803 m), weight 193 lb (87.5 kg), SpO2 94 %. General: No acute distress.  Patient appears well-groomed.   Head:  Normocephalic/atraumatic Eyes:  Fundi examined but not visualized Neck: supple, no paraspinal tenderness, full range of motion Heart:  Regular rate and rhythm Neurological Exam: alert and oriented to person, place, and time.  Speech fluent and not dysarthric, language intact.  CN II-XII intact. Bulk and tone normal, muscle strength 5/5 throughout.  Sensation to light touch intact.  Deep tendon reflexes 2+ throughout.  Finger to nose testing intact.  Gait normal, Romberg negative.   , DO  CC: Shon Millet, DO

## 2022-03-27 ENCOUNTER — Ambulatory Visit: Payer: Medicaid Other | Admitting: Neurology

## 2022-03-27 ENCOUNTER — Encounter: Payer: Self-pay | Admitting: Neurology

## 2022-03-27 VITALS — BP 119/83 | HR 97 | Ht 71.0 in | Wt 193.0 lb

## 2022-03-27 DIAGNOSIS — G43101 Migraine with aura, not intractable, with status migrainosus: Secondary | ICD-10-CM

## 2022-03-27 DIAGNOSIS — M5481 Occipital neuralgia: Secondary | ICD-10-CM

## 2022-03-27 DIAGNOSIS — M4802 Spinal stenosis, cervical region: Secondary | ICD-10-CM | POA: Diagnosis not present

## 2022-03-27 MED ORDER — TOPIRAMATE 100 MG PO TABS: 100.0000 mg | ORAL_TABLET | Freq: Every day | ORAL | 5 refills | Status: DC

## 2022-03-27 MED ORDER — RIZATRIPTAN BENZOATE 10 MG PO TABS: ORAL_TABLET | ORAL | 5 refills | Status: DC

## 2022-03-27 NOTE — Patient Instructions (Signed)
Continue topiramate 100mg  at bedtime Limit use of pain relievers to no more than 2 days out of week to prevent risk of rebound or medication-overuse headache. Quit caffeine and tobacco Follow up one year.

## 2022-03-28 ENCOUNTER — Encounter: Payer: Self-pay | Admitting: Neurology

## 2022-05-16 ENCOUNTER — Ambulatory Visit (INDEPENDENT_AMBULATORY_CARE_PROVIDER_SITE_OTHER): Payer: Medicaid Other | Admitting: Family Medicine

## 2022-05-16 ENCOUNTER — Encounter: Payer: Self-pay | Admitting: Family Medicine

## 2022-05-16 VITALS — BP 140/94 | HR 84 | Ht 71.0 in | Wt 194.8 lb

## 2022-05-16 DIAGNOSIS — R21 Rash and other nonspecific skin eruption: Secondary | ICD-10-CM

## 2022-05-16 MED ORDER — TRIAMCINOLONE ACETONIDE 0.1 % EX OINT: 1.0000 | TOPICAL_OINTMENT | Freq: Two times a day (BID) | CUTANEOUS | 0 refills | Status: DC

## 2022-05-16 NOTE — Progress Notes (Signed)
    SUBJECTIVE:   CHIEF COMPLAINT / HPI:   Rash - rash spread after getting sweaty and now itching after doing lawn work - Not currently using anything  - No OTC medications have previously worked - Patient frustrated with course of care and would like the rash gone  PERTINENT  PMH / PSH: Reviewed  OBJECTIVE:   BP (!) 140/94   Pulse 84   Ht 5\' 11"  (1.803 m)   Wt 194 lb 12.8 oz (88.4 kg)   SpO2 97%   BMI 27.17 kg/m   General: NAD, well-appearing, well-nourished Respiratory: No respiratory distress, breathing comfortably, able to speak in full sentences Skin: warm and dry, papular rash present on trunk and back s Psych: Appropriate affect and mood  ASSESSMENT/PLAN:   Skin rash Patient with rash for multiple years, has previously been treated with antifungals due to concern for either ringworm versus tinea versicolor.  Patient has not had much improvement with any of the treatments he has had in the past.  Given new episode of irritation with itching, consideration for some type of dermatitis or a nummular eczema picture.  Patient prefers to trial treatment and then follow-up with biopsy if no improvement or if the rash is still present. - Kenalog 0.1% ointment twice daily x14 days - Follow-up in 2 weeks to see if improvement in symptoms - If necessary, plan for biopsy at 4-week interval - Discussed with patient that steroid therapy must stop at least 10 days prior to biopsy - Referral for dermatology placed in the meantime - Discussed with patient that we may not be able to completely cure his rash if it has been present for many years   , DO [facility]

## 2022-05-16 NOTE — Patient Instructions (Addendum)
-   I am going to start you on a topical steroid cream that I want you to use for the next 2 weeks.  If you do not notice any improvement after 2 weeks, I want you to stop the medication and call our office to get an appointment scheduled for a biopsy.  It will be important to make sure you are not on steroids for at least 10 days prior to getting this biopsy done.  Please call the office to let me know so I can schedule you  - I will go ahead and send in a dermatology referral so that it is in the works in case we do not figure out what is going on.

## 2022-05-23 ENCOUNTER — Other Ambulatory Visit: Payer: Self-pay | Admitting: Orthopedic Surgery

## 2022-05-23 DIAGNOSIS — M545 Low back pain, unspecified: Secondary | ICD-10-CM

## 2022-05-31 ENCOUNTER — Ambulatory Visit
Admission: RE | Admit: 2022-05-31 | Discharge: 2022-05-31 | Disposition: A | Discharge status: Home / Self Care | Payer: Medicaid Other | Source: Ambulatory Visit | Attending: Orthopedic Surgery | Admitting: Orthopedic Surgery

## 2022-05-31 ENCOUNTER — Other Ambulatory Visit: Payer: Self-pay | Admitting: Orthopedic Surgery

## 2022-05-31 DIAGNOSIS — M545 Low back pain, unspecified: Secondary | ICD-10-CM

## 2022-05-31 MED ORDER — METHYLPREDNISOLONE ACETATE 40 MG/ML INJ SUSP (RADIOLOG
80.0000 mg | Freq: Once | INTRAMUSCULAR | Status: AC
  Administered 2022-05-31: 80 mg via EPIDURAL

## 2022-05-31 MED ORDER — IOPAMIDOL (ISOVUE-M 200) INJECTION 41%
1.0000 mL | Freq: Once | INTRAMUSCULAR | Status: AC
  Administered 2022-05-31: 1 mL via EPIDURAL

## 2022-05-31 NOTE — Discharge Instructions (Signed)

## 2022-06-06 ENCOUNTER — Encounter: Payer: Self-pay | Admitting: Family Medicine

## 2022-06-06 ENCOUNTER — Ambulatory Visit (INDEPENDENT_AMBULATORY_CARE_PROVIDER_SITE_OTHER): Payer: Medicaid Other | Admitting: Family Medicine

## 2022-06-06 VITALS — BP 146/104 | HR 76 | Ht 71.0 in | Wt 199.2 lb

## 2022-06-06 DIAGNOSIS — Z23 Encounter for immunization: Secondary | ICD-10-CM

## 2022-06-06 DIAGNOSIS — L989 Disorder of the skin and subcutaneous tissue, unspecified: Secondary | ICD-10-CM

## 2022-06-06 DIAGNOSIS — Z1211 Encounter for screening for malignant neoplasm of colon: Secondary | ICD-10-CM

## 2022-06-06 DIAGNOSIS — E785 Hyperlipidemia, unspecified: Secondary | ICD-10-CM

## 2022-06-06 DIAGNOSIS — Z0001 Encounter for general adult medical examination with abnormal findings: Secondary | ICD-10-CM | POA: Diagnosis not present

## 2022-06-06 NOTE — Patient Instructions (Signed)
Everything looks good today.  Below are the things we discussed.  - Smoking: If you decide that you are at the point of wanting to quit smoking and needs some help, you can always come back to our office and talk with Dr. Valentina Lucks who is our pharmacist in the clinic.  - Rash: Finish off the bottle of steroid cream that you have, if at the end you feel like you are not satisfied with the results, just call our office and we can plan to do that biopsy.  - Let me know when you are ready to do labs and we can check your cholesterol with your next visit

## 2022-06-06 NOTE — Assessment & Plan Note (Signed)
Reports intolerance to rosuvastatin and discontinued quite some time ago. Declines lipid panel today. Discussed the importance of heart health and risk factors for cardiac disease.

## 2022-06-06 NOTE — Progress Notes (Signed)
    SUBJECTIVE:   Chief compliant/HPI: annual examination  Ethan Williams is a 61 y.o. who presents today for an annual exam.   Review of systems form notable for some improvement of his rash. Patient reports that since he has been taking the steroid cream some of the rash areas have disappeared or improved, he has not been using it twice daily every single day.   - Currently smoking <1PPD, is precontemplative about quitting but doesn't want to pursue intervention at this time.   - HLD: previously elevated, did not tolerate rosuvastatin due to muscle aching. Declines lipid panel today  OBJECTIVE:   BP (!) 146/104   Pulse 76   Ht 5\' 11"  (1.803 m)   Wt 199 lb 3.2 oz (90.4 kg)   SpO2 99%   BMI 27.78 kg/m   Gen: well-appearing, NAD CV: RRR, no m/r/g appreciated, no peripheral edema Pulm: CTAB, no wheezes/crackles GI: soft, non-tender, non-distended  ASSESSMENT/PLAN:   Skin lesions Patient reports some improvement, is still wanting complete resolution but discussed this may not be possible. Patient prefers to continue with steroid cream treatment and if that is not resolved by the time he finishes his current prescription then he will call and plan for biopsy if it continues to bother him.   Hyperlipidemia Reports intolerance to rosuvastatin and discontinued quite some time ago. Declines lipid panel today. Discussed the importance of heart health and risk factors for cardiac disease.     Annual Examination  See AVS for age appropriate recommendations.   Blood pressure reviewed and not at goal, patient will continue to monitor with next visit.  Asked about intimate partner violence and patient reports none.  Considered the following items based upon USPSTF recommendations: HIV testing: declined Hepatitis C: declined Hepatitis B: declined Syphilis if at high risk:  declined GC/CT not at high risk and not ordered. Lipid panel (nonfasting or fasting) discussed based upon AHA  recommendations and not ordered as patient declined.  Consider repeat every 4-6 years.  Reviewed risk factors for latent tuberculosis and not indicated  Immunizations Flu vaccine today   Follow up in 2-4 weeks for rash and BP follow-up    Rise Patience, DO [place]

## 2022-10-04 ENCOUNTER — Other Ambulatory Visit: Payer: Self-pay | Admitting: Family Medicine

## 2022-12-16 ENCOUNTER — Emergency Department (HOSPITAL_COMMUNITY)
Admission: EM | Admit: 2022-12-16 | Discharge: 2022-12-17 | Disposition: A | Discharge status: Home / Self Care | Payer: Medicaid Other | Attending: Emergency Medicine | Admitting: Emergency Medicine

## 2022-12-16 ENCOUNTER — Encounter (HOSPITAL_COMMUNITY): Payer: Self-pay | Admitting: Emergency Medicine

## 2022-12-16 DIAGNOSIS — K0889 Other specified disorders of teeth and supporting structures: Secondary | ICD-10-CM | POA: Diagnosis present

## 2022-12-16 NOTE — ED Triage Notes (Signed)
Pt has swelling to lower back tooth. Has dental cleaning scheduled for first of month.

## 2022-12-17 MED ORDER — PENICILLIN V POTASSIUM 500 MG PO TABS: 500.0000 mg | ORAL_TABLET | Freq: Four times a day (QID) | ORAL | 0 refills | Status: DC

## 2022-12-17 MED ORDER — ACETAMINOPHEN 500 MG PO TABS
1000.0000 mg | ORAL_TABLET | Freq: Once | ORAL | Status: AC
  Administered 2022-12-17: 1000 mg via ORAL
  Filled 2022-12-17: qty 2

## 2022-12-17 MED ORDER — PENICILLIN V POTASSIUM 250 MG PO TABS
500.0000 mg | ORAL_TABLET | Freq: Once | ORAL | Status: AC
  Administered 2022-12-17: 500 mg via ORAL
  Filled 2022-12-17: qty 2

## 2022-12-17 MED ORDER — ACETAMINOPHEN 500 MG PO TABS: 500.0000 mg | ORAL_TABLET | Freq: Four times a day (QID) | ORAL | 0 refills | Status: AC | PRN

## 2022-12-17 NOTE — ED Provider Notes (Signed)
[facility]ENC[facility]PT [facility] Emergency Department Provider Note MRN:  [id number]  Arrival date & time: 12/17/22     Chief Complaint   Dental Pain   History of Present Illness   Ethan Williams is a 62 y.o. year-old male presents to the ED with chief complaint of dental pain.  He states that it is his right lower bicuspid.  He states that he noticed the symptoms worsening today.  Denies fevers or chills.  States he has follow-up with a dentist in a couple weeks.  Request dose of antibiotics and a dose of Tylenol.  Denies any other complaints.  History provided by patient.   Review of Systems  Pertinent positive and negative review of systems noted in HPI.    Physical Exam   Vitals:   12/16/22 2320 12/16/22 2340  BP: 137/88   Pulse: 85   Resp: 16   Temp:  98.8 F (37.1 C)  SpO2: 95%     CONSTITUTIONAL:  well-appearing, NAD NEURO:  Alert and oriented x 3, CN 3-12 grossly intact EYES:  eyes equal and reactive ENT/NECK:  Supple, no stridor, poor dentition, cracked tooth #30 CARDIO:  normal rate, appears well-perfused  PULM:  No respiratory distress,  GI/GU:  non-distended,  MSK/SPINE:  No gross deformities, no edema, moves all extremities  SKIN:  no rash, atraumatic   *Additional and/or pertinent findings included in MDM below  Diagnostic and Interventional Summary    EKG Interpretation  Date/Time:    Ventricular Rate:    PR Interval:    QRS Duration:   QT Interval:    QTC Calculation:   R Axis:     Text Interpretation:         Labs Reviewed - No data to display  No orders to display    Medications  penicillin v potassium (VEETID) tablet 500 mg (has no administration in time range)  acetaminophen (TYLENOL) tablet 1,000 mg (has no administration in time range)     Procedures  /  Critical Care Procedures  ED Course and Medical Decision Making  I have reviewed the triage vital signs, the nursing notes, and pertinent available records from the  EMR.  Social Determinants Affecting Complexity of Care: Patient .   ED Course:    Medical Decision Making Patient with dentalgia.  No abscess requiring immediate incision and drainage.  Exam not concerning for Ludwig's angina or pharyngeal abscess.  Will treat with penicillin. Pt instructed to follow-up with dentist.  Discussed return precautions. Pt safe for discharge.   Risk OTC drugs. Prescription drug management.     Consultants: No consultations were needed in caring for this patient.   Treatment and Plan: Emergency department workup does not suggest an emergent condition requiring admission or immediate intervention beyond  what has been performed at this time. The patient is safe for discharge and has  been instructed to return immediately for worsening symptoms, change in  symptoms or any other concerns    Final Clinical Impressions(s) / ED Diagnoses     ICD-10-CM   1. Pain, dental  K08.89       ED Discharge Orders          Ordered    penicillin v potassium (VEETID) 500 MG tablet  4 times daily        12/17/22 0029    acetaminophen (TYLENOL) 500 MG tablet  Every 6 hours PRN        12/17/22 0029  Discharge Instructions Discussed with and Provided to Patient:   Discharge Instructions   None      Roxy Horseman, PA-C 12/17/22 0031    Maia Plan, MD 12/17/22 [phone]

## 2023-01-10 ENCOUNTER — Encounter (HOSPITAL_COMMUNITY): Payer: Self-pay

## 2023-01-10 ENCOUNTER — Emergency Department (HOSPITAL_COMMUNITY)
Admission: EM | Admit: 2023-01-10 | Discharge: 2023-01-10 | Disposition: A | Discharge status: Home / Self Care | Payer: Medicaid Other | Attending: Emergency Medicine | Admitting: Emergency Medicine

## 2023-01-10 ENCOUNTER — Other Ambulatory Visit: Payer: Self-pay

## 2023-01-10 ENCOUNTER — Emergency Department (HOSPITAL_COMMUNITY): Payer: Medicaid Other

## 2023-01-10 DIAGNOSIS — K5732 Diverticulitis of large intestine without perforation or abscess without bleeding: Secondary | ICD-10-CM | POA: Diagnosis not present

## 2023-01-10 DIAGNOSIS — D72829 Elevated white blood cell count, unspecified: Secondary | ICD-10-CM | POA: Insufficient documentation

## 2023-01-10 DIAGNOSIS — N201 Calculus of ureter: Secondary | ICD-10-CM | POA: Insufficient documentation

## 2023-01-10 DIAGNOSIS — K5792 Diverticulitis of intestine, part unspecified, without perforation or abscess without bleeding: Secondary | ICD-10-CM

## 2023-01-10 DIAGNOSIS — R1033 Periumbilical pain: Secondary | ICD-10-CM | POA: Diagnosis present

## 2023-01-10 DIAGNOSIS — F17200 Nicotine dependence, unspecified, uncomplicated: Secondary | ICD-10-CM | POA: Insufficient documentation

## 2023-01-10 LAB — CBC WITH DIFFERENTIAL/PLATELET
Abs Immature Granulocytes: 0.06 10*3/uL (ref 0.00–0.07)
Basophils Absolute: 0.1 10*3/uL (ref 0.0–0.1)
Basophils Relative: 1 %
Eosinophils Absolute: 0.2 10*3/uL (ref 0.0–0.5)
Eosinophils Relative: 1 %
HCT: 47 % (ref 39.0–52.0)
Hemoglobin: 15.4 g/dL (ref 13.0–17.0)
Immature Granulocytes: 0 %
Lymphocytes Relative: 16 %
Lymphs Abs: 2.5 10*3/uL (ref 0.7–4.0)
MCH: 28.8 pg (ref 26.0–34.0)
MCHC: 32.8 g/dL (ref 30.0–36.0)
MCV: 87.9 fL (ref 80.0–100.0)
Monocytes Absolute: 1.6 10*3/uL — ABNORMAL HIGH (ref 0.1–1.0)
Monocytes Relative: 10 %
Neutro Abs: 11.1 10*3/uL — ABNORMAL HIGH (ref 1.7–7.7)
Neutrophils Relative %: 72 %
Platelets: 237 10*3/uL (ref 150–400)
RBC: 5.35 MIL/uL (ref 4.22–5.81)
RDW: 14 % (ref 11.5–15.5)
WBC: 15.5 10*3/uL — ABNORMAL HIGH (ref 4.0–10.5)
nRBC: 0 % (ref 0.0–0.2)

## 2023-01-10 LAB — COMPREHENSIVE METABOLIC PANEL
ALT: 22 U/L (ref 0–44)
AST: 15 U/L (ref 15–41)
Albumin: 4 g/dL (ref 3.5–5.0)
Alkaline Phosphatase: 49 U/L (ref 38–126)
Anion gap: 8 (ref 5–15)
BUN: 10 mg/dL (ref 8–23)
CO2: 26 mmol/L (ref 22–32)
Calcium: 9.2 mg/dL (ref 8.9–10.3)
Chloride: 100 mmol/L (ref 98–111)
Creatinine, Ser: 1.03 mg/dL (ref 0.61–1.24)
GFR, Estimated: >60 mL/min (ref >60)
Glucose, Bld: 109 mg/dL — ABNORMAL HIGH (ref 70–99)
Potassium: 3.9 mmol/L (ref 3.5–5.1)
Sodium: 134 mmol/L — ABNORMAL LOW (ref 135–145)
Total Bilirubin: 0.7 mg/dL (ref 0.3–1.2)
Total Protein: 6.8 g/dL (ref 6.5–8.1)

## 2023-01-10 LAB — URINALYSIS, ROUTINE W REFLEX MICROSCOPIC
Bacteria, UA: NONE SEEN
Bilirubin Urine: NEGATIVE
Glucose, UA: NEGATIVE mg/dL
Hgb urine dipstick: NEGATIVE
Ketones, ur: NEGATIVE mg/dL
Leukocytes,Ua: NEGATIVE
Nitrite: POSITIVE — AB
Protein, ur: NEGATIVE mg/dL
Specific Gravity, Urine: 1.017 (ref 1.005–1.030)
pH: 6 (ref 5.0–8.0)

## 2023-01-10 LAB — LIPASE, BLOOD: Lipase: 33 U/L (ref 11–51)

## 2023-01-10 MED ORDER — SODIUM CHLORIDE (PF) 0.9 % IJ SOLN
INTRAMUSCULAR | Status: AC
  Filled 2023-01-10: qty 50

## 2023-01-10 MED ORDER — ACETAMINOPHEN 325 MG PO TABS
650.0000 mg | ORAL_TABLET | Freq: Once | ORAL | Status: AC
  Administered 2023-01-10: 650 mg via ORAL
  Filled 2023-01-10: qty 2

## 2023-01-10 MED ORDER — SODIUM CHLORIDE 0.9 % IV BOLUS
1000.0000 mL | Freq: Once | INTRAVENOUS | Status: AC
  Administered 2023-01-10: 1000 mL via INTRAVENOUS

## 2023-01-10 MED ORDER — KETOROLAC TROMETHAMINE 15 MG/ML IJ SOLN
15.0000 mg | Freq: Once | INTRAMUSCULAR | Status: AC
  Administered 2023-01-10: 15 mg via INTRAVENOUS
  Filled 2023-01-10: qty 1

## 2023-01-10 MED ORDER — IOHEXOL 300 MG/ML  SOLN
100.0000 mL | Freq: Once | INTRAMUSCULAR | Status: AC | PRN
  Administered 2023-01-10: 100 mL via INTRAVENOUS

## 2023-01-10 MED ORDER — TAMSULOSIN HCL 0.4 MG PO CAPS: 0.4000 mg | ORAL_CAPSULE | Freq: Every day | ORAL | 0 refills | Status: AC

## 2023-01-10 MED ORDER — AMOXICILLIN-POT CLAVULANATE 875-125 MG PO TABS: 1.0000 | ORAL_TABLET | Freq: Three times a day (TID) | ORAL | 0 refills | Status: AC

## 2023-01-10 MED ORDER — HYDROCODONE-ACETAMINOPHEN 5-325 MG PO TABS: 1.0000 | ORAL_TABLET | Freq: Four times a day (QID) | ORAL | 0 refills | Status: DC | PRN

## 2023-01-10 NOTE — Discharge Instructions (Addendum)
It was a pleasure taking care of you today.  As discussed, your CT scan showed you had diverticulitis and a kidney stone.  I am sending you home with antibiotics.  Take as prescribed and finish all antibiotics.  I am also sending you home with pain medication.  Take as needed for severe pain. You may also take Flomax daily to help pass the stone. Follow-up with urology this afternoon at your scheduled appointment. Return to the ER for new or worsening symptoms.

## 2023-01-10 NOTE — ED Provider Notes (Signed)
[place] EMERGENCY DEPARTMENT AT [facility] Provider Note   CSN: [id number] Arrival date & time: 01/10/23  0443     History  Chief Complaint  Patient presents with   Abdominal Pain    Ethan Williams is a 62 y.o. male with a past medical history significant for BPH, tobacco abuse, hyperlipidemia, migraines, and GERD who presents to the ED due to lower abdominal pain and dysuria that started yesterday.  Denies fever.  Denies penile discharge.  Patient states pain is below his umbilicus and occasionally radiates to scrotum..  Admits to a history of UTIs, last UTI was a few years ago.  Patient has an appointment with urology at 3 PM today however notes he was unable to wait for his appointment due to significant pain.  Denies nausea, vomiting, diarrhea.  Denies back pain.  Denies concerns for STIs however, was sexually active roughly 5 days ago with the same partner.  No previous abdominal operations.  History obtained from patient and past medical records. No interpreter used during encounter.       Home Medications Prior to Admission medications   Medication Sig Start Date End Date Taking? Authorizing Provider  amoxicillin-clavulanate (AUGMENTIN) 875-125 MG tablet Take 1 tablet by mouth 3 (three) times daily for 5 days. 01/10/23 01/15/23 Yes Lachlyn Vanderstelt, Merla Riches, PA-C  HYDROcodone-acetaminophen (NORCO/VICODIN) 5-325 MG tablet Take 1 tablet by mouth every 6 (six) hours as needed for up to 6 doses. 01/10/23  Yes Quanika Solem, Merla Riches, PA-C  tamsulosin (FLOMAX) 0.4 MG CAPS capsule Take 1 capsule (0.4 mg total) by mouth daily. 01/10/23  Yes Elma Shands, Merla Riches, PA-C  acetaminophen (TYLENOL) 500 MG tablet Take 1 tablet (500 mg total) by mouth every 6 (six) hours as needed. 12/17/22   Roxy Horseman, PA-C  albuterol (VENTOLIN HFA) 108 (90 Base) MCG/ACT inhaler Inhale 2 puffs into the lungs every 4 (four) hours as needed for wheezing or shortness of breath. 11/12/19   Meccariello, Solmon Ice,  MD  calcium carbonate (TUMS - DOSED IN MG ELEMENTAL CALCIUM) 500 MG chewable tablet Chew 1 tablet by mouth daily as needed for indigestion or heartburn.    [provider]  famotidine (PEPCID) 20 MG tablet TAKE 1 TABLET(20 MG) BY MOUTH TWICE DAILY 10/05/22   Lilland, Alana, DO  ibuprofen (ADVIL) 100 MG tablet Take 100 mg by mouth every 6 (six) hours as needed for fever.    [provider]  penicillin v potassium (VEETID) 500 MG tablet Take 1 tablet (500 mg total) by mouth 4 (four) times daily. 12/17/22   Roxy Horseman, PA-C  rizatriptan (MAXALT) 10 MG tablet Take 1 tablet earliest onset of migraine.  May repeat in 2 hours if needed.  Maximum 2 tablets in 24 hours. 03/27/22   Drema Dallas, DO  rosuvastatin (CRESTOR) 10 MG tablet Take 1 tablet (10 mg total) by mouth daily. Patient not taking: Reported on 06/06/2022 11/12/19   Meccariello, Solmon Ice, MD  terbinafine (LAMISIL AT) 1 % cream Apply 1 application topically 2 (two) times daily. Patient not taking: Reported on 06/06/2022 04/28/21   Lilland, Alana, DO  topiramate (TOPAMAX) 100 MG tablet Take 1 tablet (100 mg total) by mouth at bedtime. Patient not taking: Reported on 06/06/2022 03/27/22   Drema Dallas, DO  triamcinolone ointment (KENALOG) 0.1 % Apply 1 Application topically 2 (two) times daily. Use twice daily for 2 weeks. 05/16/22   Evelena Leyden, DO      Allergies    Patient has  no known allergies.    Review of Systems   Review of Systems  Constitutional:  Negative for chills and fever.  Respiratory:  Negative for shortness of breath.   Cardiovascular:  Negative for chest pain.  Gastrointestinal:  Positive for abdominal pain.  Genitourinary:  Positive for dysuria. Negative for difficulty urinating and penile discharge.  All other systems reviewed and are negative.   Physical Exam Updated Vital Signs BP (!) 145/89 (BP Location: Left Arm)   Pulse 76   Temp 97.9 F (36.6 C) (Oral)   Resp 18   Ht 5\' 11"  (1.803 m)    Wt 90.4 kg   SpO2 95%   BMI 27.80 kg/m  Physical Exam Vitals and nursing note reviewed.  Constitutional:      General: He is not in acute distress.    Appearance: He is not ill-appearing.  HENT:     Head: Normocephalic.  Eyes:     Pupils: Pupils are equal, round, and reactive to light.  Cardiovascular:     Rate and Rhythm: Normal rate and regular rhythm.     Pulses: Normal pulses.     Heart sounds: Normal heart sounds. No murmur heard.    No friction rub. No gallop.  Pulmonary:     Effort: Pulmonary effort is normal.     Breath sounds: Normal breath sounds.  Abdominal:     General: Abdomen is flat. There is no distension.     Palpations: Abdomen is soft.     Tenderness: There is abdominal tenderness. There is no guarding or rebound.     Comments: Lower abdominal tenderness with voluntary guarding.  No CVA tenderness bilaterally.  Genitourinary:    Comments: GU exam performed with chaperone in room.  No testicular tenderness or edema.  No penile discharge.  No lesions.  Normal circumcised penis. Musculoskeletal:        General: Normal range of motion.     Cervical back: Neck supple.  Skin:    General: Skin is warm and dry.  Neurological:     General: No focal deficit present.     Mental Status: He is alert.  Psychiatric:        Mood and Affect: Mood normal.        Behavior: Behavior normal.     ED Results / Procedures / Treatments   Labs (all labs ordered are listed, but only abnormal results are displayed) Labs Reviewed  URINALYSIS, ROUTINE W REFLEX MICROSCOPIC - Abnormal; Notable for the following components:      Result Value   Color, Urine AMBER (*)    Nitrite POSITIVE (*)    All other components within normal limits  CBC WITH DIFFERENTIAL/PLATELET - Abnormal; Notable for the following components:   WBC 15.5 (*)    Neutro Abs 11.1 (*)    Monocytes Absolute 1.6 (*)    All other components within normal limits  COMPREHENSIVE METABOLIC PANEL - Abnormal; Notable  for the following components:   Sodium 134 (*)    Glucose, Bld 109 (*)    All other components within normal limits  URINE CULTURE  LIPASE, BLOOD  GC/CHLAMYDIA PROBE AMP ([place]) NOT AT [facility]    EKG None  Radiology CT ABDOMEN PELVIS W CONTRAST  Result Date: 01/10/2023 CLINICAL DATA:  One day history of mid lower abdominal pain associated with dysuria EXAM: CT ABDOMEN AND PELVIS WITH CONTRAST TECHNIQUE: Multidetector CT imaging of the abdomen and pelvis was performed using the standard protocol following bolus administration  of intravenous contrast. RADIATION DOSE REDUCTION: This exam was performed according to the departmental dose-optimization program which includes automated exposure control, adjustment of the mA and/or kV according to patient size and/or use of iterative reconstruction technique. CONTRAST:  OMNIPAQUE IOHEXOL 300 MG/ML  SOLN COMPARISON:  CT abdomen and pelvis dated 12/24/2019 FINDINGS: Lower chest: Diffuse subsegmental atelectasis in the lung bases. No pleural effusion or pneumothorax demonstrated. Partially imaged heart size is normal. Hepatobiliary: 6.1 x 4.6 cm lobulated cystic focus in segment 4A (2:13) is increased in size from 5.2 x 3.7 cm on 12/24/2019, and 3.5 x 2.6 cm in segment 4B (2:20) is not substantially changed. No intra or extrahepatic biliary ductal dilation. Normal gallbladder. Pancreas: No focal lesions or main ductal dilation. Spleen: Normal in size without focal abnormality. Adrenals/Urinary Tract: No adrenal nodules. No hydronephrosis or suspicious renal masses. 2 mm nonobstructing distal right ureteral stone (2:76). No focal bladder wall thickening. Stomach/Bowel: Normal appearance of the stomach. Colonic diverticulosis with mural thickening and pericolonic stranding in the sigmoid colon. Appendectomy. Vascular/Lymphatic: No significant vascular findings are present. No enlarged abdominal or pelvic lymph nodes. Reproductive: Prostate is unremarkable.  Other: No free fluid, fluid collection, or free air. Musculoskeletal: No acute or abnormal lytic or blastic osseous lesions. IMPRESSION: 1. Acute uncomplicated sigmoid diverticulitis. 2. Nonobstructing 2 mm distal right ureteral stone. Electronically Signed   By: Agustin Cree M.D.   On: 01/10/2023 09:56    Procedures Procedures    Medications Ordered in ED Medications  sodium chloride 0.9 % bolus 1,000 mL (1,000 mLs Intravenous New Bag/Given 01/10/23 0740)  acetaminophen (TYLENOL) tablet 650 mg (650 mg Oral Given 01/10/23 0727)  iohexol (OMNIPAQUE) 300 MG/ML solution 100 mL (100 mLs Intravenous Contrast Given 01/10/23 0932)    ED Course/ Medical Decision Making/ A&P                             Medical Decision Making Amount and/or Complexity of Data Reviewed Labs: ordered. Decision-making details documented in ED Course. Radiology: ordered and independent interpretation performed. Decision-making details documented in ED Course.  Risk OTC drugs. Prescription drug management.   This patient presents to the ED for concern of lower abdominal pain/dysuria, this involves an extensive number of treatment options, and is a complaint that carries with it a high risk of complications and morbidity.  The differential diagnosis includes acute cystitis, pyelonephritis, appendicitis, diverticulitis, STI, etc  62 year old male presents to the ED due to lower abdominal pain and dysuria that started yesterday.  He has been taking over-the-counter AZO with no improvement in symptoms.  No fever or chills.  Has a urology appointment at 3 PM today however, was unable to wait due to pain.  Upon arrival, patient afebrile, not tachycardic or hypoxic.  Patient in no acute distress.  Physical exam significant for lower abdominal tenderness with voluntary guarding.  No rebound.  No CVA tenderness.  Routine labs ordered.  UA to rule out UTI.  CT abdomen to rule out kidney stone versus evidence of pyelonephritis versus  appendicitis or other etiologies of pain. Tylenol and IVFs given.  UA positive for nitrites.  No leukocytes.  No bacteria.  Nitrite positive likely due to AZO.  No evidence of infection.  CBC significant for leukocytosis of 15.5.  Normal hemoglobin.  CMP reassuring.  Normal renal function.  No major electrolyte derangements.  Lipase normal at 33.  Doubt pancreatitis.  CT abdomen personally reviewed and interpreted  which demonstrates diverticulitis and a 2 mm distal right ureteral stone. Low suspicion for infected stone given reassuring UA.  Discharged with Augmentin for diverticulitis and Flomax and pain medication for stone.  Advised patient to report to his urology appointment at 3 PM today for further evaluation. Strict ED precautions discussed with patient. Patient states understanding and agrees to plan. Patient discharged home in no acute distress and stable vitals  Has PCP Hx BPH       Final Clinical Impression(s) / ED Diagnoses Final diagnoses:  Diverticulitis  Ureteral stone    Rx / DC Orders ED Discharge Orders          Ordered    amoxicillin-clavulanate (AUGMENTIN) 875-125 MG tablet  3 times daily        01/10/23 1014    tamsulosin (FLOMAX) 0.4 MG CAPS capsule  Daily        01/10/23 1014    HYDROcodone-acetaminophen (NORCO/VICODIN) 5-325 MG tablet  Every 6 hours PRN        01/10/23 1014              Jesusita Oka 01/10/23 1033    Lorre Nick, MD 01/11/23 2315

## 2023-01-10 NOTE — ED Triage Notes (Signed)
Patient arrived with mid lower abdominal pain that started yesterday and burning after urination, scheduled to see urology today. Considered for an UTI.

## 2023-01-11 LAB — URINE CULTURE: Culture: NO GROWTH

## 2023-01-11 LAB — GC/CHLAMYDIA PROBE AMP (~~LOC~~) NOT AT ARMC
Chlamydia: NEGATIVE
Comment: NEGATIVE
Comment: NORMAL
Neisseria Gonorrhea: NEGATIVE

## 2023-01-25 ENCOUNTER — Encounter: Payer: Self-pay | Admitting: Family Medicine

## 2023-01-25 ENCOUNTER — Ambulatory Visit (INDEPENDENT_AMBULATORY_CARE_PROVIDER_SITE_OTHER): Payer: Medicaid Other | Admitting: Family Medicine

## 2023-01-25 VITALS — BP 159/94 | HR 80 | Wt 201.8 lb

## 2023-01-25 DIAGNOSIS — K5900 Constipation, unspecified: Secondary | ICD-10-CM | POA: Diagnosis not present

## 2023-01-25 DIAGNOSIS — R030 Elevated blood-pressure reading, without diagnosis of hypertension: Secondary | ICD-10-CM | POA: Insufficient documentation

## 2023-01-25 DIAGNOSIS — K5792 Diverticulitis of intestine, part unspecified, without perforation or abscess without bleeding: Secondary | ICD-10-CM | POA: Diagnosis present

## 2023-01-25 DIAGNOSIS — Z1211 Encounter for screening for malignant neoplasm of colon: Secondary | ICD-10-CM | POA: Diagnosis not present

## 2023-01-25 MED ORDER — POLYETHYLENE GLYCOL 3350 17 GM/SCOOP PO POWD: 17.0000 g | Freq: Every day | ORAL | 0 refills | Status: DC

## 2023-01-25 NOTE — Assessment & Plan Note (Addendum)
BP elevated x 2 today.  No prior diagnosis of hypertension but I do see where he has had multiple elevated pressures in the past.  Scheduled with PCP in 2 weeks for follow-up.

## 2023-01-25 NOTE — Patient Instructions (Addendum)
It was great to meet you!  For your constipation, start taking Miralax 1 capful mixed in 8oz water once daily.  I placed a referral to the GI doctor for your colonoscopy (screening for colon cancer). Someone will call you for an appointment.  Your blood pressure was elevated today. I have scheduled you a follow-up visit with your PCP, Dr Clayborne Artist, in 2 weeks to discuss further.  I have also scheduled you an appointment with Dr Raymondo Band for assistance with quitting smoking.  Take care, Dr Anner Crete

## 2023-01-25 NOTE — Assessment & Plan Note (Signed)
Diagnosed in the ED on 01/10/2023 and treated with Augmentin.  Symptoms resolved.  Benign abdominal exam today.  History of the same dating back to 2019 per chart review.  Has never had colonoscopy, referral placed to GI today.  Treat constipation as below

## 2023-01-25 NOTE — Progress Notes (Signed)
    SUBJECTIVE:   CHIEF COMPLAINT / HPI:   ED follow-up Seen in the ED on 01/10/2023 for abdominal pain and dysuria.  Had CT abdomen which showed diverticulitis as well as 2mm right distal ureteral stone. UA did not suggest UTI. Treated with Augmentin for his diverticulitis.  Reports doing well since then.  Completed the Augmentin.  Abdominal pain is improved.  However he does note constipation and some lower abdominal pain when he is having a hard bowel movement.  No blood in his stool.  No nausea/vomiting or fever.  Notes that he saw his urologist yesterday--they continued him on flomax  Tobacco abuse Interested in quitting and states he would like our assistance with this  PERTINENT  PMH / PSH: BPH, HLD, schizophrenia (self-reported)  OBJECTIVE:   BP (!) 159/94   Pulse 80   Wt 201 lb 12.8 oz (91.5 kg)   SpO2 100%   BMI 28.15 kg/m   General: NAD, pleasant, able to participate in exam Respiratory: No respiratory distress GI: Abdomen soft, nontender, nondistended.  Normoactive bowel sounds Skin: warm and dry, no rashes noted Psych: well-groomed, appropriate eye contact, speech somewhat slow but otherwise normal, does not appear to be responding to internal stimuli Neuro: grossly intact   ASSESSMENT/PLAN:   Diverticulitis Diagnosed in the ED on 01/10/2023 and treated with Augmentin.  Symptoms resolved.  Benign abdominal exam today.  History of the same dating back to 2019 per chart review.  Has never had colonoscopy, referral placed to GI today.  Treat constipation as below  Constipation MiraLAX daily.  Return if no improvement and recommend adding senna at that time as appropriate.  Colon cancer screening Had negative FOBT in 2022.  Has not been screened again since then.  Never had colonoscopy.  Referral placed to GI for colonoscopy for screening purposes as well as for his history of diverticulitis.  Elevated blood pressure reading BP elevated x 2 today.  No prior  diagnosis of hypertension but I do see where he has had multiple elevated pressures in the past.  Scheduled with PCP in 2 weeks for follow-up.     Maury Dus, MD [facility]

## 2023-01-25 NOTE — Assessment & Plan Note (Signed)
Had negative FOBT in 2022.  Has not been screened again since then.  Never had colonoscopy.  Referral placed to GI for colonoscopy for screening purposes as well as for his history of diverticulitis.

## 2023-01-25 NOTE — Assessment & Plan Note (Signed)
MiraLAX daily.  Return if no improvement and recommend adding senna at that time as appropriate.

## 2023-02-01 ENCOUNTER — Ambulatory Visit: Payer: Medicaid Other | Admitting: Pharmacist

## 2023-02-08 ENCOUNTER — Other Ambulatory Visit: Payer: Self-pay | Admitting: Family Medicine

## 2023-02-08 ENCOUNTER — Ambulatory Visit: Payer: Medicaid Other | Admitting: Family Medicine

## 2023-02-08 ENCOUNTER — Encounter: Payer: Self-pay | Admitting: Family Medicine

## 2023-02-08 VITALS — BP 133/88 | HR 76 | Ht 71.0 in | Wt 202.0 lb

## 2023-02-08 DIAGNOSIS — R030 Elevated blood-pressure reading, without diagnosis of hypertension: Secondary | ICD-10-CM | POA: Diagnosis not present

## 2023-02-08 DIAGNOSIS — E785 Hyperlipidemia, unspecified: Secondary | ICD-10-CM | POA: Diagnosis present

## 2023-02-08 NOTE — Assessment & Plan Note (Signed)
Patient has hyperlipidemia and worsening medications.  Has not been taking the rosuvastatin for several months.  Patient is also averse to needles that has been difficult to get annual checks on him.  He is agreeable to lipid panel checked next week so that we can determine his ASCVD risk and determine if statins would still be appropriate. - Future order for lipid panel

## 2023-02-08 NOTE — Assessment & Plan Note (Signed)
BP closer to normotensive range and given patient's reluctance for the fact that he would be amenable to starting any at this time. - Continue to monitor

## 2023-02-08 NOTE — Progress Notes (Signed)
    SUBJECTIVE:   CHIEF COMPLAINT / HPI:   BP check - Was elevated at the last few visits - Patient is hesitant to take any medications  Jury duty - Wants request for removal for jury duty - has been mentally disabled since 2018, does not have his Social Security records with him   PERTINENT  PMH / PSH: Reviewed  OBJECTIVE:   BP 133/88   Pulse 76   Ht 5\' 11"  (1.803 m)   Wt 202 lb (91.6 kg)   SpO2 98%   BMI 28.17 kg/m   General: NAD, well-appearing, well-nourished Respiratory: No respiratory distress, breathing comfortably, able to speak in full sentences Skin: warm and dry, no rashes noted on exposed skin Psych: Appropriate affect and mood  ASSESSMENT/PLAN:   Elevated blood pressure reading BP closer to normotensive range and given patient's reluctance for the fact that he would be amenable to starting any at this time. - Continue to monitor  Hyperlipidemia Patient has hyperlipidemia and worsening medications.  Has not been taking the rosuvastatin for several months.  Patient is also averse to needles that has been difficult to get annual checks on him.  He is agreeable to lipid panel checked next week so that we can determine his ASCVD risk and determine if statins would still be appropriate. - Future order for lipid panel   Mental disability Patient reports that Social Security services evaluation previously for mental disability and he has been approved since 2018.  He has been called in for jury duty and is needing a physician's note to get out of it.  Discussed with patient that I am not sure if I could legally provide a note for duty removal without records that would indicate a sufficient reason to excuse him. - Record release for Social Security services requested   Evelena Leyden, DO [facility]

## 2023-02-08 NOTE — Patient Instructions (Signed)
Next week we are going to check your cholesterol levels as that we will give [place] the percent risk you have of any kind of heart attack or stroke in the next 10 years.  Based on that number, we would decide whether or not we really need to pursue you having a statin/cholesterol medication.  We can get records from the Social Security office regarding your disability.  I cannot guarantee that the disability reasons will legally let us sign for jury duty, but I will let you know once we have those records.

## 2023-02-13 ENCOUNTER — Other Ambulatory Visit: Payer: Self-pay

## 2023-02-13 ENCOUNTER — Telehealth: Payer: Self-pay

## 2023-02-13 NOTE — Telephone Encounter (Signed)
Faxed release to [employer]   Patient reports that court date is on Friday and needs as soon as possible. Advised that we would let him know once we had an update on letter.   Veronda Prude, RN

## 2023-02-13 NOTE — Telephone Encounter (Signed)
Patient calls nurse line to check status of getting jury duty excuse letter.   He states that he filled out release of medical records for social security at last visit with provider.   Checked with Nehemiah Settle to determine status. She has not received this release or any records.   Will forward to Dr. Clayborne Artist and blue team for further clarification.   Veronda Prude, RN

## 2023-02-14 NOTE — Telephone Encounter (Signed)
ROI was given to [employer]. tubal ligation

## 2023-02-16 ENCOUNTER — Other Ambulatory Visit: Payer: Medicaid Other

## 2023-02-16 DIAGNOSIS — E785 Hyperlipidemia, unspecified: Secondary | ICD-10-CM

## 2023-02-17 LAB — LIPID PANEL
Chol/HDL Ratio: 4.5 ratio (ref 0.0–5.0)
Cholesterol, Total: 166 mg/dL (ref 100–199)
HDL: 37 mg/dL — ABNORMAL LOW (ref >39)
LDL Chol Calc (NIH): 113 mg/dL — ABNORMAL HIGH (ref 0–99)
Triglycerides: 82 mg/dL (ref 0–149)
VLDL Cholesterol Cal: 16 mg/dL (ref 5–40)

## 2023-02-22 ENCOUNTER — Telehealth: Payer: Self-pay | Admitting: Family Medicine

## 2023-02-22 DIAGNOSIS — E785 Hyperlipidemia, unspecified: Secondary | ICD-10-CM

## 2023-02-22 MED ORDER — ROSUVASTATIN CALCIUM 10 MG PO TABS: 10.0000 mg | ORAL_TABLET | Freq: Every day | ORAL | 3 refills | Status: DC

## 2023-02-22 NOTE — Telephone Encounter (Signed)
Patient's lipid panel with elevated cholesterol.  Called patient's phone and left voicemail for him to call back regarding the results.  His cholesterol level and his other risk factors have his ASCVD score risk increased to 16% chance of cardiac event in the next 10 years.  Previously recommended for patient to restart his rosuvastatin, I will resend the prescription and if patient calls back recommend that he pick this up.   Ethan Russett, DO

## 2023-03-27 NOTE — Progress Notes (Unsigned)
NEUROLOGY FOLLOW UP OFFICE NOTE  Ethan Williams [id number]  Assessment/Plan:   1  Migraine with aura, without status migrainosus, not intractable/ocular migraines 2  Cervical spinal stenosis 3. Left-sided occipital neuralgia   1.Migraine prevention:  Defers.  I migraines continue to increase in frequency, he will contact me and we will start a different preventative. 3. Migraine rescue:  rizatriptan 10mg  4.Limit use of pain relievers to no more than 2 days out of week to prevent risk of rebound or medication-overuse headache. 5.  Keep headache diary 6.  Follow up 6 months.   Subjective:  Ethan Williams is a 62 year old right-handed Caucasian male who follows up for migraines.   UPDATE: He discontinued topiramate a year ago.  He did develop kidney stones several months later.  Over the past few months, he has had gradual increase in migraines.  Visual aura of double vision, bright light spot and dizziness.  Lasts 10-15 minutes.  Sometimes followed by headache but less severe.  Sometimes has ocular migraines with spot in the left side of his visual field with fireworks.  Headache lasts an hour with rizatriptan.  Averaging one migraine a week, 2 associated with headache.  Does report increased stress over the past few months.  Going to see the eye doctor.    Current NSAIDS:  Advil Current analgesics:  none Current triptans:  rizatriptan 10mg  Current ergotamine:  none Current anti-emetic:  none Current muscle relaxants:  none Current anti-anxiolytic:  none Current sleep aide:  none Current Antihypertensive medications:  none Current Antidepressant medications:  none Current Anticonvulsant medications: none Current anti-CGRP:  none Current Vitamins/Herbal/Supplements:  MVI Current Antihistamines/Decongestants:  none Other therapy:  none Hormone/birth control:  none   Caffeine:  Stopped coffee and smoking intake, which has helped.  Headache and neck pain improved.  Restarted  and headaches/neck pain worse.   Diet:  Does not drink much water.  A little bit of soda. Exercise:  Not routine Depression:  no; Anxiety:  no Other pain: Has been dealing with low back pain radiating into left groin.  Saw his orthopedist.  Received cortisone injection which has helped. Sleep hygiene:  okay   HISTORY:  He started having migraines since 2013.  He starts with fatigue, slurred speech and visual aura with blurred vision, spots, crescent shapes, flashes and lines for 30 minutes followed by severe stabbing occipital headache (either side but usually left-sided) radiating to the front of head lasting a 1 to 2 hours with sumatriptan, that lingers for a couple of days.  Associated with photophobia and phonophobia but not really nausea or vomiting.  Residual headache for 2 to 3 days.  Occurs every 2 weeks.  Coughing aggravates it.  Resting in dark and quiet room helps.     MRI and MRA of brain from 12/14/2011 were normal. MRI of cervical spine without contrast from 08/19/2018 showed moderately large C6-7 disc extrusion resulting in moderate spinal stenosis and moderate left neural foraminal stenosis, C5-6 disc degeneration and left parcentral protrusion resulting in moderate spinal stenosis and severe left neural foraminal stenosis, and moderate right neural foraminal stenosis at C3-4 and C4-5.  Underwent ACDF 5-6 and 6-7 in 2020.  Still has residual numbness in left arm and sometimes perioral numbness and numbness in the jaw bilateral.  Due to worsening migraines, MRI of brain with and without contrast was performed on 03/12/2021, which was unremarkable.   Past NSAIDS/steroids:  naproxen Past analgesics:  Tylenol; Fiorciet; tramadol Past abortive triptans:  sumatriptan tab, rizatriptan Past abortive ergotamine:  none Past muscle relaxants:  baclofen Past anti-emetic:  none Past antihypertensive medications:  none Past antidepressant medications:  none Past anticonvulsant medications:   topiramate, gabapentin Past anti-CGRP:  Bernita Raisin Past vitamins/Herbal/Supplements:  none Other past therapies:  Coffee     Family history of headache:  no   History of concussion History of cervical spinal stenosis s/p fusion in February 2020.  PAST MEDICAL HISTORY: Past Medical History:  Diagnosis Date   Anginal pain (HCC)    Anxiety    Arthritis    "right shoulder" (06/06/2012), "finger joints"   Daily headache    "here lately they've been daily" (06/06/2012), no current problems per patient 06/16/19   Depression    Dysrhythmia    "palpitations" (06/06/2012)   External hemorrhoid, bleeding    GERD (gastroesophageal reflux disease)    Heart murmur    "as a child" (06/06/2012), no problems   Hyperlipidemia    Migraines    "treated for them in the last 6 months (06/06/2012), none since 2013   Pneumonia 2012; 06/06/2012   Seasonal allergies    "outdoor; pollen, grass" (06/06/2012)   Shortness of breath 06/06/2012   "at rest; lying down; w/exertion", no current problems per patient 10/12/200   Substance abuse (HCC)    hx - clean since 2010 per patient 06/16/19    MEDICATIONS: Current Outpatient Medications on File Prior to Visit  Medication Sig Dispense Refill   acetaminophen (TYLENOL) 500 MG tablet Take 1 tablet (500 mg total) by mouth every 6 (six) hours as needed. 30 tablet 0   albuterol (VENTOLIN HFA) 108 (90 Base) MCG/ACT inhaler Inhale 2 puffs into the lungs every 4 (four) hours as needed for wheezing or shortness of breath. (Patient not taking: Reported on 01/25/2023) 18 g 3   calcium carbonate (TUMS - DOSED IN MG ELEMENTAL CALCIUM) 500 MG chewable tablet Chew 1 tablet by mouth daily as needed for indigestion or heartburn.     famotidine (PEPCID) 20 MG tablet TAKE 1 TABLET(20 MG) BY MOUTH TWICE DAILY 180 tablet 1   ibuprofen (ADVIL) 100 MG tablet Take 100 mg by mouth every 6 (six) hours as needed for fever.     polyethylene glycol powder (GLYCOLAX/MIRALAX) 17 GM/SCOOP powder  Take 17 g by mouth daily. 850 g 0   rizatriptan (MAXALT) 10 MG tablet Take 1 tablet earliest onset of migraine.  May repeat in 2 hours if needed.  Maximum 2 tablets in 24 hours. 10 tablet 5   rosuvastatin (CRESTOR) 10 MG tablet Take 1 tablet (10 mg total) by mouth daily. 90 tablet 3   tamsulosin (FLOMAX) 0.4 MG CAPS capsule Take 1 capsule (0.4 mg total) by mouth daily. 14 capsule 0   terbinafine (LAMISIL AT) 1 % cream Apply 1 application topically 2 (two) times daily. (Patient not taking: Reported on 06/06/2022) 30 g 0   topiramate (TOPAMAX) 100 MG tablet Take 1 tablet (100 mg total) by mouth at bedtime. (Patient not taking: Reported on 06/06/2022) 30 tablet 5   triamcinolone ointment (KENALOG) 0.1 % Apply 1 Application topically 2 (two) times daily. Use twice daily for 2 weeks. 30 g 0   No current facility-administered medications on file prior to visit.    ALLERGIES: No Known Allergies  FAMILY HISTORY: Family History  Problem Relation Age of Onset   Cancer Father    Colon cancer Neg Hx    Rectal cancer Neg Hx    Prostate cancer Neg Hx  Objective:  Blood pressure 136/79, pulse 80, height 5\' 10"  (1.778 m), weight 204 lb 9.6 oz (92.8 kg), SpO2 96%. General: No acute distress.  Patient appears well-groomed.   Head:  Normocephalic/atraumatic Eyes:  Fundi examined but not visualized Neck: supple, no paraspinal tenderness, full range of motion Heart:  Regular rate and rhythm Neurological Exam: alert and oriented.  Speech fluent and not dysarthric, language intact.  CN II-XII intact. Bulk and tone normal, muscle strength 5/5 throughout.  Sensation to light touch intact.  Deep tendon reflexes 2+ throughout.  Finger to nose testing intact.  Gait normal, Romberg negative.   Shon Millet, DO  CC: Evelena Leyden, DO

## 2023-03-28 ENCOUNTER — Ambulatory Visit (INDEPENDENT_AMBULATORY_CARE_PROVIDER_SITE_OTHER): Payer: MEDICAID | Admitting: Neurology

## 2023-03-28 ENCOUNTER — Encounter: Payer: Self-pay | Admitting: Neurology

## 2023-03-28 VITALS — BP 136/79 | HR 80 | Ht 70.0 in | Wt 204.6 lb

## 2023-03-28 DIAGNOSIS — M5481 Occipital neuralgia: Secondary | ICD-10-CM | POA: Diagnosis not present

## 2023-03-28 DIAGNOSIS — G43101 Migraine with aura, not intractable, with status migrainosus: Secondary | ICD-10-CM

## 2023-03-28 DIAGNOSIS — G43109 Migraine with aura, not intractable, without status migrainosus: Secondary | ICD-10-CM

## 2023-03-28 DIAGNOSIS — M4802 Spinal stenosis, cervical region: Secondary | ICD-10-CM | POA: Diagnosis not present

## 2023-03-28 NOTE — Patient Instructions (Signed)
rizatriptan

## 2023-04-02 ENCOUNTER — Other Ambulatory Visit: Payer: Self-pay | Admitting: Neurology

## 2023-04-06 ENCOUNTER — Other Ambulatory Visit: Payer: Self-pay

## 2023-04-06 MED ORDER — FAMOTIDINE 20 MG PO TABS: 20.0000 mg | ORAL_TABLET | Freq: Every day | ORAL | 3 refills | Status: DC

## 2023-06-01 ENCOUNTER — Ambulatory Visit (INDEPENDENT_AMBULATORY_CARE_PROVIDER_SITE_OTHER): Payer: MEDICAID | Admitting: Gastroenterology

## 2023-06-01 ENCOUNTER — Encounter: Payer: Self-pay | Admitting: Gastroenterology

## 2023-06-01 VITALS — BP 138/78 | HR 86 | Ht 72.0 in | Wt 202.0 lb

## 2023-06-01 DIAGNOSIS — K219 Gastro-esophageal reflux disease without esophagitis: Secondary | ICD-10-CM | POA: Diagnosis not present

## 2023-06-01 DIAGNOSIS — R1319 Other dysphagia: Secondary | ICD-10-CM | POA: Insufficient documentation

## 2023-06-01 DIAGNOSIS — Z87898 Personal history of other specified conditions: Secondary | ICD-10-CM | POA: Insufficient documentation

## 2023-06-01 DIAGNOSIS — K7689 Other specified diseases of liver: Secondary | ICD-10-CM | POA: Insufficient documentation

## 2023-06-01 DIAGNOSIS — Z8719 Personal history of other diseases of the digestive system: Secondary | ICD-10-CM | POA: Insufficient documentation

## 2023-06-01 MED ORDER — SODIUM CHLORIDE 0.9 % IV SOLN: INTRAVENOUS | Status: DC

## 2023-06-01 MED ORDER — FAMOTIDINE 20 MG PO TABS: 20.0000 mg | ORAL_TABLET | Freq: Two times a day (BID) | ORAL | 3 refills | Status: DC

## 2023-06-01 NOTE — Patient Instructions (Signed)
We have sent the following medications to your pharmacy for you to pick up at your convenience: Famotidine  You will be due for a recall cologuard  in 2027. We will send you a reminder in the mail when it gets closer to that time.  Follow-up with Dr.Mansouraty in 4-6 months. Office will contact you to schedule at a later time.   _______________________________________________________  If your blood pressure at your visit was 140/90 or greater, please contact your primary care physician to follow up on this.  _______________________________________________________  If you are age 18 or older, your body mass index should be between 23-30. Your Body mass index is 27.4 kg/m. If this is out of the aforementioned range listed, please consider follow up with your Primary Care Provider.  If you are age 75 or younger, your body mass index should be between 19-25. Your Body mass index is 27.4 kg/m. If this is out of the aformentioned range listed, please consider follow up with your Primary Care Provider.   ________________________________________________________  The [facility] providers would like to encourage you to use [facility] to communicate with providers for non-urgent requests or questions.  Due to long hold times on the telephone, sending your provider a message by [facility] may be a faster and more efficient way to get a response.  Please allow 48 business hours for a response.  Please remember that this is for non-urgent requests.  _______________________________________________________  Thank you for choosing me and [facility].  Dr. Meridee Score

## 2023-06-01 NOTE — Progress Notes (Unsigned)
GASTROENTEROLOGY OUTPATIENT CLINIC VISIT   Primary Care Provider Evette Georges, MD [address] [place] [address] [phone]  Referring Provider Evette Georges, MD [address] [place],  [place] [address] [phone]  Patient Profile: Wayland Baik is a 62 y.o. male with a pmh significant for hyperlipidemia, migraines, nephrolithiasis, MDD, anxiety, BPH, status post appendectomy, diverticulosis (with episode of prior diverticulitis), GERD, liver cyst, prior alcohol use disorder.  The patient presents to the [facility] for an evaluation and management of problem(s) noted below:  Problem List 1. Liver cyst   2. Gastroesophageal reflux disease, unspecified whether esophagitis present   3. History of diverticulitis   4. Other dysphagia   5. History of alcohol use disorder     Discussed the use of AI scribe software for clinical note transcription with the patient, who gave verbal consent to proceed.  History of Present Illness This is the patient's first visit to the [facility] GI clinic.  He was diagnosed with diverticulitis of the sigmoid colon in May of this year after presenting with pain in the LLQ.  At the same time he was found to have a kidney stone on the left side.  He was treated with antibiotics for his diverticulitis and within a few weeks he had improvement.  He reports no current symptoms and has had regular bowel habits since then.  In the past he has dealt with infrequent episodes of constipation which have led to hemorrhoid flare-ups and noted toilet paper blood at times but not true hematochezia.  He was supposed to undergo a colonoscopy back in 2020 but ended up deferring on this.  He reports a 2023 negative Cologuard test however done by his PCP.  He wonders as to whether he needs a colonoscopy.  The patient also reports a long-standing history of heartburn, which he has been managing with famotidine. He has been on this medication for over 20  years, initially starting with ranitidine (Zantac) which was later switched to famotidine.  He has been on twice daily Famotidine and this controlled his symptoms relatively well without overt breakthrough unless he did not follow his food/diet closely.  However, for some reason his PCP prescription had been decreased to once daily and now he has been out of this for the last 5-days and has really had significant worsening of his symptoms with mild dysphagia.  He hopes to get back on this soon.  He has never had an upper endoscopy to evaluate his esophagus for Barrett's screeening.        The patient does/does not take NSAIDs or BC/Goody Powder. Patient has/has not had an EGD. Patient has/has not had a Colonoscopy.  GI Review of Systems Positive as above Negative for  Pyrosis; Reflux; Regurgitation; Dysphagia; Odynophagia; Globus; Post-prandial cough; Nocturnal cough; Nasal regurgitation; Epigastric pain; Nausea; Vomiting; Hematemesis; Jaundice; Change in Appetite; Early satiety; Abdominal pain; Abdominal bloating; Eructation; Flatulence; Change in BM Frequency; Change in BM Consistency; Constipation; Diarrhea; Incontinence; Urgency; Tenesmus; Hematochezia; Melena  Review of Systems General: Denies fevers/chills/weight loss/night sweats HEENT: Denies oral lesions/sore throat/headaches/visual changes Cardiovascular: Denies chest pain/palpitations Pulmonary: Denies shortness of breath/cough Gastroenterological: See HPI Genitourinary: Denies darkened urine or hematuria Hematological: Denies easy bruising/bleeding Endocrine: Denies temperature intolerance Dermatological: Denies skin changes Psychological: Mood is stable Allergy & Immunology: Denies severe allergic reactions Musculoskeletal: Denies new arthralgias   Medications Current Outpatient Medications  Medication Sig Dispense Refill   acetaminophen (TYLENOL) 500 MG tablet Take 1 tablet (500 mg total) by  mouth every 6 (six) hours as  needed. 30 tablet 0   albuterol (VENTOLIN HFA) 108 (90 Base) MCG/ACT inhaler Inhale 2 puffs into the lungs every 4 (four) hours as needed for wheezing or shortness of breath. 18 g 3   calcium carbonate (TUMS - DOSED IN MG ELEMENTAL CALCIUM) 500 MG chewable tablet Chew 1 tablet by mouth daily as needed for indigestion or heartburn.     polyethylene glycol powder (GLYCOLAX/MIRALAX) 17 GM/SCOOP powder Take 17 g by mouth daily. (Patient taking differently: Take 17 g by mouth daily as needed.) 850 g 0   rizatriptan (MAXALT) 10 MG tablet TAKE 1 TABLET BY MOUTH AT EARLIEST ONSET OF MIGRAINE. MAY REPEAT IN 2 HOURS IF NEEDED. MAXIMUM 2 TABLETS IN 24 HOURS 10 tablet 5   rosuvastatin (CRESTOR) 10 MG tablet Take 1 tablet (10 mg total) by mouth daily. 90 tablet 3   tamsulosin (FLOMAX) 0.4 MG CAPS capsule Take 1 capsule (0.4 mg total) by mouth daily. 14 capsule 0   famotidine (PEPCID) 20 MG tablet Take 1 tablet (20 mg total) by mouth 2 (two) times daily. 180 tablet 3   No current facility-administered medications for this visit.    Allergies No Known Allergies  Histories Past Medical History:  Diagnosis Date   Anginal pain (HCC)    Anxiety    Arthritis    "right shoulder" (06/06/2012), "finger joints"   Daily headache    "here lately they've been daily" (06/06/2012), no current problems per patient 06/16/19   Depression    Diverticulitis    Dysrhythmia    "palpitations" (06/06/2012)   External hemorrhoid, bleeding    GERD (gastroesophageal reflux disease)    Heart murmur    "as a child" (06/06/2012), no problems   Hyperlipidemia    Kidney stone    Migraines    "treated for them in the last 6 months (06/06/2012), none since 2013   Pneumonia 2012; 06/06/2012   Seasonal allergies    "outdoor; pollen, grass" (06/06/2012)   Shortness of breath 06/06/2012   "at rest; lying down; w/exertion", no current problems per patient 10/12/200   Substance abuse (HCC)    hx - clean since 2010 per patient 06/16/19    Past Surgical History:  Procedure Laterality Date   ANTERIOR CERVICAL DECOMP/DISCECTOMY FUSION N/A 10/23/2018   Procedure: ANTERIOR CERVICAL DECOMPRESSION FUSION, CERVICAL 5-6, CERVICAL 6-7 , WITH POSSIBLE C6-7 CORPECTOMY;  Surgeon: Estill Bamberg, MD;  Location: [facility] OR;  Service: Orthopedics;  Laterality: N/A;   APPENDECTOMY  2001   POSTERIOR CERVICAL FUSION/FORAMINOTOMY Bilateral 10/24/2018   Procedure: CERVICAL FIVE-SEVEN POSTERIOR SPINAL FUSION WITH INSTRUMENTATION AND ALLOGRAFT;  Surgeon: Estill Bamberg, MD;  Location: [facility] OR;  Service: Orthopedics;  Laterality: Bilateral;   SHOULDER SURGERY Right    WISDOM TOOTH EXTRACTION     Social History   Socioeconomic History   Marital status: Single    Spouse name: Not on file   Number of children: 0   Years of education: Not on file   Highest education level: Not on file  Occupational History   Occupation: unemployed  Tobacco Use   Smoking status: Every Day    Current packs/day: 1.00    Average packs/day: 1 pack/day for 45.0 years (45.0 ttl pk-yrs)    Types: Cigarettes   Smokeless tobacco: Never  Vaping Use   Vaping status: Never Used  Substance and Sexual Activity   Alcohol use: No    Comment: 06/06/2012 "History of abuse ; sober since 07/2009"  Drug use: No    Types: Cocaine, Other-see comments, Marijuana, LSD, Heroin    Comment: 06/06/2012 "havd used everything; addicted to cocaine for a time; went to [facility]; been clean since 07/2009"   Sexual activity: Yes  Other Topics Concern   Not on file  Social History Narrative   Lives with Parents   Unemployeed   Social Determinants of Health   Financial Resource Strain: Not on file  Food Insecurity: Not on file  Transportation Needs: Not on file  Physical Activity: Not on file  Stress: Not on file  Social Connections: Not on file  Intimate Partner Violence: Not on file   Family History  Problem Relation Age of Onset   Prostate cancer Father    Colon cancer Neg Hx    Rectal  cancer Neg Hx    Esophageal cancer Neg Hx    I have reviewed his medical, social, and family history in detail and updated the electronic medical record as necessary.    PHYSICAL EXAMINATION  BP 138/78   Pulse 86   Ht 6' (1.829 m)   Wt 202 lb (91.6 kg)   BMI 27.40 kg/m  Wt Readings from Last 3 Encounters:  06/01/23 202 lb (91.6 kg)  03/29/23 204 lb 9.6 oz (92.8 kg)  02/08/23 202 lb (91.6 kg)   GEN: NAD, appears stated age, doesn't appear chronically ill PSYCH: Cooperative, without pressured speech EYE: Conjunctivae pink, sclerae anicteric ENT: MMM, without oral ulcers, no erythema or exudates noted NECK: Supple CV: RR without R/Gs  RESP: CTAB posteriorly, without wheezing GI: NABS, soft, NT/ND, without rebound or guarding, no HSM appreciated GU: DRE shows MSK/EXT: _ edema, no palmar erythema SKIN: No jaundice, no spider angiomata, no concerning rashes NEURO:  Alert & Oriented x 3, no focal deficits, no evidence of asterixis   REVIEW OF DATA  I reviewed the following data at the time of this encounter:  GI Procedures and Studies  ***  Laboratory Studies  ***  Imaging Studies  ***   ASSESSMENT  Mr. Cutbirth is a 62 y.o. male with a pmh significant for The patient is seen today for evaluation and management of:  1. Liver cyst   2. Gastroesophageal reflux disease, unspecified whether esophagitis present   3. History of diverticulitis   4. Other dysphagia   5. History of alcohol use disorder     ***   PLAN  There are no diagnoses linked to this encounter.   Orders Placed This Encounter  Procedures   Diet NPO time specified   Vital signs   CBG on all patients with diabetes   Pre-admission testing diagnosis   Vital signs per Moderate Sedation protocol   Discharge patient to home when patient meets Aldrete score   Discharge instructions   Informed Consent Details: Physician/Practitioner Attestation; Transcribe to consent form and obtain patient signature    Informed Consent Details: Physician/Practitioner Attestation; Transcribe to consent form and obtain patient signature   Oxygen therapy Mode or (Route): Nasal cannula; Liters Per Minute: 2; Keep 02 saturation: 92% or greater   Insert and maintain IV line    New Prescriptions   No medications on file   Modified Medications   Modified Medication Previous Medication   FAMOTIDINE (PEPCID) 20 MG TABLET famotidine (PEPCID) 20 MG tablet      Take 1 tablet (20 mg total) by mouth 2 (two) times daily.    Take 1 tablet (20 mg total) by mouth daily.    Planned Follow  Up No follow-ups on file.   Total Time in Face-to-Face and in Coordination of Care for patient including independent/personal interpretation/review of prior testing, medical history, examination, medication adjustment, communicating results with the patient directly, and documentation within the EHR is ***.   Corliss Parish, MD [facility] Advanced Endoscopy Office # [phone]

## 2023-08-13 ENCOUNTER — Ambulatory Visit: Payer: MEDICAID

## 2023-08-13 DIAGNOSIS — Z23 Encounter for immunization: Secondary | ICD-10-CM

## 2023-08-13 NOTE — Progress Notes (Signed)
Patient presents to nurse clinic for Flu Vaccine.  Vaccine administered without complication.  See admin for details.

## 2023-08-16 SURGERY — ESOPHAGOGASTRODUODENOSCOPY (EGD) WITH PROPOFOL
Anesthesia: Monitor Anesthesia Care

## 2023-09-13 ENCOUNTER — Encounter: Payer: Self-pay | Admitting: Gastroenterology

## 2023-10-08 ENCOUNTER — Ambulatory Visit: Payer: MEDICAID | Admitting: Neurology

## 2023-10-16 ENCOUNTER — Other Ambulatory Visit: Payer: Self-pay | Admitting: Orthopedic Surgery

## 2023-10-16 DIAGNOSIS — M5416 Radiculopathy, lumbar region: Secondary | ICD-10-CM

## 2023-10-29 NOTE — Discharge Instructions (Signed)

## 2023-10-30 ENCOUNTER — Ambulatory Visit
Admission: RE | Admit: 2023-10-30 | Discharge: 2023-10-30 | Disposition: A | Discharge status: Home / Self Care | Payer: MEDICAID | Source: Ambulatory Visit | Attending: Orthopedic Surgery | Admitting: Orthopedic Surgery

## 2023-10-30 DIAGNOSIS — M5416 Radiculopathy, lumbar region: Secondary | ICD-10-CM

## 2023-10-30 MED ORDER — METHYLPREDNISOLONE ACETATE 40 MG/ML INJ SUSP (RADIOLOG
80.0000 mg | Freq: Once | INTRAMUSCULAR | Status: AC
  Administered 2023-10-30: 80 mg via EPIDURAL

## 2023-10-30 MED ORDER — IOPAMIDOL (ISOVUE-M 200) INJECTION 41%
1.0000 mL | Freq: Once | INTRAMUSCULAR | Status: AC
  Administered 2023-10-30: 1 mL via EPIDURAL

## 2023-12-28 ENCOUNTER — Ambulatory Visit: Payer: MEDICAID | Admitting: Gastroenterology

## 2024-02-15 ENCOUNTER — Encounter: Payer: Self-pay | Admitting: Gastroenterology

## 2024-02-15 ENCOUNTER — Ambulatory Visit: Payer: MEDICAID | Admitting: Gastroenterology

## 2024-02-15 VITALS — BP 122/72 | HR 84 | Ht 70.5 in | Wt 200.0 lb

## 2024-02-15 DIAGNOSIS — K219 Gastro-esophageal reflux disease without esophagitis: Secondary | ICD-10-CM

## 2024-02-15 DIAGNOSIS — Z8719 Personal history of other diseases of the digestive system: Secondary | ICD-10-CM | POA: Diagnosis not present

## 2024-02-15 DIAGNOSIS — Z1211 Encounter for screening for malignant neoplasm of colon: Secondary | ICD-10-CM

## 2024-02-15 MED ORDER — FAMOTIDINE 20 MG PO TABS: 20.0000 mg | ORAL_TABLET | Freq: Two times a day (BID) | ORAL | 3 refills | Status: AC

## 2024-02-15 NOTE — Patient Instructions (Signed)
 If GERD increases let us  know and we will consider EGD and PPI.  Follow up in a year.  _______________________________________________________  If your blood pressure at your visit was 140/90 or greater, please contact your primary care physician to follow up on this.  _______________________________________________________  If you are age 64 or older, your body mass index should be between 23-30. Your Body mass index is 28.29 kg/m. If this is out of the aforementioned range listed, please consider follow up with your Primary Care Provider.  If you are age 24 or younger, your body mass index should be between 19-25. Your Body mass index is 28.29 kg/m. If this is out of the aformentioned range listed, please consider follow up with your Primary Care Provider.   ________________________________________________________  The [facility] providers would like to encourage you to use [facility] to communicate with providers for non-urgent requests or questions.  Due to long hold times on the telephone, sending your provider a message by [facility] may be a faster and more efficient way to get a response.  Please allow 48 business hours for a response.  Please remember that this is for non-urgent requests.  _______________________________________________________   It was a pleasure to see you today!  Thank you for trusting me with your gastrointestinal care!

## 2024-02-15 NOTE — Progress Notes (Unsigned)
 GASTROENTEROLOGY OUTPATIENT CLINIC VISIT   Primary Care Provider Dema Filler, MD [address] [place] [address] [phone]  Referring Provider Dema Filler, MD [address] [place],  [place] [address] [phone]  Patient Profile: Ethan Williams is a 63 y.o. male with a pmh significant for hyperlipidemia, migraines, nephrolithiasis, MDD, anxiety, BPH, status post appendectomy, diverticulosis (with episode of prior diverticulitis), GERD, liver cyst, prior alcohol  use disorder.  The patient presents to the [facility] for an evaluation and management of problem(s) noted below:  Problem List No diagnosis found.  Discussed the use of AI scribe software for clinical note transcription with the patient, who gave verbal consent to proceed.  History of Present Illness This is the patient's first visit to the [facility] GI clinic.  He was diagnosed with diverticulitis of the sigmoid colon in May of this year after presenting with pain in the LLQ.  At the same time he was found to have a kidney stone on the right side.  He was treated with antibiotics for his diverticulitis and within a few weeks he had improvement.  He reports no current symptoms and has had regular bowel habits since then.  In the past he has dealt with infrequent episodes of constipation which have led to hemorrhoid flare-ups and noted toilet paper blood at times but not true hematochezia.  He was supposed to undergo a colonoscopy back in 2020 but ended up deferring on this.  He reports a 2023 negative Cologuard test however done by his PCP.  He wonders as to whether he needs a colonoscopy.  The patient also reports a long-standing history of heartburn, which he has been managing with famotidine . He has been on this medication for over 20 years, initially starting with ranitidine (Zantac) which was later switched to famotidine .  He has been on twice daily Famotidine  and this controlled his symptoms  relatively well without overt breakthrough unless he did not follow his food/diet closely.  However, for some reason his PCP prescription had been decreased to once daily and now he has been out of this for the last 5-days and has really had significant worsening of his symptoms with mild dysphagia.  He hopes to get back on this soon.  He has never had an upper endoscopy to evaluate his esophagus for Barrett's screening.   GI Review of Systems Positive as above Negative for abdominal pain, melena, hematochezia, change in bowel habits  Review of Systems General: Denies fevers/chills/weight loss unintentionally Cardiovascular: Denies chest pain Pulmonary: Denies shortness of breath Gastroenterological: See HPI Genitourinary: Denies darkened urine Hematological: Denies easy bruising/bleeding Dermatological: Denies jaundice Psychological: Mood is stable   Medications Current Outpatient Medications  Medication Sig Dispense Refill   acetaminophen  (TYLENOL ) 500 MG tablet Take 1 tablet (500 mg total) by mouth every 6 (six) hours as needed. 30 tablet 0   aspirin EC 81 MG tablet Take 81 mg by mouth daily. Swallow whole.     calcium  carbonate (TUMS - DOSED IN MG ELEMENTAL CALCIUM ) 500 MG chewable tablet Chew 1 tablet by mouth daily as needed for indigestion or heartburn.     famotidine  (PEPCID ) 20 MG tablet Take 1 tablet (20 mg total) by mouth 2 (two) times daily. 180 tablet 3   polyethylene glycol powder (GLYCOLAX /MIRALAX ) 17 GM/SCOOP powder Take 17 g by mouth as needed.     rizatriptan  (MAXALT ) 10 MG tablet TAKE 1 TABLET BY MOUTH AT EARLIEST ONSET OF MIGRAINE. MAY REPEAT IN 2 HOURS IF NEEDED.  MAXIMUM 2 TABLETS IN 24 HOURS 10 tablet 5   tamsulosin  (FLOMAX ) 0.4 MG CAPS capsule Take 1 capsule (0.4 mg total) by mouth daily. 14 capsule 0   albuterol  (VENTOLIN  HFA) 108 (90 Base) MCG/ACT inhaler Inhale 2 puffs into the lungs every 4 (four) hours as needed for wheezing or shortness of breath. (Patient not  taking: Reported on 02/15/2024) 18 g 3   rosuvastatin  (CRESTOR ) 10 MG tablet Take 1 tablet (10 mg total) by mouth daily. (Patient not taking: Reported on 02/15/2024) 90 tablet 3   No current facility-administered medications for this visit.    Allergies No Known Allergies  Histories Past Medical History:  Diagnosis Date   Anginal pain (HCC)    Anxiety    Arthritis    right shoulder (06/06/2012), finger joints   Daily headache    here lately they've been daily (06/06/2012), no current problems per patient 06/16/19   Depression    Diverticulitis    Dysrhythmia    palpitations (06/06/2012)   External hemorrhoid, bleeding    GERD (gastroesophageal reflux disease)    Heart murmur    as a child (06/06/2012), no problems   Hyperlipidemia    Kidney stone    Migraines    treated for them in the last 6 months (06/06/2012), none since 2013   Pneumonia 2012; 06/06/2012   Seasonal allergies    outdoor; pollen, grass (06/06/2012)   Shortness of breath 06/06/2012   at rest; lying down; w/exertion, no current problems per patient 10/12/200   Substance abuse (HCC)    hx - clean since 2010 per patient 06/16/19   Past Surgical History:  Procedure Laterality Date   ANTERIOR CERVICAL DECOMP/DISCECTOMY FUSION N/A 10/23/2018   Procedure: ANTERIOR CERVICAL DECOMPRESSION FUSION, CERVICAL 5-6, CERVICAL 6-7 , WITH POSSIBLE C6-7 CORPECTOMY;  Surgeon: Virl Grimes, MD;  Location: [facility] OR;  Service: Orthopedics;  Laterality: N/A;   APPENDECTOMY  2001   POSTERIOR CERVICAL FUSION/FORAMINOTOMY Bilateral 10/24/2018   Procedure: CERVICAL FIVE-SEVEN POSTERIOR SPINAL FUSION WITH INSTRUMENTATION AND ALLOGRAFT;  Surgeon: Virl Grimes, MD;  Location: [facility] OR;  Service: Orthopedics;  Laterality: Bilateral;   SHOULDER SURGERY Right    WISDOM TOOTH EXTRACTION     Social History   Socioeconomic History   Marital status: Single    Spouse name: Not on file   Number of children: 0   Years of education:  Not on file   Highest education level: Not on file  Occupational History   Occupation: unemployed  Tobacco Use   Smoking status: Every Day    Current packs/day: 1.00    Average packs/day: 1 pack/day for 45.0 years (45.0 ttl pk-yrs)    Types: Cigarettes   Smokeless tobacco: Never  Vaping Use   Vaping status: Never Used  Substance and Sexual Activity   Alcohol  use: No    Comment: 06/06/2012 History of abuse ; sober since 07/2009   Drug use: No    Types: Cocaine, Other-see comments, Marijuana, LSD, Heroin    Comment: 06/06/2012 havd used everything; addicted to cocaine for a time; went to [facility]; been clean since 07/2009   Sexual activity: Yes  Other Topics Concern   Not on file  Social History Narrative   Lives with Parents   Unemployeed   Social Drivers of Health   Financial Resource Strain: Not on file  Food Insecurity: Not on file  Transportation Needs: Not on file  Physical Activity: Not on file  Stress: Not on file  Social Connections: Not on  file  Intimate Partner Violence: Not on file   Family History  Problem Relation Age of Onset   Prostate cancer Father    Colon cancer Neg Hx    Rectal cancer Neg Hx    Esophageal cancer Neg Hx    Inflammatory bowel disease Neg Hx    Liver disease Neg Hx    Pancreatic cancer Neg Hx    I have reviewed his medical, social, and family history in detail and updated the electronic medical record as necessary.    PHYSICAL EXAMINATION  BP 122/72 (BP Location: Left Arm, Patient Position: Sitting, Cuff Size: Normal)   Pulse 84   Ht 5' 10.5 (1.791 m) Comment: height measured without shoes  Wt 200 lb (90.7 kg)   BMI 28.29 kg/m  Wt Readings from Last 3 Encounters:  02/15/24 200 lb (90.7 kg)  06/01/23 202 lb (91.6 kg)  03/29/23 204 lb 9.6 oz (92.8 kg)  GEN: NAD, appears stated age, doesn't appear chronically ill PSYCH: Cooperative, without pressured speech EYE: Conjunctivae pink, sclerae anicteric ENT: MMM CV:  Nontachycardic RESP: No audible wheezing GI: NABS, soft, NT/ND, without rebound MSK/EXT: No significant lower extremity edema SKIN: No jaundice NEURO:  Alert & Oriented x 3, no focal deficits   REVIEW OF DATA  I reviewed the following data at the time of this encounter:  GI Procedures and Studies  Reported 2023 negative Cologuard  Laboratory Studies  Reviewed those in epic  Imaging Studies  May 2024 CT abdomen pelvis with contrast IMPRESSION: 1. Acute uncomplicated sigmoid diverticulitis. 2. Nonobstructing 2 mm distal right ureteral stone.   ASSESSMENT  Mr. Prichett is a 63 y.o. male with a pmh significant for hyperlipidemia, migraines, nephrolithiasis, MDD, anxiety, BPH, status post appendectomy, diverticulosis (with episode of prior diverticulitis), GERD, liver cyst, prior alcohol  use disorder.  The patient is seen today for evaluation and management of:  No diagnosis found.  The patient is hemodynamically stable.  From a GI perspective, his history of prior diverticulitis, a has healed well.  He did have a negative Cologuard in 2023.  This was his first episode of diverticulitis and it was uncomplicated.  We did discuss the consideration of a colonoscopy to just ensure that there was no evidence of a mass or lesion that may have manifested or masquerade as diverticulitis, but seeing as the patient has done well and he again did have a negative Cologuard, it is reasonable to hold on colonoscopy for now.  We will reconsider and rediscuss at follow-up in 6 months whether he continues to agree with this plan and we will work on having a follow-up Cologuard in 2026 otherwise if he continues to hold on a diagnostic colonoscopy.  Regards to his upper GI symptoms, he usually has well-controlled GERD on famotidine  but as he has been out of his medication, he now has had 5 days worth of worsening issues.  I suspect that he probably does have underlying esophagitis but without an upper endoscopic  evaluation it is not clear that we can say that.  Will restart him on his famotidine  twice daily.  We did discuss the role of a diagnostic endoscopy for screening of possible Barrett's esophagus.  He will think about this.  When we see him back in 6 months we will also discuss the consideration of an endoscopy at that point certainly if he has progressive symptoms or does not improve while being back on his H2 RA therapy, and if we need to start PPI therapy,  then he will benefit from an endoscopy.  All patient questions were answered to the best of my ability, and the patient agrees to the aforementioned plan of action with follow-up as indicated.   PLAN  Famotidine  20 mg twice daily If patient has progressive GERD symptoms or progressive dysphagia, he will let us  know and we will proceed with a diagnostic endoscopy Follow-up in 6 months to discuss symptoms and consider diagnostic colonoscopy Cologuard 2026 tentative recall - We will obtain true evaluation that he had a negative Cologuard (though he reports his last year)   No orders of the defined types were placed in this encounter.   New Prescriptions   No medications on file   Modified Medications   No medications on file    Planned Follow Up No follow-ups on file.   Total Time in Face-to-Face and in Coordination of Care for patient including independent/personal interpretation/review of prior testing, medical history, examination, medication adjustment, communicating results with the patient directly, and documentation within the EHR is 35 minutes.   Yong Henle, MD [facility] Office # [phone]

## 2024-02-16 ENCOUNTER — Encounter: Payer: Self-pay | Admitting: Gastroenterology

## 2024-02-18 ENCOUNTER — Telehealth: Payer: Self-pay

## 2024-02-18 NOTE — Telephone Encounter (Signed)
-----   Message from [facility] sent at 02/18/2024  4:01 PM EDT ----- Regarding: Patient follow-up Maya, Can you please put a clinic follow-up for 6 to 8 months from now recall? I want to discuss with him as I wrote in my note colon cancer screening type for 2026 and consideration of upper endoscopy for Barrett's screening. Thanks. GM

## 2024-02-18 NOTE — Telephone Encounter (Signed)
 OV recall placed for December

## 2024-02-25 NOTE — Progress Notes (Unsigned)
 NEUROLOGY FOLLOW UP OFFICE NOTE  Ethan Williams [id number]  Assessment/Plan:   1  Migraine with aura, without status migrainosus, not intractable/ocular migraines 2  Cervical spinal stenosis 3. Left-sided occipital neuralgia   1.Migraine prevention:  defers 3. Migraine rescue:  rizatriptan  10mg   4.Limit use of pain relievers to no more than 9 days out of the month to prevent risk of rebound or medication-overuse headache. 5.  Keep headache diary 6.  Follow up 1 year   Subjective:  Ethan Williams is a 63 year old right-handed Caucasian male with anxiety, depression who follows up for migraines.   UPDATE: Doing well. Duration:  2 days, starts to ease in 30-60 minutes with rizatriptan . Frequency:  0 to 2 times a month (usually once a month)  Current NSAIDS:  Advil  Current analgesics:  none Current triptans:  rizatriptan  10mg  Current ergotamine:  none Current anti-emetic:  none Current muscle relaxants:  none Current anti-anxiolytic:  none Current sleep aide:  none Current Antihypertensive medications:  none Current Antidepressant medications:  none Current Anticonvulsant medications: none Current anti-CGRP:  none Current Vitamins/Herbal/Supplements:  MVI Current Antihistamines/Decongestants:  none Other therapy:  none Hormone/birth control:  none   Caffeine :  Stopped coffee and smoking intake, which has helped.  Headache and neck pain improved.  Restarted and headaches/neck pain worse.   Diet:  Does not drink much water.  A little bit of soda. Exercise:  Not routine Depression:  no; Anxiety:  no Other pain: Has been dealing with low back pain radiating into left groin.  Saw his orthopedist.  Received cortisone injection which has helped. Sleep hygiene:  okay   HISTORY:  He started having migraines since 2013.  He starts with fatigue, slurred speech and visual aura with blurred vision, spots, crescent shapes, flashes and lines for 30 minutes followed by severe  stabbing occipital headache (either side but usually left-sided) radiating to the front of head lasting a 1 to 2 hours with sumatriptan , that lingers for a couple of days.  Associated with photophobia and phonophobia but not really nausea or vomiting.  Residual headache for 2 to 3 days.  Occurs every 2 weeks.  Coughing aggravates it.  Resting in dark and quiet room helps.     MRI and MRA of brain from 12/14/2011 were normal. MRI of cervical spine without contrast from 08/19/2018 showed moderately large C6-7 disc extrusion resulting in moderate spinal stenosis and moderate left neural foraminal stenosis, C5-6 disc degeneration and left parcentral protrusion resulting in moderate spinal stenosis and severe left neural foraminal stenosis, and moderate right neural foraminal stenosis at C3-4 and C4-5.  Underwent ACDF 5-6 and 6-7 in 2020.  Still has residual numbness in left arm and sometimes perioral numbness and numbness in the jaw bilateral.  Due to worsening migraines, MRI of brain with and without contrast was performed on 03/12/2021, which was unremarkable.   Past NSAIDS/steroids:  naproxen  Past analgesics:  Tylenol ; Fiorciet; tramadol  Past abortive triptans:  sumatriptan  tab, rizatriptan  Past abortive ergotamine:  none Past muscle relaxants:  baclofen  Past anti-emetic:  none Past antihypertensive medications:  none Past antidepressant medications:  none Past anticonvulsant medications:  topiramate , gabapentin  Past anti-CGRP:  Ubrelvy  Past vitamins/Herbal/Supplements:  none Other past therapies:  Coffee     Family history of headache:  no   History of concussion History of cervical spinal stenosis s/p fusion in February 2020.  PAST MEDICAL HISTORY: Past Medical History:  Diagnosis Date   Anginal pain (HCC)    Anxiety  Arthritis    right shoulder (06/06/2012), finger joints   Daily headache    here lately they've been daily (06/06/2012), no current problems per patient 06/16/19    Depression    Diverticulitis    Dysrhythmia    palpitations (06/06/2012)   External hemorrhoid, bleeding    GERD (gastroesophageal reflux disease)    Heart murmur    as a child (06/06/2012), no problems   Hyperlipidemia    Kidney stone    Migraines    treated for them in the last 6 months (06/06/2012), none since 2013   Pneumonia 2012; 06/06/2012   Seasonal allergies    outdoor; pollen, grass (06/06/2012)   Shortness of breath 06/06/2012   at rest; lying down; w/exertion, no current problems per patient 10/12/200   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 30 tablet 0   albuterol  (VENTOLIN  HFA) 108 (90 Base) MCG/ACT inhaler Inhale 2 puffs into the lungs every 4 (four) hours as needed for wheezing or shortness of breath. (Patient not taking: Reported on 02/15/2024) 18 g 3   aspirin EC 81 MG tablet Take 81 mg by mouth daily. Swallow whole.     calcium  carbonate (TUMS - DOSED IN MG ELEMENTAL CALCIUM ) 500 MG chewable tablet Chew 1 tablet by mouth daily as needed for indigestion or heartburn.     famotidine  (PEPCID ) 20 MG tablet Take 1 tablet (20 mg total) by mouth 2 (two) times daily. 180 tablet 3   polyethylene glycol powder (GLYCOLAX /MIRALAX ) 17 GM/SCOOP powder Take 17 g by mouth as needed.     rizatriptan  (MAXALT ) 10 MG tablet TAKE 1 TABLET BY MOUTH AT EARLIEST ONSET OF MIGRAINE. MAY REPEAT IN 2 HOURS IF NEEDED. MAXIMUM 2 TABLETS IN 24 HOURS 10 tablet 5   rosuvastatin  (CRESTOR ) 10 MG tablet Take 1 tablet (10 mg total) by mouth daily. (Patient not taking: Reported on 02/15/2024) 90 tablet 3   tamsulosin  (FLOMAX ) 0.4 MG CAPS capsule Take 1 capsule (0.4 mg total) by mouth daily. 14 capsule 0   No current facility-administered medications on file  prior to visit.    ALLERGIES: No Known Allergies  FAMILY HISTORY: Family History  Problem Relation Age of Onset   Prostate cancer Father    Colon cancer Neg Hx    Rectal cancer Neg Hx    Esophageal cancer Neg Hx    Inflammatory bowel disease Neg Hx    Liver disease Neg Hx    Pancreatic cancer Neg Hx       Objective:  Blood pressure 137/76, pulse 77, height 5' 10 (1.778 m), weight 201 lb (91.2 kg), SpO2 97%. General: No acute distress.  Patient appears well-groomed.   Head:  Normocephalic/atraumatic Neck:  Supple.  No paraspinal tenderness.  Full range of motion. Heart:  Regular rate and rhythm. Neuro:  Alert and oriented.  Speech fluent and not dysarthric.  Language intact.  CN II-XII intact.  Bulk and tone normal.  Muscle strength 5/5 throughout.  Sensation to light touch intact.  Deep tendon reflexes 2+ throughout, toes downgoing.  Gait normal.  Romberg negative.    Juliene Dunnings, DO  CC: Elna Redo, MD

## 2024-02-26 ENCOUNTER — Ambulatory Visit: Payer: MEDICAID | Admitting: Neurology

## 2024-02-26 ENCOUNTER — Encounter: Payer: Self-pay | Admitting: Neurology

## 2024-02-26 VITALS — BP 137/76 | HR 77 | Ht 70.0 in | Wt 201.0 lb

## 2024-02-26 DIAGNOSIS — G43101 Migraine with aura, not intractable, with status migrainosus: Secondary | ICD-10-CM

## 2024-02-26 MED ORDER — RIZATRIPTAN BENZOATE 10 MG PO TABS: ORAL_TABLET | ORAL | 5 refills | Status: DC

## 2024-02-26 NOTE — Patient Instructions (Signed)
 Rizatriptan  as needed.  Limit use of pain relievers to no more than 9 days out of the month to prevent risk of rebound or medication-overuse headache. Keep headache diary Follow up one year

## 2024-05-05 ENCOUNTER — Telehealth (HOSPITAL_COMMUNITY): Payer: Self-pay | Admitting: Emergency Medicine

## 2024-05-05 ENCOUNTER — Emergency Department (HOSPITAL_COMMUNITY): Payer: MEDICAID

## 2024-05-05 ENCOUNTER — Encounter (HOSPITAL_COMMUNITY): Payer: Self-pay

## 2024-05-05 ENCOUNTER — Emergency Department (HOSPITAL_COMMUNITY)
Admission: EM | Admit: 2024-05-05 | Discharge: 2024-05-05 | Disposition: A | Discharge status: Home / Self Care | Payer: MEDICAID | Attending: Emergency Medicine | Admitting: Emergency Medicine

## 2024-05-05 DIAGNOSIS — R509 Fever, unspecified: Secondary | ICD-10-CM | POA: Diagnosis present

## 2024-05-05 DIAGNOSIS — F17210 Nicotine dependence, cigarettes, uncomplicated: Secondary | ICD-10-CM | POA: Diagnosis not present

## 2024-05-05 DIAGNOSIS — U071 COVID-19: Secondary | ICD-10-CM | POA: Insufficient documentation

## 2024-05-05 LAB — RESP PANEL BY RT-PCR (RSV, FLU A&B, COVID)  RVPGX2
Influenza A by PCR: NEGATIVE
Influenza B by PCR: NEGATIVE
Resp Syncytial Virus by PCR: NEGATIVE
SARS Coronavirus 2 by RT PCR: POSITIVE — AB

## 2024-05-05 MED ORDER — METHOCARBAMOL 500 MG PO TABS
500.0000 mg | ORAL_TABLET | Freq: Three times a day (TID) | ORAL | 0 refills | Status: AC | PRN
Start: 2024-05-05 — End: ?

## 2024-05-05 MED ORDER — NAPROXEN 500 MG PO TABS: 500.0000 mg | ORAL_TABLET | Freq: Two times a day (BID) | ORAL | 0 refills | Status: DC

## 2024-05-05 MED ORDER — NAPROXEN 500 MG PO TABS: 500.0000 mg | ORAL_TABLET | Freq: Two times a day (BID) | ORAL | 0 refills | Status: AC

## 2024-05-05 MED ORDER — ACETAMINOPHEN 500 MG PO TABS
1000.0000 mg | ORAL_TABLET | Freq: Once | ORAL | Status: AC
  Administered 2024-05-05: 1000 mg via ORAL
  Filled 2024-05-05: qty 2

## 2024-05-05 MED ORDER — NAPROXEN 500 MG PO TABS
500.0000 mg | ORAL_TABLET | Freq: Once | ORAL | Status: AC
  Administered 2024-05-05: 500 mg via ORAL
  Filled 2024-05-05: qty 1

## 2024-05-05 MED ORDER — METHOCARBAMOL 500 MG PO TABS: 500.0000 mg | ORAL_TABLET | Freq: Three times a day (TID) | ORAL | 0 refills | Status: DC | PRN

## 2024-05-05 NOTE — Discharge Instructions (Signed)
 You were evaluated in the Emergency Department and after careful evaluation, we did not find any emergent condition requiring admission or further testing in the hospital.  Your exam/testing today is overall reassuring.  Symptoms likely due to COVID-19.  Continue Tylenol  1000 mg every 4-6 hours for fever or pain.  Also recommend use of the Naprosyn  twice daily for pain.  Use the Robaxin  for muscle aches.  Plenty of fluids and rest.  Please return to the Emergency Department if you experience any worsening of your condition.   Thank you for allowing us  to be a part of your care.

## 2024-05-05 NOTE — ED Provider Notes (Signed)
 [facility] DEPT [facility] Emergency Department Provider Note MRN:  [id number]  Arrival date & time: 05/05/24     Chief Complaint   Fever History of Present Illness   Ethan Williams is a 63 y.o. year-old male with no pertinent past medical history presenting to the ED with chief complaint of fever.  Fever up to 104, cough, malaise, fatigue, body aches.  No nausea vomiting or diarrhea, no abdominal pain, no rash.  Review of Systems  A thorough review of systems was obtained and all systems are negative except as noted in the HPI and PMH.   Patient's Health History    Past Medical History:  Diagnosis Date   Anginal pain (HCC)    Anxiety    Arthritis    right shoulder (06/06/2012), finger joints   Daily headache    here lately they've been daily (06/06/2012), no current problems per patient 06/16/19   Depression    Diverticulitis    Dysrhythmia    palpitations (06/06/2012)   External hemorrhoid, bleeding    GERD (gastroesophageal reflux disease)    Heart murmur    as a child (06/06/2012), no problems   Hyperlipidemia    Kidney stone    Migraines    treated for them in the last 6 months (06/06/2012), none since 2013   Pneumonia 2012; 06/06/2012   Seasonal allergies    outdoor; pollen, grass (06/06/2012)   Shortness of breath 06/06/2012   at rest; lying down; w/exertion, no current problems per patient 10/12/200   Substance abuse (HCC)    hx - clean since 2010 per patient 06/16/19    Past Surgical History:  Procedure Laterality Date   ANTERIOR CERVICAL DECOMP/DISCECTOMY FUSION N/A 10/23/2018   Procedure: ANTERIOR CERVICAL DECOMPRESSION FUSION, CERVICAL 5-6, CERVICAL 6-7 , WITH POSSIBLE C6-7 CORPECTOMY;  Surgeon: Beuford Anes, MD;  Location: [facility] OR;  Service: Orthopedics;  Laterality: N/A;   APPENDECTOMY  2001   POSTERIOR CERVICAL FUSION/FORAMINOTOMY Bilateral 10/24/2018   Procedure: CERVICAL FIVE-SEVEN POSTERIOR SPINAL FUSION WITH INSTRUMENTATION AND  ALLOGRAFT;  Surgeon: Beuford Anes, MD;  Location: [facility] OR;  Service: Orthopedics;  Laterality: Bilateral;   SHOULDER SURGERY Right    WISDOM TOOTH EXTRACTION      Family History  Problem Relation Age of Onset   Prostate cancer Father    Colon cancer Neg Hx    Rectal cancer Neg Hx    Esophageal cancer Neg Hx    Inflammatory bowel disease Neg Hx    Liver disease Neg Hx    Pancreatic cancer Neg Hx     Social History   Socioeconomic History   Marital status: Single    Spouse name: Not on file   Number of children: 0   Years of education: Not on file   Highest education level: Not on file  Occupational History   Occupation: unemployed  Tobacco Use   Smoking status: Every Day    Current packs/day: 1.00    Average packs/day: 1 pack/day for 45.0 years (45.0 ttl pk-yrs)    Types: Cigarettes   Smokeless tobacco: Never  Vaping Use   Vaping status: Never Used  Substance and Sexual Activity   Alcohol  use: No    Comment: 06/06/2012 History of abuse ; sober since 07/2009   Drug use: No    Types: Cocaine, Other-see comments, Marijuana, LSD, Heroin    Comment: 06/06/2012 havd used everything; addicted to cocaine for a time; went to [facility]; been clean since 07/2009   Sexual activity: Yes  Other  Topics Concern   Not on file  Social History Narrative   Lives with Parents   Unemployeed   Social Drivers of Health   Financial Resource Strain: Not on file  Food Insecurity: Not on file  Transportation Needs: Not on file  Physical Activity: Not on file  Stress: Not on file  Social Connections: Not on file  Intimate Partner Violence: Not on file     Physical Exam   Vitals:   05/05/24 0450 05/05/24 0600  BP: (!) 160/90 132/83  Pulse: (!) 101 (!) 117  Resp: 18 17  Temp: (!) 103.4 F (39.7 C)   SpO2: 95% 95%    CONSTITUTIONAL: Well-appearing, NAD NEURO/PSYCH:  Alert and oriented x 3, no focal deficits EYES:  eyes equal and reactive ENT/NECK:  no LAD, no JVD CARDIO: Regular  rate, well-perfused, normal S1 and S2 PULM:  CTAB no wheezing or rhonchi GI/GU:  non-distended, non-tender MSK/SPINE:  No gross deformities, no edema SKIN:  no rash, atraumatic   *Additional and/or pertinent findings included in MDM below  Diagnostic and Interventional Summary    EKG Interpretation Date/Time:  Monday May 05 2024 05:24:43 EDT Ventricular Rate:  103 PR Interval:  180 QRS Duration:  93 QT Interval:  320 QTC Calculation: 419 R Axis:   62  Text Interpretation: Sinus tachycardia Probable left atrial enlargement RSR' in V1 or V2, probably normal variant Confirmed by Theadore Sharper [phone]) on 05/05/2024 6:27:48 AM       Labs Reviewed  RESP PANEL BY RT-PCR (RSV, FLU A&B, COVID)  RVPGX2 - Abnormal; Notable for the following components:      Result Value   SARS Coronavirus 2 by RT PCR POSITIVE (*)    All other components within normal limits    DG Chest Portable 1 View  Final Result      Medications  acetaminophen  (TYLENOL ) tablet 1,000 mg (1,000 mg Oral Given 05/05/24 0517)  naproxen  (NAPROSYN ) tablet 500 mg (500 mg Oral Given 05/05/24 0517)     Procedures  /  Critical Care Procedures  ED Course and Medical Decision Making  Initial Impression and Ddx Favoring viral illness with fever such as COVID-19.  Pneumonia also considered.  Past medical/surgical history that increases complexity of ED encounter: None  Interpretation of Diagnostics I personally reviewed the Chest Xray and my interpretation is as follows: No lobar opacity or pneumothorax  Positive for COVID  Patient Reassessment and Ultimate Disposition/Management     Patient continues to look well on reassessment.  No increased work of breathing, lungs largely clear.  Minimal tachycardia in the setting of fever.  Seems explained by COVID-19.  No hypoxia.  Appropriate for discharge with return precautions.  Patient management required discussion with the following services or consulting groups:   None  Complexity of Problems Addressed Acute illness or injury that poses threat of life of bodily function  Additional Data Reviewed and Analyzed Further history obtained from: None  Additional Factors Impacting ED Encounter Risk Prescriptions  Sharper HERO. Theadore, MD [facility] Emergency Medicine [facility] [email or web address]  Final Clinical Impressions(s) / ED Diagnoses     ICD-10-CM   1. COVID-19  U07.1       ED Discharge Orders          Ordered    naproxen  (NAPROSYN ) 500 MG tablet  2 times daily        05/05/24 0626    methocarbamol  (ROBAXIN ) 500 MG tablet  Every 8 hours PRN  05/05/24 9373             Discharge Instructions Discussed with and Provided to Patient:     Discharge Instructions      You were evaluated in the Emergency Department and after careful evaluation, we did not find any emergent condition requiring admission or further testing in the hospital.  Your exam/testing today is overall reassuring.  Symptoms likely due to COVID-19.  Continue Tylenol  1000 mg every 4-6 hours for fever or pain.  Also recommend use of the Naprosyn  twice daily for pain.  Use the Robaxin  for muscle aches.  Plenty of fluids and rest.  Please return to the Emergency Department if you experience any worsening of your condition.   Thank you for allowing us  to be a part of your care.       Theadore Ozell HERO, MD 05/05/24 [phone]

## 2024-05-05 NOTE — Telephone Encounter (Cosign Needed)
 Pt called in requesting medications from last night's ER visit be sent to different pharmacy due to other pharmacy being closed on holiday. Will attempt to discontinue and send prescriptions for robaxin  and naproxen  to [facility].

## 2024-05-05 NOTE — ED Triage Notes (Signed)
 Coming from home by [facility], complaints of productive cough, flu like symptoms, body aches, complaints of urination symptoms and  fever, which 104F was the highest at home.  Pt alert and oriented in triage.

## 2024-05-12 ENCOUNTER — Other Ambulatory Visit: Payer: Self-pay

## 2024-05-12 ENCOUNTER — Emergency Department (HOSPITAL_COMMUNITY): Payer: MEDICAID

## 2024-05-12 ENCOUNTER — Encounter (HOSPITAL_COMMUNITY): Payer: Self-pay

## 2024-05-12 ENCOUNTER — Emergency Department (HOSPITAL_COMMUNITY)
Admission: EM | Admit: 2024-05-12 | Discharge: 2024-05-12 | Disposition: A | Discharge status: Home / Self Care | Payer: MEDICAID | Attending: Emergency Medicine | Admitting: Emergency Medicine

## 2024-05-12 DIAGNOSIS — Z7982 Long term (current) use of aspirin: Secondary | ICD-10-CM | POA: Insufficient documentation

## 2024-05-12 DIAGNOSIS — G43001 Migraine without aura, not intractable, with status migrainosus: Secondary | ICD-10-CM

## 2024-05-12 DIAGNOSIS — G43909 Migraine, unspecified, not intractable, without status migrainosus: Secondary | ICD-10-CM | POA: Insufficient documentation

## 2024-05-12 LAB — BASIC METABOLIC PANEL WITH GFR
Anion gap: 12 (ref 5–15)
BUN: 12 mg/dL (ref 8–23)
CO2: 23 mmol/L (ref 22–32)
Calcium: 8.9 mg/dL (ref 8.9–10.3)
Chloride: 103 mmol/L (ref 98–111)
Creatinine, Ser: 0.93 mg/dL (ref 0.61–1.24)
GFR, Estimated: >60 mL/min (ref >60)
Glucose, Bld: 79 mg/dL (ref 70–99)
Potassium: 3.9 mmol/L (ref 3.5–5.1)
Sodium: 138 mmol/L (ref 135–145)

## 2024-05-12 LAB — CBC WITH DIFFERENTIAL/PLATELET
Abs Immature Granulocytes: 0.03 K/uL (ref 0.00–0.07)
Basophils Absolute: 0 K/uL (ref 0.0–0.1)
Basophils Relative: 1 %
Eosinophils Absolute: 0.1 K/uL (ref 0.0–0.5)
Eosinophils Relative: 1 %
HCT: 44.4 % (ref 39.0–52.0)
Hemoglobin: 14.9 g/dL (ref 13.0–17.0)
Immature Granulocytes: 0 %
Lymphocytes Relative: 30 %
Lymphs Abs: 2.3 K/uL (ref 0.7–4.0)
MCH: 28.7 pg (ref 26.0–34.0)
MCHC: 33.6 g/dL (ref 30.0–36.0)
MCV: 85.4 fL (ref 80.0–100.0)
Monocytes Absolute: 0.9 K/uL (ref 0.1–1.0)
Monocytes Relative: 12 %
Neutro Abs: 4.3 K/uL (ref 1.7–7.7)
Neutrophils Relative %: 56 %
Platelets: 181 K/uL (ref 150–400)
RBC: 5.2 MIL/uL (ref 4.22–5.81)
RDW: 13.1 % (ref 11.5–15.5)
WBC: 7.6 K/uL (ref 4.0–10.5)
nRBC: 0 % (ref 0.0–0.2)

## 2024-05-12 LAB — MAGNESIUM: Magnesium: 1.7 mg/dL (ref 1.7–2.4)

## 2024-05-12 MED ORDER — HYDROCODONE-ACETAMINOPHEN 5-325 MG PO TABS: ORAL_TABLET | ORAL | 0 refills | Status: DC

## 2024-05-12 MED ORDER — RIZATRIPTAN BENZOATE 10 MG PO TABS
10.0000 mg | ORAL_TABLET | ORAL | 0 refills | Status: AC | PRN
Start: 2024-05-12 — End: ?

## 2024-05-12 MED ORDER — HYDROMORPHONE HCL 1 MG/ML IJ SOLN
0.5000 mg | Freq: Once | INTRAMUSCULAR | Status: AC
  Administered 2024-05-12: 0.5 mg via INTRAVENOUS
  Filled 2024-05-12: qty 1

## 2024-05-12 MED ORDER — DIPHENHYDRAMINE HCL 50 MG/ML IJ SOLN
25.0000 mg | Freq: Once | INTRAMUSCULAR | Status: AC
  Administered 2024-05-12: 25 mg via INTRAVENOUS
  Filled 2024-05-12: qty 1

## 2024-05-12 MED ORDER — LACTATED RINGERS IV BOLUS
1000.0000 mL | Freq: Once | INTRAVENOUS | Status: AC
  Administered 2024-05-12: 1000 mL via INTRAVENOUS

## 2024-05-12 MED ORDER — METOCLOPRAMIDE HCL 5 MG/ML IJ SOLN
10.0000 mg | Freq: Once | INTRAMUSCULAR | Status: AC
  Administered 2024-05-12: 10 mg via INTRAVENOUS
  Filled 2024-05-12: qty 2

## 2024-05-12 MED ORDER — DEXAMETHASONE SODIUM PHOSPHATE 10 MG/ML IJ SOLN
6.0000 mg | Freq: Once | INTRAMUSCULAR | Status: AC
  Administered 2024-05-12: 6 mg via INTRAVENOUS
  Filled 2024-05-12: qty 1

## 2024-05-12 MED ORDER — KETOROLAC TROMETHAMINE 30 MG/ML IJ SOLN
30.0000 mg | Freq: Once | INTRAMUSCULAR | Status: AC
  Administered 2024-05-12: 30 mg via INTRAVENOUS
  Filled 2024-05-12: qty 1

## 2024-05-12 NOTE — ED Provider Notes (Signed)
 [place] EMERGENCY DEPARTMENT AT [facility] Provider Note   CSN: [id number] Arrival date & time: 05/12/24  1139     Patient presents with: Migraine   Ethan Williams is a 63 y.o. male.  {Add pertinent medical, surgical, social history, OB history to YEP:67052} Patient has a history of migraine headaches.  He states that he has been taking his Maxalt  without help.  Patient complains of mild nausea.  He has been having a headache for few days  The history is provided by the patient and medical records. No language interpreter was used.  Migraine This is a recurrent problem. The current episode started more than 2 days ago. The problem occurs constantly. The problem has not changed since onset.Associated symptoms include headaches. Pertinent negatives include no chest pain and no abdominal pain. Nothing aggravates the symptoms. Nothing relieves the symptoms. He has tried nothing for the symptoms.       Prior to Admission medications   Medication Sig Start Date End Date Taking? Authorizing Provider  acetaminophen  (TYLENOL ) 500 MG tablet Take 1 tablet (500 mg total) by mouth every 6 (six) hours as needed. 12/17/22   Vicky Charleston, PA-C  aspirin EC 81 MG tablet Take 81 mg by mouth daily. Swallow whole.    [provider]  calcium  carbonate (TUMS - DOSED IN MG ELEMENTAL CALCIUM ) 500 MG chewable tablet Chew 1 tablet by mouth daily as needed for indigestion or heartburn.    [provider]  famotidine  (PEPCID ) 20 MG tablet Take 1 tablet (20 mg total) by mouth 2 (two) times daily. 02/15/24   Mansouraty, Aloha Raddle., MD  methocarbamol  (ROBAXIN ) 500 MG tablet Take 1 tablet (500 mg total) by mouth every 8 (eight) hours as needed for muscle spasms. 05/05/24   Roemhildt, Lorin T, PA-C  naproxen  (NAPROSYN ) 500 MG tablet Take 1 tablet (500 mg total) by mouth 2 (two) times daily. 05/05/24   Roemhildt, Lorin T, PA-C  polyethylene glycol powder (GLYCOLAX /MIRALAX ) 17 GM/SCOOP  powder Take 17 g by mouth as needed.    [provider]  rizatriptan  (MAXALT ) 10 MG tablet TAKE 1 TABLET BY MOUTH AT EARLIEST ONSET OF MIGRAINE. MAY REPEAT IN 2 HOURS IF NEEDED. MAXIMUM 2 TABLETS IN 24 HOURS 02/26/24   Skeet, Adam R, DO  tamsulosin  (FLOMAX ) 0.4 MG CAPS capsule Take 1 capsule (0.4 mg total) by mouth daily. 01/10/23   Aberman, Caroline C, PA-C    Allergies: Patient has no known allergies.    Review of Systems  Constitutional:  Negative for appetite change and fatigue.  HENT:  Negative for congestion, ear discharge and sinus pressure.   Eyes:  Negative for discharge.  Respiratory:  Negative for cough.   Cardiovascular:  Negative for chest pain.  Gastrointestinal:  Negative for abdominal pain and diarrhea.  Genitourinary:  Negative for frequency and hematuria.  Musculoskeletal:  Negative for back pain.  Skin:  Negative for rash.  Neurological:  Positive for headaches. Negative for seizures.  Psychiatric/Behavioral:  Negative for hallucinations.     Updated Vital Signs BP (!) 166/112 (BP Location: Left Arm)   Pulse 64   Temp 97.8 F (36.6 C) (Oral)   Resp 18   SpO2 96%   Physical Exam Vitals and nursing note reviewed.  Constitutional:      Appearance: He is well-developed.  HENT:     Head: Normocephalic.     Nose: Nose normal.  Eyes:     General: No scleral icterus.    Conjunctiva/sclera: Conjunctivae  normal.  Neck:     Thyroid: No thyromegaly.  Cardiovascular:     Rate and Rhythm: Normal rate and regular rhythm.     Heart sounds: No murmur heard.    No friction rub. No gallop.  Pulmonary:     Breath sounds: No stridor. No wheezing or rales.  Chest:     Chest wall: No tenderness.  Abdominal:     General: There is no distension.     Tenderness: There is no abdominal tenderness. There is no rebound.  Musculoskeletal:        General: Normal range of motion.     Cervical back: Neck supple.  Lymphadenopathy:     Cervical: No cervical adenopathy.   Skin:    Findings: No erythema or rash.  Neurological:     Mental Status: He is alert and oriented to person, place, and time.     Motor: No abnormal muscle tone.     Coordination: Coordination normal.  Psychiatric:        Behavior: Behavior normal.     (all labs ordered are listed, but only abnormal results are displayed) Labs Reviewed - No data to display  EKG: None  Radiology: No results found.  {Document cardiac monitor, telemetry assessment procedure when appropriate:32947} Procedures   Medications Ordered in the ED  metoCLOPramide  (REGLAN ) injection 10 mg (has no administration in time range)  ketorolac  (TORADOL ) 30 MG/ML injection 30 mg (has no administration in time range)  diphenhydrAMINE  (BENADRYL ) injection 25 mg (has no administration in time range)   Patient getting a migraine cocktail for his headache   {Click here for ABCD2, HEART and other calculators REFRESH Note before signing:1}                              Medical Decision Making Risk Prescription drug management.   Exacerbation of his migraine headaches  {Document critical care time when appropriate  Document review of labs and clinical decision tools ie CHADS2VASC2, etc  Document your independent review of radiology images and any outside records  Document your discussion with family members, caretakers and with consultants  Document social determinants of health affecting pt's care  Document your decision making why or why not admission, treatments were needed:32947:::1}   Final diagnoses:  None    ED Discharge Orders     None

## 2024-05-12 NOTE — ED Notes (Signed)
 Pt found in waiting room wanting to leave AMA. AMA explained to pt, pt walked back to room

## 2024-05-12 NOTE — Discharge Instructions (Addendum)
 Your test results today were reassuring.  Continue your home medication as needed.  Take over-the-counter ibuprofen  and Tylenol  as needed as well.  Follow-up with your neurologist.  Return to the emergency department for any new or worsening symptoms of concern.

## 2024-05-12 NOTE — ED Triage Notes (Signed)
 Pt c/o headaches for the past two to three weeks. States he has to double his headache medication. Pt also was recently diagnosed with covid last Monday and still having sob, congestion and cough.

## 2024-05-12 NOTE — ED Provider Notes (Signed)
 Care of patient assumed from Dr. Zammit.  Patient presented for ongoing headache for the past several weeks.  He has a history of migraines.  Migraine cocktail has been ordered.  Will require reassessment. Physical Exam  BP (!) 161/99   Pulse 67   Temp 98 F (36.7 C) (Oral)   Resp 18   SpO2 94%   Physical Exam Vitals and nursing note reviewed.  Constitutional:      General: He is not in acute distress.    Appearance: He is well-developed. He is not ill-appearing, toxic-appearing or diaphoretic.  HENT:     Head: Normocephalic and atraumatic.     Right Ear: External ear normal.     Left Ear: External ear normal.     Nose: Nose normal.     Mouth/Throat:     Mouth: Mucous membranes are moist.  Eyes:     Extraocular Movements: Extraocular movements intact.     Conjunctiva/sclera: Conjunctivae normal.  Cardiovascular:     Rate and Rhythm: Normal rate and regular rhythm.  Pulmonary:     Effort: Pulmonary effort is normal. No respiratory distress.  Abdominal:     General: There is no distension.     Palpations: Abdomen is soft.     Tenderness: There is no abdominal tenderness.  Musculoskeletal:        General: Normal range of motion.     Cervical back: Normal range of motion and neck supple.  Skin:    General: Skin is warm and dry.     Coloration: Skin is not jaundiced or pale.  Neurological:     General: No focal deficit present.     Mental Status: He is alert and oriented to person, place, and time.  Psychiatric:        Mood and Affect: Mood normal.        Behavior: Behavior normal.     Procedures  Procedures  ED Course / MDM    Medical Decision Making Amount and/or Complexity of Data Reviewed Labs: ordered. Radiology: ordered.  Risk Prescription drug management.   On assessment, patient appears uncomfortable.  He states that he is followed by neurology for long-term management of his headaches.  He takes rizatriptan  as needed for migraines.  Over the past  several weeks, he has doubled his dose of triptan for treatment of his headache.  Although this does provide relief, headache returns after 12 hours.  Patient describes current severe left-sided headache.  Per chart review, last head imaging was 3 years ago.  Will obtain CT head today.  Dilaudid  was ordered for ongoing analgesia.  IV fluids ordered for hydration.  Decadron  ordered to minimize headache recurrence.  Lab work and CT of head were unremarkable.  Patient's headache did improve while in the ED.  He was advised to follow-up with his neurologist.  He was discharged in stable condition.       Melvenia Motto, MD 05/12/24 2020

## 2024-05-13 NOTE — Progress Notes (Unsigned)
 NEUROLOGY FOLLOW UP OFFICE NOTE  Ethan Williams [id number]  Assessment/Plan:   1  Migraine with aura, without status migrainosus, not intractable/ocular migraines 2  Cervical spinal stenosis 3. Left-sided occipital neuralgia   1.Migraine prevention:  *** 3. Migraine rescue:  rizatriptan  10mg   4.Limit use of pain relievers to no more than 9 days out of the month to prevent risk of rebound or medication-overuse headache. 5.  Keep headache diary 6.  Follow up 1 year   Subjective:  Ethan Williams is a 63 year old right-handed Caucasian male with anxiety, depression who follows up for worsening migraines.  History supplemented by ED notes.   UPDATE: He began having increased in migraines ***.  He was diagnosed with COVID last week, but increased headaches have been ongoing for much longer than that.  Seen in the ED on Monday.  CT head unremarkable and treated with headache cocktail.    Current NSAIDS:  naproxen  500mg   Current analgesics:  none Current triptans:  rizatriptan  10mg  Current ergotamine:  none Current anti-emetic:  none Current muscle relaxants:  methocarbamol  500mg  PRN Current anti-anxiolytic:  none Current sleep aide:  none Current Antihypertensive medications:  none Current Antidepressant medications:  none Current Anticonvulsant medications: none Current anti-CGRP:  none Current Vitamins/Herbal/Supplements:  MVI Current Antihistamines/Decongestants:  none Other therapy:  none Hormone/birth control:  none   Caffeine :  Stopped coffee and smoking intake, which has helped.  Headache and neck pain improved.  Restarted and headaches/neck pain worse.   Diet:  Does not drink much water.  A little bit of soda. Exercise:  Not routine Depression:  no; Anxiety:  no Other pain: Has been dealing with low back pain radiating into left groin.  Saw his orthopedist.  Received cortisone injection which has helped. Sleep hygiene:  okay   HISTORY:  He started having  migraines since 2013.  He starts with fatigue, slurred speech and visual aura with blurred vision, spots, crescent shapes, flashes and lines for 30 minutes followed by severe stabbing occipital headache (either side but usually left-sided) radiating to the front of head lasting a 1 to 2 hours with sumatriptan , that lingers for a couple of days.  Associated with photophobia and phonophobia but not really nausea or vomiting.  Residual headache for 2 to 3 days.  Occurs every 2 weeks.  Coughing aggravates it.  Resting in dark and quiet room helps.     MRI and MRA of brain from 12/14/2011 were normal. MRI of cervical spine without contrast from 08/19/2018 showed moderately large C6-7 disc extrusion resulting in moderate spinal stenosis and moderate left neural foraminal stenosis, C5-6 disc degeneration and left parcentral protrusion resulting in moderate spinal stenosis and severe left neural foraminal stenosis, and moderate right neural foraminal stenosis at C3-4 and C4-5.  Underwent ACDF 5-6 and 6-7 in 2020.  Still has residual numbness in left arm and sometimes perioral numbness and numbness in the jaw bilateral.  Due to worsening migraines, MRI of brain with and without contrast was performed on 03/12/2021, which was unremarkable.   Past NSAIDS/steroids:  naproxen  Past analgesics:  Tylenol ; Fiorciet; tramadol  Past abortive triptans:  sumatriptan  tab, rizatriptan  Past abortive ergotamine:  none Past muscle relaxants:  baclofen  Past anti-emetic:  none Past antihypertensive medications:  none Past antidepressant medications:  none Past anticonvulsant medications:  topiramate , gabapentin  Past anti-CGRP:  Ubrelvy  Past vitamins/Herbal/Supplements:  none Other past therapies:  Coffee     Family history of headache:  no   History of concussion History of  cervical spinal stenosis s/p fusion in February 2020.  PAST MEDICAL HISTORY: Past Medical History:  Diagnosis Date   Anginal pain (HCC)    Anxiety     Arthritis    right shoulder (06/06/2012), finger joints   Daily headache    here lately they've been daily (06/06/2012), no current problems per patient 06/16/19   Depression    Diverticulitis    Dysrhythmia    palpitations (06/06/2012)   External hemorrhoid, bleeding    GERD (gastroesophageal reflux disease)    Heart murmur    as a child (06/06/2012), no problems   Hyperlipidemia    Kidney stone    Migraines    treated for them in the last 6 months (06/06/2012), none since 2013   Pneumonia 2012; 06/06/2012   Seasonal allergies    outdoor; pollen, grass (06/06/2012)   Shortness of breath 06/06/2012   at rest; lying down; w/exertion, no current problems per patient 10/12/200   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 30 tablet 0   aspirin EC 81 MG tablet Take 81 mg by mouth daily. Swallow whole.     calcium  carbonate (TUMS - DOSED IN MG ELEMENTAL CALCIUM ) 500 MG chewable tablet Chew 1 tablet by mouth daily as needed for indigestion or heartburn.     famotidine  (PEPCID ) 20 MG tablet Take 1 tablet (20 mg total) by mouth 2 (two) times daily. 180 tablet 3   HYDROcodone -acetaminophen  (NORCO/VICODIN) 5-325 MG tablet Take 1 for headache if your Maxalt  does not work 10 tablet 0   methocarbamol  (ROBAXIN ) 500 MG tablet Take 1 tablet (500 mg total) by mouth every 8 (eight) hours as needed for muscle spasms. 30 tablet 0   naproxen  (NAPROSYN ) 500 MG tablet Take 1 tablet (500 mg total) by mouth 2 (two) times daily. 30 tablet 0   polyethylene glycol powder (GLYCOLAX /MIRALAX ) 17 GM/SCOOP powder Take 17 g by mouth as needed.     rizatriptan  (MAXALT ) 10 MG tablet TAKE 1 TABLET BY MOUTH AT EARLIEST ONSET OF MIGRAINE. MAY REPEAT IN 2 HOURS IF NEEDED.  MAXIMUM 2 TABLETS IN 24 HOURS 10 tablet 5   rizatriptan  (MAXALT ) 10 MG tablet Take 1 tablet (10 mg total) by mouth as needed for migraine. May repeat in 2 hours if needed 10 tablet 0   tamsulosin  (FLOMAX ) 0.4 MG CAPS capsule Take 1 capsule (0.4 mg total) by mouth daily. 14 capsule 0   No current facility-administered medications on file prior to visit.    ALLERGIES: No Known Allergies  FAMILY HISTORY: Family History  Problem Relation Age of Onset   Prostate cancer Father    Colon cancer Neg Hx    Rectal cancer Neg Hx    Esophageal cancer Neg Hx    Inflammatory bowel disease Neg Hx    Liver disease Neg Hx    Pancreatic cancer Neg Hx       Objective:  *** General: No acute distress.  Patient appears well-groomed.   Head:  Normocephalic/atraumatic Neck:  Supple.  No paraspinal tenderness.  Full range of motion. Heart:  Regular rate and rhythm. Neuro:  Alert and oriented.  Speech fluent and not dysarthric.  Language intact.  CN II-XII intact.  Bulk and tone normal.  Muscle  strength 5/5 throughout.  Sensation to light touch intact.  Deep tendon reflexes 2+ throughout, toes downgoing.  Gait normal.  Romberg negative.     Juliene Dunnings, DO  CC: Elna Redo, MD

## 2024-05-14 ENCOUNTER — Encounter: Payer: Self-pay | Admitting: Neurology

## 2024-05-14 ENCOUNTER — Ambulatory Visit: Payer: MEDICAID | Admitting: Neurology

## 2024-05-14 VITALS — BP 129/83 | HR 64 | Ht 71.0 in | Wt 199.0 lb

## 2024-05-14 DIAGNOSIS — G43001 Migraine without aura, not intractable, with status migrainosus: Secondary | ICD-10-CM | POA: Diagnosis not present

## 2024-05-14 DIAGNOSIS — M4802 Spinal stenosis, cervical region: Secondary | ICD-10-CM

## 2024-05-14 MED ORDER — ONDANSETRON 4 MG PO TBDP
4.0000 mg | ORAL_TABLET | Freq: Three times a day (TID) | ORAL | 5 refills | Status: AC | PRN
Start: 2024-05-14 — End: ?

## 2024-05-14 MED ORDER — PREDNISONE 10 MG PO TABS: ORAL_TABLET | ORAL | 0 refills | Status: DC

## 2024-05-14 MED ORDER — ELETRIPTAN HYDROBROMIDE 40 MG PO TABS: 40.0000 mg | ORAL_TABLET | ORAL | 5 refills | Status: DC | PRN

## 2024-05-14 MED ORDER — NORTRIPTYLINE HCL 10 MG PO CAPS
10.0000 mg | ORAL_CAPSULE | Freq: Every day | ORAL | 5 refills | Status: AC
Start: 2024-05-14 — End: ?

## 2024-05-14 NOTE — Patient Instructions (Signed)
 Start prednisone  taper as prescribed.   Start nortriptyline  10mg  at bedtime.  We can increase dose in 4 weeks if needed. Stop rizatriptan  and hydrocodone .  At earliest onset of headache, take eletriptan  - may repeat after 2 hours.  Maximum 2 tablets in 24 hours. Ondansetron  for nausea Limit use of pain relievers to no more than 9 days out of the month to prevent risk of rebound or medication-overuse headache. Keep headache diary Follow up 3 months.

## 2024-05-21 ENCOUNTER — Telehealth: Payer: Self-pay | Admitting: Pharmacy Technician

## 2024-05-21 ENCOUNTER — Other Ambulatory Visit (HOSPITAL_COMMUNITY): Payer: Self-pay

## 2024-05-21 NOTE — Telephone Encounter (Signed)
 Pharmacy Patient Advocate Encounter  Received notification from [employer] that Prior Authorization for ELETRIPTAN  40MG  has been APPROVED from 9.17.25 to 9.17.26. Ran test claim, Copay is $4. This test claim was processed through [facility]- copay amounts may vary at other pharmacies due to pharmacy/plan contracts, or as the patient moves through the different stages of their insurance plan.   PA #/Case ID/Reference #: [id number]

## 2024-06-10 ENCOUNTER — Encounter: Payer: MEDICAID | Admitting: Family Medicine

## 2024-08-19 NOTE — Progress Notes (Unsigned)
 NEUROLOGY FOLLOW UP OFFICE NOTE  Ethan Williams [id number]  Assessment/Plan:   1  Migraine with aura, with status migrainosus, not intractable/ocular migraines 2  Cervical spinal stenosis 3. Left-sided occipital neuralgia   Migraine prevention:  Nortriptyline  10mg  at bedtime.  *** Migraine rescue:  eletriptan  40mg  ***.  Zofran  for nausea.   Limit use of pain relievers to no more than 9 days out of the month to prevent risk of rebound or medication-overuse headache. Keep headache diary Follow up 6 months.   Subjective:  Ethan Williams is a 63 year old right-handed Caucasian male with anxiety, depression who follows up for worsening migraines.  History supplemented by ED notes.   UPDATE: He had status migrainosus beginning in August 2025.  He was more active outside and it triggered headaches.  They became daily.  They are now requiring repeat doses of rizatriptan  to abort them.  He has been using up his rizatriptan .  He was diagnosed with COVID last week, which has made it worse.  Since then, he has had thumping in his left ear.  Seen in the ED on Monday.  CT head unremarkable and treated with headache cocktail.  It was ineffective, so they prescribed him hydrocodone .  When he was last seen on 9/10, he was prescribed a prednisone  taper ***.  He was also started on nortriptyline  at bedtime and eleptriptan for acute treatment.  ***  Current NSAIDS:  naproxen  500mg , hydrocodone -acetaminophen  (prescribed from ED) Current analgesics:  none Current triptans:  eletriptan  40mg  Current ergotamine:  none Current anti-emetic:  none Current muscle relaxants:  methocarbamol  500mg  PRN Current anti-anxiolytic:  none Current sleep aide:  none Current Antihypertensive medications:  none Current Antidepressant medications:  nortriptyline  10mg  at bedtime Current Anticonvulsant medications: none Current anti-CGRP:  none Current Vitamins/Herbal/Supplements:  MVI Current  Antihistamines/Decongestants:  none Other therapy:  none Hormone/birth control:  none   Caffeine :  Stopped coffee and smoking intake, which has helped.  Headache and neck pain improved.  Restarted and headaches/neck pain worse.   Diet:  Does not drink much water.  A little bit of soda. Exercise:  Not routine Depression:  no; Anxiety:  no Other pain: Has been dealing with low back pain radiating into left groin.  Saw his orthopedist.  Received cortisone injection which has helped. Sleep hygiene:  okay   HISTORY:  He started having migraines since 2013.  He starts with fatigue, slurred speech and visual aura with blurred vision, spots, crescent shapes, flashes and lines for 30 minutes followed by severe stabbing occipital headache (either side but usually left-sided) radiating to the front of head lasting a 1 to 2 hours with sumatriptan , that lingers for a couple of days.  Associated with photophobia and phonophobia but not really nausea or vomiting.  Residual headache for 2 to 3 days.  Occurs every 2 weeks.  Coughing aggravates it.  Resting in dark and quiet room helps.     MRI and MRA of brain from 12/14/2011 were normal. MRI of cervical spine without contrast from 08/19/2018 showed moderately large C6-7 disc extrusion resulting in moderate spinal stenosis and moderate left neural foraminal stenosis, C5-6 disc degeneration and left parcentral protrusion resulting in moderate spinal stenosis and severe left neural foraminal stenosis, and moderate right neural foraminal stenosis at C3-4 and C4-5.  Underwent ACDF 5-6 and 6-7 in 2020.  Still has residual numbness in left arm and sometimes perioral numbness and numbness in the jaw bilateral.  Due to worsening migraines, MRI of brain  with and without contrast was performed on 03/12/2021, which was unremarkable.   Past NSAIDS/steroids:  naproxen  Past analgesics:  Tylenol ; Fiorciet; tramadol  Past abortive triptans:  sumatriptan  tab, rizatriptan  10mg  Past  abortive ergotamine:  none Past muscle relaxants:  baclofen  Past anti-emetic:  none Past antihypertensive medications:  none Past antidepressant medications:  none Past anticonvulsant medications:  topiramate  (kidney stones), gabapentin  Past anti-CGRP:  Ubrelvy  Past vitamins/Herbal/Supplements:  none Other past therapies:  Coffee     Family history of headache:  no   History of concussion History of cervical spinal stenosis s/p fusion in February 2020.  PAST MEDICAL HISTORY: Past Medical History:  Diagnosis Date   Anginal pain    Anxiety    Arthritis    right shoulder (06/06/2012), finger joints   Daily headache    here lately they've been daily (06/06/2012), no current problems per patient 06/16/19   Depression    Diverticulitis    Dysrhythmia    palpitations (06/06/2012)   External hemorrhoid, bleeding    GERD (gastroesophageal reflux disease)    Heart murmur    as a child (06/06/2012), no problems   Hyperlipidemia    Kidney stone    Migraines    treated for them in the last 6 months (06/06/2012), none since 2013   Pneumonia 2012; 06/06/2012   Seasonal allergies    outdoor; pollen, grass (06/06/2012)   Shortness of breath 06/06/2012   at rest; lying down; w/exertion, no current problems per patient 10/12/200   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 30 tablet 0   aspirin EC 81 MG tablet Take 81 mg by mouth daily. Swallow whole.     calcium  carbonate (TUMS - DOSED IN MG ELEMENTAL CALCIUM ) 500 MG chewable tablet Chew 1 tablet by mouth daily as needed for indigestion or heartburn.     eletriptan  (RELPAX ) 40 MG tablet Take 1 tablet (40 mg total) by mouth as needed for migraine or headache. May repeat in 2 hours if headache  persists or recurs.  Maximum 2 tablets in 24 hours. 10 tablet 5   famotidine  (PEPCID ) 20 MG tablet Take 1 tablet (20 mg total) by mouth 2 (two) times daily. 180 tablet 3   methocarbamol  (ROBAXIN ) 500 MG tablet Take 1 tablet (500 mg total) by mouth every 8 (eight) hours as needed for muscle spasms. 30 tablet 0   naproxen  (NAPROSYN ) 500 MG tablet Take 1 tablet (500 mg total) by mouth 2 (two) times daily. 30 tablet 0   nortriptyline  (PAMELOR ) 10 MG capsule Take 1 capsule (10 mg total) by mouth at bedtime. 30 capsule 5   ondansetron  (ZOFRAN -ODT) 4 MG disintegrating tablet Take 1 tablet (4 mg total) by mouth every 8 (eight) hours as needed. 20 tablet 5   polyethylene glycol powder (GLYCOLAX /MIRALAX ) 17 GM/SCOOP powder Take 17 g by mouth as needed.     predniSONE  (DELTASONE ) 10 MG tablet Take 60mg  on day 1, then 50mg  on day 2, then 40mg  on day 3, then 30mg  on day 4, then 20mg  on day 5, then 10mg  on day 6 21 tablet 0   rizatriptan  (MAXALT ) 10 MG tablet Take 1 tablet (10 mg total) by mouth as needed for migraine. May repeat in 2 hours if needed  10 tablet 0   tamsulosin  (FLOMAX ) 0.4 MG CAPS capsule Take 1 capsule (0.4 mg total) by mouth daily. 14 capsule 0   No current facility-administered medications on file prior to visit.    ALLERGIES: No Known Allergies  FAMILY HISTORY: Family History  Problem Relation Age of Onset   Prostate cancer Father    Colon cancer Neg Hx    Rectal cancer Neg Hx    Esophageal cancer Neg Hx    Inflammatory bowel disease Neg Hx    Liver disease Neg Hx    Pancreatic cancer Neg Hx       Objective:  *** General: No acute distress.  Patient appears well-groomed.   ***     Juliene Dunnings, DO  CC: Elna Redo, MD

## 2024-08-20 ENCOUNTER — Ambulatory Visit: Payer: MEDICAID | Admitting: Neurology

## 2024-08-20 ENCOUNTER — Encounter: Payer: Self-pay | Admitting: Neurology

## 2024-08-20 VITALS — BP 136/83 | HR 97 | Ht 71.0 in | Wt 200.4 lb

## 2024-08-20 DIAGNOSIS — G43101 Migraine with aura, not intractable, with status migrainosus: Secondary | ICD-10-CM

## 2024-08-20 DIAGNOSIS — M5481 Occipital neuralgia: Secondary | ICD-10-CM | POA: Diagnosis not present

## 2024-08-20 MED ORDER — NORTRIPTYLINE HCL 10 MG PO CAPS: 10.0000 mg | ORAL_CAPSULE | Freq: Every day | ORAL | 5 refills | Status: AC

## 2024-08-20 MED ORDER — ONDANSETRON 4 MG PO TBDP: 4.0000 mg | ORAL_TABLET | Freq: Three times a day (TID) | ORAL | 5 refills | Status: AC | PRN

## 2024-08-20 MED ORDER — ELETRIPTAN HYDROBROMIDE 40 MG PO TABS: 40.0000 mg | ORAL_TABLET | ORAL | 5 refills | Status: AC | PRN

## 2024-08-20 NOTE — Patient Instructions (Signed)
 Continue nortriptyline  10mg  at bedtime Eletriptan  and Zofran  as needed Limit use of pain relievers to no more than 9 days out of the month to prevent risk of rebound or medication-overuse headache. Keep headache diary Follow up  6 months.

## 2024-10-06 ENCOUNTER — Encounter: Payer: Self-pay | Admitting: Family Medicine

## 2025-02-25 ENCOUNTER — Ambulatory Visit: Payer: Self-pay | Admitting: Neurology

## 2025-03-02 ENCOUNTER — Ambulatory Visit: Payer: MEDICAID | Admitting: Neurology
